# Patient Record
Sex: Male | Born: 1960 | Race: White | Hispanic: No | Marital: Married | Smoking: Never smoker
Health system: Southern US, Community
[De-identification: ages and names within clinical notes are randomized; demographics above are authoritative.]

## PROBLEM LIST (undated history)

## (undated) DIAGNOSIS — I5042 Chronic combined systolic (congestive) and diastolic (congestive) heart failure: Secondary | ICD-10-CM

## (undated) DIAGNOSIS — M1990 Unspecified osteoarthritis, unspecified site: Secondary | ICD-10-CM

## (undated) DIAGNOSIS — L409 Psoriasis, unspecified: Secondary | ICD-10-CM

## (undated) DIAGNOSIS — E7800 Pure hypercholesterolemia, unspecified: Secondary | ICD-10-CM

## (undated) DIAGNOSIS — I509 Heart failure, unspecified: Secondary | ICD-10-CM

## (undated) DIAGNOSIS — I4819 Other persistent atrial fibrillation: Secondary | ICD-10-CM

## (undated) DIAGNOSIS — E669 Obesity, unspecified: Secondary | ICD-10-CM

## (undated) DIAGNOSIS — I480 Paroxysmal atrial fibrillation: Secondary | ICD-10-CM

## (undated) DIAGNOSIS — Z8679 Personal history of other diseases of the circulatory system: Secondary | ICD-10-CM

## (undated) DIAGNOSIS — M109 Gout, unspecified: Secondary | ICD-10-CM

## (undated) DIAGNOSIS — I340 Nonrheumatic mitral (valve) insufficiency: Secondary | ICD-10-CM

## (undated) DIAGNOSIS — G4733 Obstructive sleep apnea (adult) (pediatric): Secondary | ICD-10-CM

## (undated) DIAGNOSIS — F32A Depression, unspecified: Secondary | ICD-10-CM

## (undated) DIAGNOSIS — I10 Essential (primary) hypertension: Secondary | ICD-10-CM

## (undated) DIAGNOSIS — IMO0001 Reserved for inherently not codable concepts without codable children: Secondary | ICD-10-CM

## (undated) DIAGNOSIS — K7460 Unspecified cirrhosis of liver: Secondary | ICD-10-CM

## (undated) DIAGNOSIS — G459 Transient cerebral ischemic attack, unspecified: Secondary | ICD-10-CM

## (undated) DIAGNOSIS — K219 Gastro-esophageal reflux disease without esophagitis: Secondary | ICD-10-CM

## (undated) DIAGNOSIS — F329 Major depressive disorder, single episode, unspecified: Secondary | ICD-10-CM

## (undated) DIAGNOSIS — L509 Urticaria, unspecified: Secondary | ICD-10-CM

## (undated) DIAGNOSIS — Z9889 Other specified postprocedural states: Secondary | ICD-10-CM

## (undated) DIAGNOSIS — R55 Syncope and collapse: Secondary | ICD-10-CM

## (undated) DIAGNOSIS — D696 Thrombocytopenia, unspecified: Secondary | ICD-10-CM

## (undated) HISTORY — DX: Pure hypercholesterolemia, unspecified: E78.00

## (undated) HISTORY — PX: TENDON REPAIR: SHX5111

## (undated) HISTORY — DX: Gout, unspecified: M10.9

## (undated) HISTORY — PX: KNEE ARTHROSCOPY: SHX127

## (undated) HISTORY — DX: Essential (primary) hypertension: I10

## (undated) HISTORY — DX: Syncope and collapse: R55

## (undated) HISTORY — DX: Gastro-esophageal reflux disease without esophagitis: K21.9

## (undated) HISTORY — DX: Unspecified osteoarthritis, unspecified site: M19.90

## (undated) HISTORY — DX: Transient cerebral ischemic attack, unspecified: G45.9

## (undated) HISTORY — DX: Psoriasis, unspecified: L40.9

## (undated) HISTORY — DX: Chronic combined systolic (congestive) and diastolic (congestive) heart failure: I50.42

## (undated) HISTORY — DX: Unspecified cirrhosis of liver: K74.60

## (undated) HISTORY — DX: Thrombocytopenia, unspecified: D69.6

## (undated) HISTORY — DX: Major depressive disorder, single episode, unspecified: F32.9

## (undated) HISTORY — PX: RECONSTRUCTION OF NOSE: SHX2301

## (undated) HISTORY — DX: Paroxysmal atrial fibrillation: I48.0

## (undated) HISTORY — DX: Other persistent atrial fibrillation: I48.19

## (undated) HISTORY — PX: NOSE SURGERY: SHX723

## (undated) HISTORY — DX: Depression, unspecified: F32.A

## (undated) HISTORY — DX: Obstructive sleep apnea (adult) (pediatric): G47.33

## (undated) HISTORY — DX: Urticaria, unspecified: L50.9

## (undated) HISTORY — DX: Obesity, unspecified: E66.9

## (undated) HISTORY — DX: Nonrheumatic mitral (valve) insufficiency: I34.0

---

## 1998-03-21 ENCOUNTER — Ambulatory Visit (HOSPITAL_BASED_OUTPATIENT_CLINIC_OR_DEPARTMENT_OTHER): Admission: RE | Admit: 1998-03-21 | Discharge: 1998-03-21 | Payer: Self-pay | Admitting: *Deleted

## 2009-07-01 HISTORY — PX: CHOLECYSTECTOMY: SHX55

## 2011-04-25 LAB — PULMONARY FUNCTION TEST

## 2012-02-24 ENCOUNTER — Ambulatory Visit (INDEPENDENT_AMBULATORY_CARE_PROVIDER_SITE_OTHER): Payer: BC Managed Care – PPO | Admitting: Internal Medicine

## 2012-02-24 ENCOUNTER — Encounter: Payer: Self-pay | Admitting: *Deleted

## 2012-02-24 ENCOUNTER — Encounter: Payer: Self-pay | Admitting: Internal Medicine

## 2012-02-24 VITALS — BP 128/68 | HR 64 | Ht 75.0 in | Wt 306.0 lb

## 2012-02-24 DIAGNOSIS — R7309 Other abnormal glucose: Secondary | ICD-10-CM

## 2012-02-24 DIAGNOSIS — R739 Hyperglycemia, unspecified: Secondary | ICD-10-CM

## 2012-02-24 DIAGNOSIS — M109 Gout, unspecified: Secondary | ICD-10-CM | POA: Insufficient documentation

## 2012-02-24 DIAGNOSIS — R0602 Shortness of breath: Secondary | ICD-10-CM

## 2012-02-24 DIAGNOSIS — I480 Paroxysmal atrial fibrillation: Secondary | ICD-10-CM

## 2012-02-24 DIAGNOSIS — F411 Generalized anxiety disorder: Secondary | ICD-10-CM

## 2012-02-24 DIAGNOSIS — G4733 Obstructive sleep apnea (adult) (pediatric): Secondary | ICD-10-CM

## 2012-02-24 DIAGNOSIS — D696 Thrombocytopenia, unspecified: Secondary | ICD-10-CM | POA: Insufficient documentation

## 2012-02-24 DIAGNOSIS — E669 Obesity, unspecified: Secondary | ICD-10-CM

## 2012-02-24 DIAGNOSIS — I4891 Unspecified atrial fibrillation: Secondary | ICD-10-CM

## 2012-02-24 DIAGNOSIS — G4730 Sleep apnea, unspecified: Secondary | ICD-10-CM | POA: Insufficient documentation

## 2012-02-24 DIAGNOSIS — L509 Urticaria, unspecified: Secondary | ICD-10-CM

## 2012-02-24 DIAGNOSIS — E7800 Pure hypercholesterolemia, unspecified: Secondary | ICD-10-CM

## 2012-02-24 DIAGNOSIS — G4710 Hypersomnia, unspecified: Secondary | ICD-10-CM

## 2012-02-24 DIAGNOSIS — F419 Anxiety disorder, unspecified: Secondary | ICD-10-CM

## 2012-02-24 NOTE — Patient Instructions (Addendum)

## 2012-02-24 NOTE — Progress Notes (Signed)
 Primary Care Physician: Mike Duran Referring Physician:  Dr Chung ([facility])   Joseph Garrett is a 50 y.o. male with a h/o paroxysmal atrial fibrillation who presents today for EP consultation.  He reports initially being diagnosed with atrial fibrillation 15 years ago.  He reports that at that time, he developed irregular tachypalpitations of abrupt onset.  He presented to [facility] and was found to have afib with RVR.  He declined coumadin and was therefore not cardioverted.  He spontaneously returned to sinus rhythm after several days.  Since that time, he has had increasing frequency and duration of atrial fibrillation (initially a couple times per year).  He finds that stress (emotional or physical) have been triggers for his afib.  He was subsequently evaluated by Dr Torelli and was treated with digoxin and coumadin.  He reports requiring several cardioversions.  He was subsequently placed on flecainide (which he has taken for at least 3 years).  He finds that flecainide will terminate his atrial fibrillation episodes.  Though episodes are less frequent, he continues to have afib.  He has switched from coumadin to xarelto and is quite pleased with this change.  During atrial fibrillation, he reports symptoms of palpitations, fatigue, SOB, and presyncope.  Occasionally, he has to miss work due to afib.  When episodes occur, he frequently has to lay down.  Typically episodes last less than 6 hours.  He will occasionally take an additional flecainide if he is in afib.  In May 2012, he had L facial weakness and was diagnosed with a TIA.   Today, he denies symptoms of  chest pain,  orthopnea, PND, lower extremity edema,  syncope, or neurologic sequela. The patient is tolerating medications without difficulties and is otherwise without complaint today.   Past Medical History  Diagnosis Date  . Paroxysmal atrial fibrillation   . TIA (transient ischemic attack)   . Hypercholesteremia   .  Thrombocytopenia   . OSA (obstructive sleep apnea)     mild, uses CPAP  . Hypertension   . Obesity   . Depression   . Gout   . Hives   . GERD (gastroesophageal reflux disease)   . Cirrhosis     liver scarring on biopsy, presumed to be due to methotrexate  . DJD (degenerative joint disease)   . Psoriasis    Past Surgical History  Procedure Date  . Cholecystectomy 2011    Current Outpatient Prescriptions  Medication Sig Dispense Refill  . amLODipine-olmesartan (AZOR) 5-20 MG per tablet Take 1 tablet by mouth daily.      . beclomethasone (QVAR) 40 MCG/ACT inhaler Inhale 2 puffs into the lungs 2 (two) times daily.      . Febuxostat (ULORIC) 80 MG TABS Take 1 tablet by mouth daily.      . flecainide (TAMBOCOR) 100 MG tablet 1 TAB PO BID      . Omega-3 Fatty Acids (FISH OIL PO) Take 1 tablet by mouth daily.      . Rivaroxaban (XARELTO) 20 MG TABS Take 10 mg by mouth daily.        Allergies not on file  History   Social History  . Marital Status: Married    Spouse Name: N/A    Number of Children: N/A  . Years of Education: N/A   Occupational History  . Not on file.   Social History Main Topics  . Smoking status: Never Smoker   . Smokeless tobacco: Not on file  . Alcohol   Use: No  . Drug Use: No  . Sexually Active: Not on file   Other Topics Concern  . Not on file   Social History Narrative   Lives in [place] with spouse.  3 children are healthy.  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    Family History  Problem Relation Age of Onset  . Heart disease    . Diabetes      ROS- All systems are reviewed and negative except as per the HPI above.  He has chronic decline in hearing and vision.  Physical Exam: Filed Vitals:   02/24/12 1224  BP: 128/68  Pulse: 64  Height: 6' 3" (1.905 m)  Weight: 306 lb (138.801 kg)    GEN- The patient is well appearing, alert and oriented x 3 today.   Head- normocephalic, atraumatic Eyes-  Sclera clear,  conjunctiva pink Ears- hearing intact Oropharynx- clear Neck- supple, no JVP Lymph- no cervical lymphadenopathy Lungs- Clear to ausculation bilaterally, normal work of breathing Heart- Regular rate and rhythm, no murmurs, rubs or gallops, PMI not laterally displaced GI- soft, NT, ND, + BS Extremities- no clubbing, cyanosis, or edema MS- no significant deformity or atrophy Skin- no rash or lesion Psych- euthymic mood, full affect Neuro- strength and sensation are intact  Echo 11/05/11- EF 55-60%, LA size 35mm, trace MR, mild LVH GXT myoview 11/19/11- submaximal exercise, EF 47%, low risk study without obvious ischemia  ekg today reveals sinus rhythm 64 bpm, poor R wave progression, otherwise normal ekg  Assessment and Plan:  

## 2012-02-25 ENCOUNTER — Other Ambulatory Visit: Payer: Self-pay | Admitting: *Deleted

## 2012-02-25 ENCOUNTER — Encounter: Payer: Self-pay | Admitting: *Deleted

## 2012-02-25 DIAGNOSIS — I4891 Unspecified atrial fibrillation: Secondary | ICD-10-CM

## 2012-02-25 DIAGNOSIS — Z01812 Encounter for preprocedural laboratory examination: Secondary | ICD-10-CM

## 2012-03-02 DIAGNOSIS — I4819 Other persistent atrial fibrillation: Secondary | ICD-10-CM | POA: Insufficient documentation

## 2012-03-02 HISTORY — DX: Other persistent atrial fibrillation: I48.19

## 2012-03-02 NOTE — Assessment & Plan Note (Signed)
The patient has symptomatic paroxysmal atrial fibrillation.  He has failed medical therapy with flecainide.  He is appropriately anticoagulated with xarelto. Therapeutic strategies for afib including medicine and ablation were discussed in detail with the patient today. Risk, benefits, and alternatives to EP study and radiofrequency ablation for afib were also discussed in detail today. These risks include but are not limited to stroke, bleeding, vascular damage, tamponade, perforation, damage to the esophagus, lungs, and other structures, pulmonary vein stenosis, worsening renal function, and death. The patient understands these risk and wishes to proceed.  We will therefore proceed with catheter ablation at the next available time.

## 2012-03-02 NOTE — Assessment & Plan Note (Signed)
Compliance with CPAP encouraged 

## 2012-03-02 NOTE — Assessment & Plan Note (Signed)
Weight loss is advised 

## 2012-03-03 ENCOUNTER — Encounter (HOSPITAL_COMMUNITY): Payer: Self-pay | Admitting: Respiratory Therapy

## 2012-03-10 ENCOUNTER — Other Ambulatory Visit: Payer: BC Managed Care – PPO

## 2012-03-16 ENCOUNTER — Encounter (HOSPITAL_COMMUNITY)
Admission: RE | Disposition: A | Discharge status: Home / Self Care | Payer: Self-pay | Source: Ambulatory Visit | Attending: Internal Medicine

## 2012-03-16 ENCOUNTER — Encounter (HOSPITAL_COMMUNITY): Payer: Self-pay | Admitting: *Deleted

## 2012-03-16 ENCOUNTER — Encounter: Payer: Self-pay | Admitting: Internal Medicine

## 2012-03-16 ENCOUNTER — Ambulatory Visit (HOSPITAL_COMMUNITY)
Admission: RE | Admit: 2012-03-16 | Discharge: 2012-03-16 | Disposition: A | Discharge status: Home / Self Care | Payer: BC Managed Care – PPO | Source: Ambulatory Visit | Attending: Internal Medicine | Admitting: Internal Medicine

## 2012-03-16 DIAGNOSIS — I4891 Unspecified atrial fibrillation: Secondary | ICD-10-CM

## 2012-03-16 DIAGNOSIS — I480 Paroxysmal atrial fibrillation: Secondary | ICD-10-CM

## 2012-03-16 HISTORY — PX: TEE WITHOUT CARDIOVERSION: SHX5443

## 2012-03-16 SURGERY — ECHOCARDIOGRAM, TRANSESOPHAGEAL
Anesthesia: Moderate Sedation

## 2012-03-16 MED ORDER — LIDOCAINE VISCOUS 2 % MT SOLN
OROMUCOSAL | Status: AC
  Filled 2012-03-16: qty 15

## 2012-03-16 MED ORDER — LIDOCAINE VISCOUS 2 % MT SOLN
OROMUCOSAL | Status: DC | PRN
  Administered 2012-03-16: 10 mL via OROMUCOSAL

## 2012-03-16 MED ORDER — MIDAZOLAM HCL 10 MG/2ML IJ SOLN
INTRAMUSCULAR | Status: DC | PRN
  Administered 2012-03-16 (×2): 2 mg via INTRAVENOUS
  Administered 2012-03-16: 1 mg via INTRAVENOUS

## 2012-03-16 MED ORDER — MIDAZOLAM HCL 5 MG/ML IJ SOLN
INTRAMUSCULAR | Status: AC
  Filled 2012-03-16: qty 2

## 2012-03-16 MED ORDER — FENTANYL CITRATE 0.05 MG/ML IJ SOLN
INTRAMUSCULAR | Status: AC
  Filled 2012-03-16: qty 2

## 2012-03-16 MED ORDER — SODIUM CHLORIDE 0.9 % IV SOLN: INTRAVENOUS | Status: DC

## 2012-03-16 MED ORDER — FENTANYL CITRATE 0.05 MG/ML IJ SOLN
INTRAMUSCULAR | Status: DC | PRN
  Administered 2012-03-16 (×3): 25 ug via INTRAVENOUS

## 2012-03-16 NOTE — Op Note (Signed)
No LA thrombus.  Full report to follow.

## 2012-03-16 NOTE — Progress Notes (Signed)
  Echocardiogram Echocardiogram Transesophageal has been performed.  Sophya Vanblarcom FRANCES 03/16/2012, 11:02 AM

## 2012-03-16 NOTE — H&P (View-Only) (Signed)
Primary Care Physician: Arnette Felts Referring Physician:  Dr Dory [facility])   Joseph Garrett is a 51 y.o. male with a h/o paroxysmal atrial fibrillation who presents today for EP consultation.  He reports initially being diagnosed with atrial fibrillation 15 years ago.  He reports that at that time, he developed irregular tachypalpitations of abrupt onset.  He presented to [facility] and was found to have afib with RVR.  He declined coumadin and was therefore not cardioverted.  He spontaneously returned to sinus rhythm after several days.  Since that time, he has had increasing frequency and duration of atrial fibrillation (initially a couple times per year).  He finds that stress (emotional or physical) have been triggers for his afib.  He was subsequently evaluated by Dr Shelva Majestic and was treated with digoxin and coumadin.  He reports requiring several cardioversions.  He was subsequently placed on flecainide (which he has taken for at least 3 years).  He finds that flecainide will terminate his atrial fibrillation episodes.  Though episodes are less frequent, he continues to have afib.  He has switched from coumadin to xarelto and is quite pleased with this change.  During atrial fibrillation, he reports symptoms of palpitations, fatigue, SOB, and presyncope.  Occasionally, he has to miss work due to [employer].  When episodes occur, he frequently has to lay down.  Typically episodes last less than 6 hours.  He will occasionally take an additional flecainide if he is in afib.  In May 2012, he had L facial weakness and was diagnosed with a TIA.   Today, he denies symptoms of  chest pain,  orthopnea, PND, lower extremity edema,  syncope, or neurologic sequela. The patient is tolerating medications without difficulties and is otherwise without complaint today.   Past Medical History  Diagnosis Date  . Paroxysmal atrial fibrillation   . TIA (transient ischemic attack)   . Hypercholesteremia   .  Thrombocytopenia   . OSA (obstructive sleep apnea)     mild, uses CPAP  . Hypertension   . Obesity   . Depression   . Gout   . Hives   . GERD (gastroesophageal reflux disease)   . Cirrhosis     liver scarring on biopsy, presumed to be due to methotrexate  . DJD (degenerative joint disease)   . Psoriasis    Past Surgical History  Procedure Date  . Cholecystectomy 2011    Current Outpatient Prescriptions  Medication Sig Dispense Refill  . amLODipine-olmesartan (AZOR) 5-20 MG per tablet Take 1 tablet by mouth daily.      . beclomethasone (QVAR) 40 MCG/ACT inhaler Inhale 2 puffs into the lungs 2 (two) times daily.      . Febuxostat (ULORIC) 80 MG TABS Take 1 tablet by mouth daily.      . flecainide (TAMBOCOR) 100 MG tablet 1 TAB PO BID      . Omega-3 Fatty Acids (FISH OIL PO) Take 1 tablet by mouth daily.      . Rivaroxaban (XARELTO) 20 MG TABS Take 10 mg by mouth daily.        Allergies not on file  History   Social History  . Marital Status: Married    Spouse Name: N/A    Number of Children: N/A  . Years of Education: N/A   Occupational History  . Not on file.   Social History Main Topics  . Smoking status: Never Smoker   . Smokeless tobacco: Not on file  . Alcohol  Use: No  . Drug Use: No  . Sexually Active: Not on file   Other Topics Concern  . Not on file   Social History Narrative   Lives in [place] with spouse.  3 children are healthy.  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    Family History  Problem Relation Age of Onset  . Heart disease    . Diabetes      ROS- All systems are reviewed and negative except as per the HPI above.  He has chronic decline in hearing and vision.  Physical Exam: Filed Vitals:   02/24/12 1224  BP: 128/68  Pulse: 64  Height: 6\' 3"  (1.905 m)  Weight: 306 lb (138.801 kg)    GEN- The patient is well appearing, alert and oriented x 3 today.   Head- normocephalic, atraumatic Eyes-  Sclera clear,  conjunctiva pink Ears- hearing intact Oropharynx- clear Neck- supple, no JVP Lymph- no cervical lymphadenopathy Lungs- Clear to ausculation bilaterally, normal work of breathing Heart- Regular rate and rhythm, no murmurs, rubs or gallops, PMI not laterally displaced GI- soft, NT, ND, + BS Extremities- no clubbing, cyanosis, or edema MS- no significant deformity or atrophy Skin- no rash or lesion Psych- euthymic mood, full affect Neuro- strength and sensation are intact  Echo 11/05/11- EF 55-60%, LA size 35mm, trace MR, mild LVH GXT myoview 11/19/11- submaximal exercise, EF 47%, low risk study without obvious ischemia  ekg today reveals sinus rhythm 64 bpm, poor R wave progression, otherwise normal ekg  Assessment and Plan:

## 2012-03-16 NOTE — Interval H&P Note (Signed)
History and Physical Interval Note:  03/16/2012 10:20 AM  Joseph Garrett  has presented today for surgery, with the diagnosis of afib  The various methods of treatment have been discussed with the patient and family. After consideration of risks, benefits and other options for treatment, the patient has consented to  Procedure(s) (LRB) with comments: TRANSESOPHAGEAL ECHOCARDIOGRAM (TEE) (N/A) as a surgical intervention .  The patient's history has been reviewed, patient examined, no change in status, stable for surgery.  I have reviewed the patient's chart and labs.  Questions were answered to the patient's satisfaction.     [name]

## 2012-03-17 ENCOUNTER — Ambulatory Visit (HOSPITAL_COMMUNITY): Payer: BC Managed Care – PPO | Admitting: Anesthesiology

## 2012-03-17 ENCOUNTER — Encounter (HOSPITAL_COMMUNITY): Payer: Self-pay | Admitting: Anesthesiology

## 2012-03-17 ENCOUNTER — Encounter (HOSPITAL_COMMUNITY)
Admission: RE | Disposition: A | Discharge status: Home / Self Care | Payer: Self-pay | Source: Ambulatory Visit | Attending: Internal Medicine

## 2012-03-17 ENCOUNTER — Encounter (HOSPITAL_COMMUNITY): Payer: Self-pay | Admitting: *Deleted

## 2012-03-17 ENCOUNTER — Ambulatory Visit (HOSPITAL_COMMUNITY)
Admission: RE | Admit: 2012-03-17 | Discharge: 2012-03-18 | Disposition: A | Discharge status: Home / Self Care | Payer: BC Managed Care – PPO | Source: Ambulatory Visit | Attending: Internal Medicine | Admitting: Internal Medicine

## 2012-03-17 DIAGNOSIS — E7800 Pure hypercholesterolemia, unspecified: Secondary | ICD-10-CM | POA: Insufficient documentation

## 2012-03-17 DIAGNOSIS — Z79899 Other long term (current) drug therapy: Secondary | ICD-10-CM | POA: Insufficient documentation

## 2012-03-17 DIAGNOSIS — G4733 Obstructive sleep apnea (adult) (pediatric): Secondary | ICD-10-CM | POA: Insufficient documentation

## 2012-03-17 DIAGNOSIS — K219 Gastro-esophageal reflux disease without esophagitis: Secondary | ICD-10-CM | POA: Insufficient documentation

## 2012-03-17 DIAGNOSIS — I480 Paroxysmal atrial fibrillation: Secondary | ICD-10-CM

## 2012-03-17 DIAGNOSIS — Z8673 Personal history of transient ischemic attack (TIA), and cerebral infarction without residual deficits: Secondary | ICD-10-CM | POA: Insufficient documentation

## 2012-03-17 DIAGNOSIS — G4730 Sleep apnea, unspecified: Secondary | ICD-10-CM | POA: Diagnosis present

## 2012-03-17 DIAGNOSIS — I4892 Unspecified atrial flutter: Secondary | ICD-10-CM

## 2012-03-17 DIAGNOSIS — E669 Obesity, unspecified: Secondary | ICD-10-CM | POA: Insufficient documentation

## 2012-03-17 DIAGNOSIS — I4891 Unspecified atrial fibrillation: Secondary | ICD-10-CM | POA: Insufficient documentation

## 2012-03-17 DIAGNOSIS — L408 Other psoriasis: Secondary | ICD-10-CM | POA: Insufficient documentation

## 2012-03-17 DIAGNOSIS — Z23 Encounter for immunization: Secondary | ICD-10-CM | POA: Insufficient documentation

## 2012-03-17 DIAGNOSIS — I4819 Other persistent atrial fibrillation: Secondary | ICD-10-CM | POA: Diagnosis present

## 2012-03-17 DIAGNOSIS — K7460 Unspecified cirrhosis of liver: Secondary | ICD-10-CM | POA: Insufficient documentation

## 2012-03-17 HISTORY — PX: ATRIAL FIBRILLATION ABLATION: SHX5732

## 2012-03-17 HISTORY — PX: ATRIAL FIBRILLATION ABLATION: SHX5456

## 2012-03-17 LAB — BASIC METABOLIC PANEL
BUN: 21 mg/dL (ref 6–23)
CO2: 28 mEq/L (ref 19–32)
Calcium: 9.8 mg/dL (ref 8.4–10.5)
Chloride: 105 mEq/L (ref 96–112)
Creatinine, Ser: 0.94 mg/dL (ref 0.50–1.35)
Glucose, Bld: 110 mg/dL — ABNORMAL HIGH (ref 70–99)

## 2012-03-17 LAB — CBC
HCT: 43 % (ref 39.0–52.0)
Hemoglobin: 14.9 g/dL (ref 13.0–17.0)
MCH: 30.8 pg (ref 26.0–34.0)
MCV: 89 fL (ref 78.0–100.0)
RBC: 4.83 MIL/uL (ref 4.22–5.81)

## 2012-03-17 LAB — POCT ACTIVATED CLOTTING TIME
Activated Clotting Time: 319 seconds
Activated Clotting Time: 180 seconds

## 2012-03-17 SURGERY — ATRIAL FIBRILLATION ABLATION
Anesthesia: Monitor Anesthesia Care

## 2012-03-17 MED ORDER — FENTANYL CITRATE 0.05 MG/ML IJ SOLN
INTRAMUSCULAR | Status: DC | PRN
  Administered 2012-03-17: 25 ug via INTRAVENOUS
  Administered 2012-03-17: 50 ug via INTRAVENOUS
  Administered 2012-03-17 (×2): 25 ug via INTRAVENOUS
  Administered 2012-03-17: 100 ug via INTRAVENOUS
  Administered 2012-03-17 (×3): 25 ug via INTRAVENOUS

## 2012-03-17 MED ORDER — INFLUENZA VIRUS VACC SPLIT PF IM SUSP
0.5000 mL | INTRAMUSCULAR | Status: AC
  Administered 2012-03-18: 0.5 mL via INTRAMUSCULAR
  Filled 2012-03-17: qty 0.5

## 2012-03-17 MED ORDER — SODIUM CHLORIDE 0.9 % IJ SOLN: 3.0000 mL | INTRAMUSCULAR | Status: DC | PRN

## 2012-03-17 MED ORDER — HYDROCODONE-ACETAMINOPHEN 5-325 MG PO TABS
1.0000 | ORAL_TABLET | ORAL | Status: DC | PRN
  Administered 2012-03-17: 2 via ORAL
  Filled 2012-03-17: qty 2

## 2012-03-17 MED ORDER — SODIUM CHLORIDE 0.9 % IV SOLN: 250.0000 mL | INTRAVENOUS | Status: DC | PRN

## 2012-03-17 MED ORDER — ACETAMINOPHEN 325 MG PO TABS: 650.0000 mg | ORAL_TABLET | ORAL | Status: DC | PRN

## 2012-03-17 MED ORDER — PNEUMOCOCCAL VAC POLYVALENT 25 MCG/0.5ML IJ INJ
0.5000 mL | INJECTION | INTRAMUSCULAR | Status: AC
  Administered 2012-03-18: 0.5 mL via INTRAMUSCULAR
  Filled 2012-03-17: qty 0.5

## 2012-03-17 MED ORDER — ONDANSETRON HCL 4 MG/2ML IJ SOLN: 4.0000 mg | Freq: Four times a day (QID) | INTRAMUSCULAR | Status: DC | PRN

## 2012-03-17 MED ORDER — PROTAMINE SULFATE 10 MG/ML IV SOLN
INTRAVENOUS | Status: DC | PRN
  Administered 2012-03-17: 30 mg via INTRAVENOUS

## 2012-03-17 MED ORDER — BUPIVACAINE HCL (PF) 0.25 % IJ SOLN
INTRAMUSCULAR | Status: AC
  Filled 2012-03-17: qty 30

## 2012-03-17 MED ORDER — HEPARIN SODIUM (PORCINE) 1000 UNIT/ML IJ SOLN
INTRAMUSCULAR | Status: AC
  Filled 2012-03-17: qty 1

## 2012-03-17 MED ORDER — PROPOFOL INFUSION 10 MG/ML OPTIME
INTRAVENOUS | Status: DC | PRN
  Administered 2012-03-17: 75 ug/kg/min via INTRAVENOUS

## 2012-03-17 MED ORDER — LACTATED RINGERS IV SOLN
INTRAVENOUS | Status: DC | PRN
  Administered 2012-03-17: 07:00:00 via INTRAVENOUS

## 2012-03-17 MED ORDER — LIDOCAINE HCL (CARDIAC) 20 MG/ML IV SOLN
INTRAVENOUS | Status: DC | PRN
  Administered 2012-03-17: 50 mg via INTRAVENOUS

## 2012-03-17 MED ORDER — HEPARIN SODIUM (PORCINE) 1000 UNIT/ML IJ SOLN
INTRAMUSCULAR | Status: DC | PRN
  Administered 2012-03-17 (×2): 6000 [IU] via INTRAVENOUS

## 2012-03-17 MED ORDER — HYDROMORPHONE HCL PF 2 MG/ML IJ SOLN: 0.2500 mg | INTRAMUSCULAR | Status: DC | PRN

## 2012-03-17 MED ORDER — MIDAZOLAM HCL 5 MG/5ML IJ SOLN
INTRAMUSCULAR | Status: DC | PRN
  Administered 2012-03-17: 2 mg via INTRAVENOUS

## 2012-03-17 MED ORDER — SODIUM CHLORIDE 0.9 % IJ SOLN
3.0000 mL | Freq: Two times a day (BID) | INTRAMUSCULAR | Status: DC
  Administered 2012-03-17: 3 mL via INTRAVENOUS
  Administered 2012-03-17: 6 mL via INTRAVENOUS

## 2012-03-17 MED ORDER — RIVAROXABAN 20 MG PO TABS
20.0000 mg | ORAL_TABLET | Freq: Every day | ORAL | Status: DC
  Administered 2012-03-17: 20 mg via ORAL
  Filled 2012-03-17 (×2): qty 1

## 2012-03-17 MED ORDER — PROPOFOL 10 MG/ML IV BOLUS
INTRAVENOUS | Status: DC | PRN
  Administered 2012-03-17 (×2): 30 mg via INTRAVENOUS

## 2012-03-17 MED ORDER — SODIUM CHLORIDE 0.9 % IV SOLN: INTRAVENOUS | Status: DC

## 2012-03-17 MED ORDER — HYDROXYUREA 500 MG PO CAPS
ORAL_CAPSULE | ORAL | Status: AC
  Filled 2012-03-17: qty 1

## 2012-03-17 NOTE — Progress Notes (Signed)
Utilization Review Completed.Joseph Garrett T9/17/2013

## 2012-03-17 NOTE — Op Note (Signed)
SURGEON:  Hillis Range, MD  PREPROCEDURE DIAGNOSES: 1. Paroxysmal atrial fibrillation.  POSTPROCEDURE DIAGNOSES: 1. Paroxysmal  atrial fibrillation. 2. Right atrial flutter  PROCEDURES: 1. Comprehensive electrophysiologic study. 2. Coronary sinus pacing and recording. 3. Three-dimensional mapping of atrial fibrillation with additional mapping of a second discrete atrial focus (atrial flutter) 4. Ablation of atrial fibrillation with additional ablation of a second discrete atrial focus (atrial flutter) 5. Intracardiac echocardiography. 6. Transseptal puncture of an intact septum. 7. Rotational Angiography with processing at an independent workstation 8. Arrhythmia induction with pacing with isuprel infusion 9. External cardioversion  INTRODUCTION:  Joseph Garrett is a 51 y.o. male with a history of paroxysmal atrial fibrillation who now presents for EP study and radiofrequency ablation.  The patient reports initially being diagnosed with atrial fibrillation 15 years ago after presenting with symptomatic palpitations and fatgiue. The patient reports increasing frequency and duration of atrial arrhythmias since that time.  The patient has failed medical therapy with flecainide.  The patient therefore presents today for catheter ablation.  DESCRIPTION OF PROCEDURE:  Informed written consent was obtained, and the patient was brought to the electrophysiology lab in a fasting state.  The patient was adequately sedated with intravenous medications as outlined in the anesthesia report.  The patient's left and right groins were prepped and draped in the usual sterile fashion by the EP lab staff.  Using a percutaneous Seldinger technique, two 7-French and one 11-French hemostasis sheaths were placed into the right common femoral vein.   3 Dimensional Rotational Angiography: A 5 french pigtail catheter was introduced through the right common femoral vein and advanced into the inferior venocava.  3  demential rotational angiography was then performed by power injection of 100cc of nonionic contrast.  Reprocessing at an independent work station was then performed.   This demonstrated a moderate sized left atrium with 4 separate pulmonary veins which were also moderate in size.  There were no anomalous veins or significant abnormalities.  A 3 dimensional rendering of the left atrium was then merged using [employer] onto the Well[employer] system and registered with intracardiac echo (see below).  The pigtail catheter was then removed.  Catheter Placement:  A 7-French Biosense Webster Decapolar coronary sinus catheter was introduced through the right common femoral vein and advanced into the coronary sinus for recording and pacing from this location.  A 6-French quadripolar Josephson catheter was introduced through the right common femoral vein and advanced into the right ventricle for recording and pacing.  This catheter was then pulled back to the His bundle location.    Initial Measurements: The patient presented to the electrophysiology lab in sinus rhythm.  His PR interval measured 184 with a QRS duringation of 95 and a QT interval of 453 msec.  The AH interval measured 113 and the HV interval measured 70 msec.     Intracardiac Echocardiography: A 10-French Biosense Webster AcuNav intracardiac echocardiography catheter was introduced through the left common femoral vein and advanced into the right atrium. Intracardiac echocardiography was performed of the left atrium, and a three-dimensional anatomical rendering of the left atrium was performed using CARTO sound technology.  The patient was noted to have a moderate sized left atrium.  The interatrial septum was prominent but not aneurysmal. All 4 pulmonary veins were visualized and noted to have separate ostia.  The pulmonary veins were moderate in size.  The left atrial appendage was visualized and did not reveal thrombus.   There was no  evidence  of pulmonary vein stenosis.   Transseptal Puncture: The middle right common femoral vein sheath was exchanged for an 8.5 [place] SL2 transseptal sheath and transseptal access was achieved in a standard fashion using a Brockenbrough needle under biplane fluoroscopy with intracardiac echocardiography confirmation of the transseptal puncture.  Once transseptal access had been achieved, heparin was administered intravenously and intra- arterially in order to maintain an ACT of greater than 300 seconds throughout the procedure.   3D Mapping and Ablation: The His bundle catheter was removed and in its place a 3.5 mm Biosense Webster EZ [employer] ablation catheter was advanced into the right atrium.  The transseptal sheath was pulled back into the IVC over a guidewire.  The ablation catheter was advanced across the transseptal hole using the wire as a guide.  The transseptal sheath was then re-advanced over the guidewire into the left atrium.  A duodecapolar Biosense Webster circular mapping catheter was introduced through the transseptal sheath and positioned over the mouth of all 4 pulmonary veins.  Three-dimensional electroanatomical mapping was performed using CARTO technology.  This demonstrated electrical activity within all four pulmonary veins at baseline. The patient underwent successful sequential electrical isolation and anatomical encircling of all four pulmonary veins using radiofrequency current with a circular mapping catheter as a guide. During ablation within the right superior and left superior pulmonary vein, the patient developed transient atrial fibrillation.  Atrial fibrillation organized into right atrial flutter with a cycle length of .  Surface ekg revealed typical appearing atrial flutter.  Atrial flutter terminated before entrainment could be performed. The ablation catheter was then pulled back into the right atrium and positioned along the cavo-tricuspid isthmus.   Mapping along the atrial side of the isthmus was performed.  This demonstrated a standard isthmus.  A series of radiofrequency applications were then delivered along the isthmus. The patient developed sustained atrial fibrillation.  He then underwent successful cardioversion with cardioversion electrodes in the anterior/ posterior configuration.  A single synchronized 360J shock was successful in converting the patient to sinus rhythm.  Complete bidirectional cavotricuspid isthmus block was achieved as confirmed by differential atrial pacing from the low lateral right atrium.  A stimulus to earliest atrial activation across the isthmus measured 170 msec bi-directionally.  The patient was observe without return of conduction through the isthmus.  Measurements Following Ablation: Following ablation, Isuprel was infused up to 20 mcg/min with no inducible atrial fibrillation, atrial tachycardia, atrial flutter, or sustained PACs. In sinus rhythm with RR interval was 916, with PR , QRS 110 msec, and Qtc 453 msec.  Following ablation the AH interval measured with an HV interval of 65 msec. Ventricular pacing was performed, which revealed midline decremental VA conduction with a VA Wenckebach cycle length of 420 msec.  Rapid atrial pacing was performed, which revealed an AV Wenckebach cycle length of 360 msec with no infrahisian block observed.  Electroisolation was then again confirmed in all four pulmonary veins.  The procedure was therefore considered completed.  All catheters were removed, and the sheaths were aspirated and flushed.  The patient was transferred to the recovery area for sheath removal per protocol.  A limited bedside transthoracic echocardiogram revealed no pericardial effusion.  There were no early apparent complications.  CONCLUSIONS: 1. Sinus rhythm upon presentation.   2. Rotational Angiography reveals a moderate sized left atrium with four separate pulmonary veins without  evidence of pulmonary vein stenosis. 3. Successful electrical isolation and anatomical encircling of all four pulmonary veins with  radiofrequency current. 4. Cavo-tricuspid isthmus ablation was performed with complete bidirectional isthmus block achieved.  5. No inducible arrhythmias following ablation both on and off of Isuprel 6. No early apparent complications.   Fayrene Fearing Mariea Mcmartin,MD 11:24 AM 03/17/2012

## 2012-03-17 NOTE — Anesthesia Preprocedure Evaluation (Addendum)
Anesthesia Evaluation  Patient identified by MRN, date of birth, ID band Patient awake    Reviewed: Allergy & Precautions, H&P , NPO status , Patient's Chart, lab work & pertinent test results, reviewed documented beta blocker date and time   History of Anesthesia Complications Negative for: history of anesthetic complications  Airway Mallampati: III  Neck ROM: full    Dental  (+) Teeth Intact and Dental Advisory Given   Pulmonary shortness of breath, sleep apnea and Continuous Positive Airway Pressure Ventilation ,          Cardiovascular hypertension, Pt. on medications + dysrhythmias Atrial Fibrillation     Neuro/Psych PSYCHIATRIC DISORDERS Anxiety Depression TIA   GI/Hepatic GERD-  ,  Endo/Other  Morbid obesity  Renal/GU      Musculoskeletal   Abdominal   Peds  Hematology   Anesthesia Other Findings   Reproductive/Obstetrics                          Anesthesia Physical Anesthesia Plan  ASA: III  Anesthesia Plan: MAC   Post-op Pain Management:    Induction: Intravenous  Airway Management Planned: Simple Face Mask  Additional Equipment:   Intra-op Plan:   Post-operative Plan:   Informed Consent: I have reviewed the patients History and Physical, chart, labs and discussed the procedure including the risks, benefits and alternatives for the proposed anesthesia with the patient or authorized representative who has indicated his/her understanding and acceptance.   Dental advisory given  Plan Discussed with: CRNA and Surgeon  Anesthesia Plan Comments:       Anesthesia Quick Evaluation

## 2012-03-17 NOTE — H&P (View-Only) (Signed)
 Primary Care Physician: Mike Duran Referring Physician:  Dr Chung ([facility])   Joseph Garrett is a 51 y.o. male with a h/o paroxysmal atrial fibrillation who presents today for EP consultation.  He reports initially being diagnosed with atrial fibrillation 15 years ago.  He reports that at that time, he developed irregular tachypalpitations of abrupt onset.  He presented to [facility] and was found to have afib with RVR.  He declined coumadin and was therefore not cardioverted.  He spontaneously returned to sinus rhythm after several days.  Since that time, he has had increasing frequency and duration of atrial fibrillation (initially a couple times per year).  He finds that stress (emotional or physical) have been triggers for his afib.  He was subsequently evaluated by Dr Torelli and was treated with digoxin and coumadin.  He reports requiring several cardioversions.  He was subsequently placed on flecainide (which he has taken for at least 3 years).  He finds that flecainide will terminate his atrial fibrillation episodes.  Though episodes are less frequent, he continues to have afib.  He has switched from coumadin to xarelto and is quite pleased with this change.  During atrial fibrillation, he reports symptoms of palpitations, fatigue, SOB, and presyncope.  Occasionally, he has to miss work due to afib.  When episodes occur, he frequently has to lay down.  Typically episodes last less than 6 hours.  He will occasionally take an additional flecainide if he is in afib.  In May 2012, he had L facial weakness and was diagnosed with a TIA.   Today, he denies symptoms of  chest pain,  orthopnea, PND, lower extremity edema,  syncope, or neurologic sequela. The patient is tolerating medications without difficulties and is otherwise without complaint today.   Past Medical History  Diagnosis Date  . Paroxysmal atrial fibrillation   . TIA (transient ischemic attack)   . Hypercholesteremia   .  Thrombocytopenia   . OSA (obstructive sleep apnea)     mild, uses CPAP  . Hypertension   . Obesity   . Depression   . Gout   . Hives   . GERD (gastroesophageal reflux disease)   . Cirrhosis     liver scarring on biopsy, presumed to be due to methotrexate  . DJD (degenerative joint disease)   . Psoriasis    Past Surgical History  Procedure Date  . Cholecystectomy 2011    Current Outpatient Prescriptions  Medication Sig Dispense Refill  . amLODipine-olmesartan (AZOR) 5-20 MG per tablet Take 1 tablet by mouth daily.      . beclomethasone (QVAR) 40 MCG/ACT inhaler Inhale 2 puffs into the lungs 2 (two) times daily.      . Febuxostat (ULORIC) 80 MG TABS Take 1 tablet by mouth daily.      . flecainide (TAMBOCOR) 100 MG tablet 1 TAB PO BID      . Omega-3 Fatty Acids (FISH OIL PO) Take 1 tablet by mouth daily.      . Rivaroxaban (XARELTO) 20 MG TABS Take 10 mg by mouth daily.        Allergies not on file  History   Social History  . Marital Status: Married    Spouse Name: N/A    Number of Children: N/A  . Years of Education: N/A   Occupational History  . Not on file.   Social History Main Topics  . Smoking status: Never Smoker   . Smokeless tobacco: Not on file  . Alcohol   Use: No  . Drug Use: No  . Sexually Active: Not on file   Other Topics Concern  . Not on file   Social History Narrative   Lives in [place] with spouse.  3 children are healthy.  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    Family History  Problem Relation Age of Onset  . Heart disease    . Diabetes      ROS- All systems are reviewed and negative except as per the HPI above.  He has chronic decline in hearing and vision.  Physical Exam: Filed Vitals:   02/24/12 1224  BP: 128/68  Pulse: 64  Height: 6' 3" (1.905 m)  Weight: 306 lb (138.801 kg)    GEN- The patient is well appearing, alert and oriented x 3 today.   Head- normocephalic, atraumatic Eyes-  Sclera clear,  conjunctiva pink Ears- hearing intact Oropharynx- clear Neck- supple, no JVP Lymph- no cervical lymphadenopathy Lungs- Clear to ausculation bilaterally, normal work of breathing Heart- Regular rate and rhythm, no murmurs, rubs or gallops, PMI not laterally displaced GI- soft, NT, ND, + BS Extremities- no clubbing, cyanosis, or edema MS- no significant deformity or atrophy Skin- no rash or lesion Psych- euthymic mood, full affect Neuro- strength and sensation are intact  Echo 11/05/11- EF 55-60%, LA size 35mm, trace MR, mild LVH GXT myoview 11/19/11- submaximal exercise, EF 47%, low risk study without obvious ischemia  ekg today reveals sinus rhythm 64 bpm, poor R wave progression, otherwise normal ekg  Assessment and Plan:  

## 2012-03-17 NOTE — Preoperative (Signed)
Beta Blockers   Reason not to administer Beta Blockers:Not Applicable. No home beta blockers 

## 2012-03-17 NOTE — Anesthesia Postprocedure Evaluation (Signed)
Anesthesia Post Note  Patient: Joseph Garrett  Procedure(s) Performed: Procedure(s) (LRB): ATRIAL FIBRILLATION ABLATION (N/A)  Anesthesia type: MAC  Patient location: PACU  Post pain: Pain level controlled and Adequate analgesia  Post assessment: Post-op Vital signs reviewed, Patient's Cardiovascular Status Stable and Respiratory Function Stable  Last Vitals:  Filed Vitals:   03/17/12 0550  BP: 119/84  Pulse: 93  Temp: 36.1 C  Resp: 18    Post vital signs: Reviewed and stable  Level of consciousness: awake, alert  and oriented  Complications: No apparent anesthesia complications

## 2012-03-17 NOTE — Progress Notes (Signed)
RT inspected pt. Home cpap unit. No frayed cords or wires. cpap appears to be intact . Pt. States he can place himself on at his convenience. Pt. Does wear cpap at home.

## 2012-03-17 NOTE — Transfer of Care (Signed)
Immediate Anesthesia Transfer of Care Note  Patient: Joseph Garrett  Procedure(s) Performed: Procedure(s) (LRB) with comments: ATRIAL FIBRILLATION ABLATION (N/A)  Patient Location: Cath Lab  Anesthesia Type: MAC  Level of Consciousness: awake and alert   Airway & Oxygen Therapy: Patient Spontanous Breathing and Patient connected to nasal cannula oxygen  Post-op Assessment: Report given to PACU RN and Post -op Vital signs reviewed and stable  Post vital signs: Reviewed  Complications: No apparent anesthesia complications

## 2012-03-17 NOTE — Interval H&P Note (Signed)
History and Physical Interval Note:  03/17/2012 7:20 AM  Joseph Garrett  has presented today for surgery, with the diagnosis of afib  The various methods of treatment have been discussed with the patient and family. After consideration of risks, benefits and other options for treatment, the patient has consented to  Procedure(s) (LRB) with comments: ATRIAL FIBRILLATION ABLATION (N/A) as a surgical intervention .  The patient's history has been reviewed, patient examined, no change in status, stable for surgery.  I have reviewed the patient's chart and labs.  Questions were answered to the patient's satisfaction.     Hillis Range

## 2012-03-18 DIAGNOSIS — I4891 Unspecified atrial fibrillation: Secondary | ICD-10-CM

## 2012-03-18 MED ORDER — PANTOPRAZOLE SODIUM 40 MG PO TBEC: 40.0000 mg | DELAYED_RELEASE_TABLET | Freq: Every day | ORAL | Status: DC

## 2012-03-18 NOTE — Discharge Summary (Signed)
ELECTROPHYSIOLOGY DISCHARGE SUMMARY    Patient ID: Joseph Garrett,  MRN: [id number], DOB/AGE: 1960-11-01 51 y.o.  Admit date: 03/17/2012 Discharge date: 03/18/2012  Primary Care Physician: Joseph Felts, PA Primary Cardiologist: Joseph Peru, MD [facility])  Primary Discharge Diagnosis:  1. Paroxysmal AFib s/p AFib ablation 03/17/2012  Secondary Discharge Diagnoses:  1. TIA 2. Dyslipidemia 3. OSA 4. HTN 5. Obesity 6. GERD 7. Cirrhosis, with liver scarring by biopsy, felt to be due to MTX (methotrexate) treatment 8. Psoriasis 9. Depression  Procedures This Admission:   1. Comprehensive electrophysiologic study.  2. Coronary sinus pacing and recording.  3. Three-dimensional mapping of atrial fibrillation with additional mapping of a second discrete atrial focus (atrial flutter)  4. Ablation of atrial fibrillation with additional ablation of a second discrete atrial focus (atrial flutter)  5. Intracardiac echocardiography.  6. Transseptal puncture of an intact septum.  7. Rotational Angiography with processing at an independent workstation  8. Arrhythmia induction with pacing with isuprel infusion  9. External cardioversion CONCLUSIONS:  1. Sinus rhythm upon presentation.  2. Rotational Angiography reveals a moderate sized left atrium with four separate pulmonary veins without evidence of pulmonary vein stenosis.  3. Successful electrical isolation and anatomical encircling of all four pulmonary veins with radiofrequency current.  4. Cavo-tricuspid isthmus ablation was performed with complete bidirectional isthmus block achieved.  5. No inducible arrhythmias following ablation both on and off of Isuprel  6. No early apparent complications.   History and Hospital Course:  Please see Dr. Jenel Garrett dictated office note for full details of history. Mr. Joseph Garrett is a 51 year old gentleman with paroxysmal AFib who presented yesterday for EP study +RF ablation for AFib. He tolerated  this procedure well without any immediate complication. He remains hemodynamically stable and afebrile. His groin site is intact without significant bleeding or hematoma. He has been given discharge instructions including wound care and activity restrictions. He will stop flecainide and continue Xarelto. He will take Protonix 40 mg daily for 6 weeks. There were no other changes made to his medications. He will follow-up in clinic in 12 weeks. She has been seen, examined and deemed stable for discharge today by Dr. Hillis Garrett.  Discharge Vitals: Blood pressure 117/43, pulse 64, temperature 98.9 F (37.2 C), temperature source Oral, resp. rate 18, height 6\' 2"  (1.88 m), weight 310 lb 13.6 oz (141 kg), SpO2 95.00%.   Labs: Lab Results  Component Value Date   WBC 5.1 03/17/2012   HGB 14.9 03/17/2012   HCT 43.0 03/17/2012   MCV 89.0 03/17/2012   PLT 182 03/17/2012    Lab 03/17/12 0546  NA 141  K 4.1  CL 105  CO2 28  BUN 21  CREATININE 0.94  CALCIUM 9.8  PROT --  BILITOT --  ALKPHOS --  ALT --  AST --  GLUCOSE 110*   Basename 03/17/12 0546  INR 1.39    Disposition:  The patient is being discharged in stable condition.  Follow-up: Follow-up Information    Follow up with Joseph Range, MD. On 06/03/2012. (At 11:00 AM)    Contact information:   [address] [place] [address] [phone]   Discharge Medications:    Medication List     As of 03/18/2012  9:14 AM    STOP taking these medications         flecainide 100 MG tablet   Commonly known as: TAMBOCOR      TAKE these medications  AZOR 5-20 MG per tablet   Generic drug: amLODipine-olmesartan   Take 1 tablet by mouth daily.      beclomethasone 40 MCG/ACT inhaler   Commonly known as: QVAR   Inhale 2 puffs into the lungs every morning.      cetirizine 10 MG tablet   Commonly known as: ZYRTEC   Take 10 mg by mouth daily.      Fish Oil 300 MG Caps   Take 1,200 mg by mouth daily.       pantoprazole 40 MG tablet   Commonly known as: PROTONIX   Take 1 tablet (40 mg total) by mouth daily.      ULORIC 80 MG Tabs   Generic drug: Febuxostat   Take 80 mg by mouth daily.      XARELTO 20 MG Tabs   Generic drug: Rivaroxaban   Take 20 mg by mouth daily.      Duration of Discharge Encounter: Greater than 30 minutes including physician time.  Limmie Patricia, PA-C 03/18/2012, 9:14 AM   Joseph Range, MD

## 2012-03-18 NOTE — Progress Notes (Signed)
   SUBJECTIVE: The patient is doing well today.  At this time, he denies chest pain, shortness of breath, or any new concerns.       . hydroxyurea      . influenza  inactive virus vaccine  0.5 mL Intramuscular Tomorrow-1000  . pneumococcal 23 valent vaccine  0.5 mL Intramuscular Tomorrow-1000  . Rivaroxaban  20 mg Oral QPC supper  . sodium chloride  3 mL Intravenous Q12H      . DISCONTD: sodium chloride      OBJECTIVE: Physical Exam: Filed Vitals:   03/18/12 0000 03/18/12 0200 03/18/12 0400 03/18/12 0730  BP: 124/70 106/58 98/46 117/43  Pulse: 64 58 57 64  Temp: 98.3 F (36.8 C)  98.2 F (36.8 C) 98.9 F (37.2 C)  TempSrc: Oral  Oral Oral  Resp: 16 18 16 18   Height:      Weight:      SpO2: 95% 98% 97% 95%    Intake/Output Summary (Last 24 hours) at 03/18/12 0819 Last data filed at 03/18/12 0400  Gross per 24 hour  Intake   1636 ml  Output   1800 ml  Net   -164 ml    Telemetry reveals sinus rhythm  GEN- The patient is well appearing, alert and oriented x 3 today.   Head- normocephalic, atraumatic Eyes-  Sclera clear, conjunctiva pink Ears- hearing intact Oropharynx- clear Neck- supple, no JVP Lymph- no cervical lymphadenopathy Lungs- Clear to ausculation bilaterally, normal work of breathing Heart- Regular rate and rhythm, no murmurs, rubs or gallops, PMI not laterally displaced GI- soft, NT, ND, + BS Extremities- no clubbing, cyanosis, or edema, no hematoma/ bruit Skin- no rash or lesion Psych- euthymic mood, full affect Neuro- strength and sensation are intact  LABS: Basic Metabolic Panel:  Basename 03/17/12 0546  NA 141  K 4.1  CL 105  CO2 28  GLUCOSE 110*  BUN 21  CREATININE 0.94  CALCIUM 9.8  MG --  PHOS --   Liver Function Tests: No results found for this basename: AST:2,ALT:2,ALKPHOS:2,BILITOT:2,PROT:2,ALBUMIN:2 in the last 72 hours No results found for this basename: LIPASE:2,AMYLASE:2 in the last 72 hours CBC:  Basename 03/17/12  0546  WBC 5.1  NEUTROABS --  HGB 14.9  HCT 43.0  MCV 89.0  PLT 182   ekg today reveals sinus 58 bpm, normal ekg  ASSESSMENT AND PLAN:  Active Problems:  Sleep apnea  Paroxysmal atrial fibrillation  1. afib- doing well s/p ablation Stop flecainide Continue xarelto Add protonix 40mg  daily x 6 weeks  DC to home Follow-up with me in 3 months  Hillis Range, MD 03/18/2012 8:19 AM

## 2012-03-24 ENCOUNTER — Telehealth: Payer: Self-pay | Admitting: Internal Medicine

## 2012-03-24 NOTE — Telephone Encounter (Signed)
Walk In Pt Form " Pt Dropped Off [employer]" paper to be  Completed sending to [facility] 03/24/12/KM

## 2012-04-09 ENCOUNTER — Telehealth: Payer: Self-pay | Admitting: Internal Medicine

## 2012-04-09 NOTE — Telephone Encounter (Signed)
Millie informed me that the pt dropped off a claim form from ablation one week ago, she is inquiring on where it is. I told [employer] rep I would pass information along. I will route to nurse and medical records

## 2012-04-09 NOTE — Telephone Encounter (Signed)
plz return call to Aldean Jewett [facility] Agent  [phone] regarding patient's claim form

## 2012-04-10 NOTE — Telephone Encounter (Signed)
Sickness Claim Form & [employer] Claim papers were Sent to [facility] For Processing 03/24/12.. I will call and get Status of these papers

## 2012-04-21 NOTE — Addendum Note (Signed)
Addended by: Marrion Coy L on: 04/21/2012 02:27 PM   Modules accepted: Orders

## 2012-06-03 ENCOUNTER — Encounter: Payer: Self-pay | Admitting: Internal Medicine

## 2012-06-03 ENCOUNTER — Ambulatory Visit (INDEPENDENT_AMBULATORY_CARE_PROVIDER_SITE_OTHER): Payer: BC Managed Care – PPO | Admitting: Internal Medicine

## 2012-06-03 VITALS — BP 106/68 | HR 62 | Ht 74.0 in | Wt 306.0 lb

## 2012-06-03 DIAGNOSIS — I480 Paroxysmal atrial fibrillation: Secondary | ICD-10-CM

## 2012-06-03 DIAGNOSIS — I4891 Unspecified atrial fibrillation: Secondary | ICD-10-CM

## 2012-06-03 NOTE — Patient Instructions (Signed)
Your physician recommends that you schedule a follow-up appointment in 3 months with Dr Allred    

## 2012-06-03 NOTE — Progress Notes (Signed)
Referring:  Joseph Garrett is a 51 y.o. male who presents today for routine electrophysiology followup.  Since his afib ablation, the patient reports doing very well.  He denies procedure related complications.  He has had ERAF, though this is much improved recently.  Today, he denies symptoms of chest pain, shortness of breath, dizziness, presyncope, or syncope.  The patient is otherwise without complaint today.   Past Medical History  Diagnosis Date  . Paroxysmal atrial fibrillation   . TIA (transient ischemic attack)   . Hypercholesteremia   . Thrombocytopenia   . OSA (obstructive sleep apnea)     mild, uses CPAP  . Hypertension   . Obesity   . Depression   . Gout   . Hives   . GERD (gastroesophageal reflux disease)   . Cirrhosis     liver scarring on biopsy, presumed to be due to methotrexate  . DJD (degenerative joint disease)   . Psoriasis    Past Surgical History  Procedure Date  . Cholecystectomy 2011  . Nose surgery     Turbinates  . Reconstruction of nose   . Tee without cardioversion 03/16/2012    Procedure: TRANSESOPHAGEAL ECHOCARDIOGRAM (TEE);  Surgeon: [name], MD;  Location: [facility] ENDOSCOPY;  Service: Cardiovascular;  Laterality: N/A;  . Atrial fibrillation ablation 03/17/12    PVI and CTI ablation by Dr [name]    Current Outpatient Prescriptions  Medication Sig Dispense Refill  . amLODipine-olmesartan (AZOR) 5-20 MG per tablet Take 1 tablet by mouth daily.      . beclomethasone (QVAR) 40 MCG/ACT inhaler Inhale 2 puffs into the lungs every morning.       . Rivaroxaban (XARELTO) 20 MG TABS Take 20 mg by mouth daily.       . cetirizine (ZYRTEC) 10 MG tablet Take 10 mg by mouth daily.      . Febuxostat (ULORIC) 80 MG TABS Take 80 mg by mouth daily.       . Omega-3 Fatty Acids (FISH OIL) 300 MG CAPS Take 1,200 mg by mouth daily.      . pantoprazole (PROTONIX) 40 MG tablet Take 1 tablet (40 mg total) by mouth daily.  30 tablet  1    Physical  Exam: Filed Vitals:   06/03/12 1110  BP: 106/68  Pulse: 62  Height: 6\' 2"  (1.88 m)  Weight: 306 lb (138.801 kg)  SpO2: 98%    GEN- The patient is well appearing, alert and oriented x 3 today.   Head- normocephalic, atraumatic Eyes-  Sclera clear, conjunctiva pink Ears- hearing intact Oropharynx- clear Lungs- Clear to ausculation bilaterally, normal work of breathing Heart- Regular rate and rhythm, no murmurs, rubs or gallops, PMI not laterally displaced GI- soft, NT, ND, + BS Extremities- no clubbing, cyanosis, or edema  ekg today reveals sinus rhythm 62 bpm, otherwise normal ekg  Assessment and Plan:

## 2012-06-03 NOTE — Assessment & Plan Note (Signed)
Doing well s/p ablation Has had some ERAF post ablation, though he remains within the 3 month healing phase.  No changes at this time  Given prior TIA and HTN, he should remain on anticoagulation long term  Return in 3 months

## 2012-09-10 ENCOUNTER — Encounter: Payer: Self-pay | Admitting: *Deleted

## 2012-09-10 ENCOUNTER — Encounter: Payer: Self-pay | Admitting: Internal Medicine

## 2012-09-10 ENCOUNTER — Ambulatory Visit (INDEPENDENT_AMBULATORY_CARE_PROVIDER_SITE_OTHER): Payer: BC Managed Care – PPO | Admitting: Internal Medicine

## 2012-09-10 VITALS — BP 126/83 | HR 122 | Ht 74.5 in | Wt 304.6 lb

## 2012-09-10 DIAGNOSIS — I4819 Other persistent atrial fibrillation: Secondary | ICD-10-CM

## 2012-09-10 DIAGNOSIS — I4891 Unspecified atrial fibrillation: Secondary | ICD-10-CM

## 2012-09-10 DIAGNOSIS — G4733 Obstructive sleep apnea (adult) (pediatric): Secondary | ICD-10-CM

## 2012-09-10 DIAGNOSIS — I5021 Acute systolic (congestive) heart failure: Secondary | ICD-10-CM | POA: Insufficient documentation

## 2012-09-10 DIAGNOSIS — I509 Heart failure, unspecified: Secondary | ICD-10-CM

## 2012-09-10 LAB — CBC WITH DIFFERENTIAL/PLATELET
Basophils Relative: 0.1 % (ref 0.0–3.0)
Eosinophils Absolute: 0.2 10*3/uL (ref 0.0–0.7)
HCT: 42.3 % (ref 39.0–52.0)
Hemoglobin: 14.3 g/dL (ref 13.0–17.0)
Lymphocytes Relative: 21.8 % (ref 12.0–46.0)
Lymphs Abs: 1.1 10*3/uL (ref 0.7–4.0)
MCHC: 33.9 g/dL (ref 30.0–36.0)
Monocytes Relative: 10.4 % (ref 3.0–12.0)
Neutro Abs: 3.3 10*3/uL (ref 1.4–7.7)
RBC: 4.63 Mil/uL (ref 4.22–5.81)

## 2012-09-10 LAB — BASIC METABOLIC PANEL
BUN: 21 mg/dL (ref 6–23)
CO2: 24 mEq/L (ref 19–32)
Calcium: 9.1 mg/dL (ref 8.4–10.5)
Chloride: 108 mEq/L (ref 96–112)
Creatinine, Ser: 1.1 mg/dL (ref 0.4–1.5)

## 2012-09-10 MED ORDER — CARVEDILOL 6.25 MG PO TABS: 6.2500 mg | ORAL_TABLET | Freq: Every day | ORAL | Status: DC

## 2012-09-10 NOTE — Addendum Note (Signed)
Addended by: Dennis Bast F on: 09/10/2012 10:27 AM   Modules accepted: Orders

## 2012-09-10 NOTE — Progress Notes (Signed)
PCP:  [name], MD Primary Cardiologist:  Dr [name] [facility])  The patient presents today for routine electrophysiology followup.  Since last being seen in our clinic, the patient has developed recurrent afib.  He reports increasing frequency of afib over the past 6 weeks.  He was seen by Dr [name] and had an echo which revealed EF 30% with mild MR and normal LA size.  He has had a low risk myoview also.  He reports fatigue and decreased exercise tolerance with occasional palpitations.  Today, he denies symptoms of chest pain, shortness of breath, orthopnea, PND, lower extremity edema, dizziness, presyncope, syncope, or neurologic sequela.  The patient feels that he is tolerating medications without difficulties and is otherwise without complaint today.   Past Medical History  Diagnosis Date  . Paroxysmal atrial fibrillation   . TIA (transient ischemic attack)   . Hypercholesteremia   . Thrombocytopenia   . OSA (obstructive sleep apnea)     mild, uses CPAP  . Hypertension   . Obesity   . Depression   . Gout   . Hives   . GERD (gastroesophageal reflux disease)   . Cirrhosis     liver scarring on biopsy, presumed to be due to methotrexate  . DJD (degenerative joint disease)   . Psoriasis    Past Surgical History  Procedure Laterality Date  . Cholecystectomy  2011  . Nose surgery      Turbinates  . Reconstruction of nose    . Tee without cardioversion  03/16/2012    Procedure: TRANSESOPHAGEAL ECHOCARDIOGRAM (TEE);  Surgeon: [name], MD;  Location: [facility] ENDOSCOPY;  Service: Cardiovascular;  Laterality: N/A;  . Atrial fibrillation ablation  03/17/12    PVI and CTI ablation by Dr [name]    Current Outpatient Prescriptions  Medication Sig Dispense Refill  . beclomethasone (QVAR) 40 MCG/ACT inhaler Inhale 2 puffs into the lungs every morning.       . carvedilol (COREG) 3.125 MG tablet Take 3.125 mg by mouth daily.      . cetirizine (ZYRTEC) 10 MG tablet Take  10 mg by mouth daily.      . Febuxostat (ULORIC) 80 MG TABS Take 80 mg by mouth daily.       Marland Kitchen lisinopril (PRINIVIL,ZESTRIL) 5 MG tablet Take 5 mg by mouth daily.      . Omega-3 Fatty Acids (FISH OIL) 300 MG CAPS Take 1,200 mg by mouth daily.      . pantoprazole (PROTONIX) 40 MG tablet Take 1 tablet (40 mg total) by mouth daily.  30 tablet  1  . Rivaroxaban (XARELTO) 20 MG TABS Take 20 mg by mouth daily.       . simvastatin (ZOCOR) 20 MG tablet Take 20 mg by mouth every evening.       No current facility-administered medications for this visit.    Allergies  Allergen Reactions  . Penicillins Rash    History   Social History  . Marital Status: Married    Spouse Name: N/A    Number of Children: N/A  . Years of Education: N/A   Occupational History  . Not on file.   Social History Main Topics  . Smoking status: Never Smoker   . Smokeless tobacco: Not on file  . Alcohol Use: No  . Drug Use: No  . Sexually Active: Not on file   Other Topics Concern  . Not on file   Social History Narrative   Lives  in [place] with spouse.  3 children are healthy.     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    Family History  Problem Relation Age of Onset  . Heart disease    . Diabetes      ROS-  All systems are reviewed and are negative except as outlined in the HPI above  Physical Exam: Filed Vitals:   09/10/12 0921  BP: 126/83  Pulse: 122  Height: 6' 2.5" (1.892 m)  Weight: 304 lb 9.6 oz (138.166 kg)    GEN- The patient is well appearing, alert and oriented x 3 today.   Head- normocephalic, atraumatic Eyes-  Sclera clear, conjunctiva pink Ears- hearing intact Oropharynx- clear Neck- supple, no JVP Lymph- no cervical lymphadenopathy Lungs- Clear to ausculation bilaterally, normal work of breathing Heart- irregular rate and rhythm, no murmurs, rubs or gallops, PMI not laterally displaced GI- soft, NT, ND, + BS Extremities- no clubbing, cyanosis, or  edema MS- no significant deformity or atrophy Skin- no rash or lesion Psych- euthymic mood, full affect Neuro- strength and sensation are intact Assessment and Plan:   1. afib Recurrence of afib post ablation, now persistent. He is anticoagulated with xarelto. Therapeutic strategies for afib including medicine (tikosyn) and repeat ablation were discussed in detail with the patient today. Risk, benefits, and alternatives to EP study and radiofrequency ablation for afib were also discussed in detail today. These risks include but are not limited to stroke, bleeding, vascular damage, tamponade, perforation, damage to the esophagus, lungs, and other structures, pulmonary vein stenosis, worsening renal function, and death. The patient understands these risk and wishes to proceed.  We will therefore proceed with catheter ablation at the next available time. Increase coreg to 6.25mg  BID today  2. Acute systolic dysfunction Newly depressed EF is likely tachy mediated ekg today reveals afib with V rate 136 bpm Will increase coreg and aggressively pursue sinus rhythm with ablation  3. OSA Compliance with CPAP and weight loss encouraged

## 2012-09-10 NOTE — Patient Instructions (Addendum)
See instruction sheet  Your physician has recommended you make the following change in your medication:  1) Increase your Carevedilol to 6.25mg  twice daily

## 2012-09-10 NOTE — Addendum Note (Signed)
Addended by: Dennis Bast F on: 09/10/2012 10:17 AM   Modules accepted: Orders

## 2012-09-11 ENCOUNTER — Other Ambulatory Visit: Payer: Self-pay | Admitting: *Deleted

## 2012-09-11 ENCOUNTER — Encounter (HOSPITAL_COMMUNITY): Payer: Self-pay | Admitting: Pharmacy Technician

## 2012-09-17 ENCOUNTER — Encounter (HOSPITAL_COMMUNITY): Payer: Self-pay | Admitting: *Deleted

## 2012-09-17 ENCOUNTER — Ambulatory Visit (HOSPITAL_COMMUNITY)
Admission: RE | Admit: 2012-09-17 | Discharge: 2012-09-17 | Disposition: A | Discharge status: Home / Self Care | Payer: BC Managed Care – PPO | Source: Ambulatory Visit | Attending: Cardiology | Admitting: Cardiology

## 2012-09-17 ENCOUNTER — Encounter (HOSPITAL_COMMUNITY)
Admission: RE | Disposition: A | Discharge status: Home / Self Care | Payer: Self-pay | Source: Ambulatory Visit | Attending: Cardiology

## 2012-09-17 DIAGNOSIS — I079 Rheumatic tricuspid valve disease, unspecified: Secondary | ICD-10-CM | POA: Insufficient documentation

## 2012-09-17 DIAGNOSIS — I4891 Unspecified atrial fibrillation: Secondary | ICD-10-CM | POA: Insufficient documentation

## 2012-09-17 DIAGNOSIS — I059 Rheumatic mitral valve disease, unspecified: Secondary | ICD-10-CM | POA: Insufficient documentation

## 2012-09-17 DIAGNOSIS — I4819 Other persistent atrial fibrillation: Secondary | ICD-10-CM

## 2012-09-17 HISTORY — PX: ABLATION OF DYSRHYTHMIC FOCUS: SHX254

## 2012-09-17 HISTORY — PX: TEE WITHOUT CARDIOVERSION: SHX5443

## 2012-09-17 SURGERY — ECHOCARDIOGRAM, TRANSESOPHAGEAL
Anesthesia: Moderate Sedation

## 2012-09-17 MED ORDER — BUTAMBEN-TETRACAINE-BENZOCAINE 2-2-14 % EX AERO
INHALATION_SPRAY | CUTANEOUS | Status: DC | PRN
  Administered 2012-09-17: 2 via TOPICAL

## 2012-09-17 MED ORDER — FENTANYL CITRATE 0.05 MG/ML IJ SOLN
INTRAMUSCULAR | Status: AC
  Filled 2012-09-17: qty 2

## 2012-09-17 MED ORDER — FENTANYL CITRATE 0.05 MG/ML IJ SOLN
INTRAMUSCULAR | Status: DC | PRN
  Administered 2012-09-17 (×3): 25 ug via INTRAVENOUS

## 2012-09-17 MED ORDER — MIDAZOLAM HCL 5 MG/ML IJ SOLN
INTRAMUSCULAR | Status: AC
  Filled 2012-09-17: qty 2

## 2012-09-17 MED ORDER — SODIUM CHLORIDE 0.9 % IV SOLN: INTRAVENOUS | Status: DC

## 2012-09-17 MED ORDER — MIDAZOLAM HCL 10 MG/2ML IJ SOLN
INTRAMUSCULAR | Status: DC | PRN
  Administered 2012-09-17: 2 mg via INTRAVENOUS
  Administered 2012-09-17: 1 mg via INTRAVENOUS
  Administered 2012-09-17: 2 mg via INTRAVENOUS

## 2012-09-17 NOTE — Interval H&P Note (Signed)
History and Physical Interval Note:  09/17/2012 11:02 AM  Joseph Garrett  has presented today for surgery, with the diagnosis of a-fib  The various methods of treatment have been discussed with the patient and family. After consideration of risks, benefits and other options for treatment, the patient has consented to  Procedure(s): TRANSESOPHAGEAL ECHOCARDIOGRAM (TEE) (N/A) as a surgical intervention .  The patient's history has been reviewed, patient examined, no change in status, stable for surgery.  I have reviewed the patient's chart and labs.  Questions were answered to the patient's satisfaction.     [name]

## 2012-09-17 NOTE — CV Procedure (Signed)
See full TEE report in [facility]; EF 30; no LAA thrombus identified; mild to moderate MR. [name]

## 2012-09-17 NOTE — H&P (Signed)
Joseph Range, MD Physician Signed  Progress Notes Service date: 09/10/2012 10:00 AM  Related encounter: Office Visit from 09/10/2012 in [facility] [place])       PCP:  [name], MD Primary Cardiologist:  Dr [name] [facility])   The patient presents today for routine electrophysiology followup.  Since last being seen in our clinic, the patient has developed recurrent afib.  He reports increasing frequency of afib over the past 6 weeks.  He was seen by Dr [name] and had an echo which revealed EF 30% with mild MR and normal LA size.  He has had a low risk myoview also.  He reports fatigue and decreased exercise tolerance with occasional palpitations.  Today, he denies symptoms of chest pain, shortness of breath, orthopnea, PND, lower extremity edema, dizziness, presyncope, syncope, or neurologic sequela.  The patient feels that he is tolerating medications without difficulties and is otherwise without complaint today.     Past Medical History   Diagnosis  Date   .  Paroxysmal atrial fibrillation     .  TIA (transient ischemic attack)     .  Hypercholesteremia     .  Thrombocytopenia     .  OSA (obstructive sleep apnea)         mild, uses CPAP   .  Hypertension     .  Obesity     .  Depression     .  Gout     .  Hives     .  GERD (gastroesophageal reflux disease)     .  Cirrhosis         liver scarring on biopsy, presumed to be due to methotrexate   .  DJD (degenerative joint disease)     .  Psoriasis      Past Surgical History   Procedure  Laterality  Date   .  Cholecystectomy    2011   .  Nose surgery           Turbinates   .  Reconstruction of nose       .  Tee without cardioversion    03/16/2012       Procedure: TRANSESOPHAGEAL ECHOCARDIOGRAM (TEE);  Surgeon: [name], MD;  Location: [facility] ENDOSCOPY;  Service: Cardiovascular;  Laterality: N/A;   .  Atrial fibrillation ablation    03/17/12       PVI and CTI ablation by Dr [name]        Current Outpatient Prescriptions   Medication  Sig  Dispense  Refill   .  beclomethasone (QVAR) 40 MCG/ACT inhaler  Inhale 2 puffs into the lungs every morning.          .  carvedilol (COREG) 3.125 MG tablet  Take 3.125 mg by mouth daily.         .  cetirizine (ZYRTEC) 10 MG tablet  Take 10 mg by mouth daily.         .  Febuxostat (ULORIC) 80 MG TABS  Take 80 mg by mouth daily.          Marland Kitchen  lisinopril (PRINIVIL,ZESTRIL) 5 MG tablet  Take 5 mg by mouth daily.         .  Omega-3 Fatty Acids (FISH OIL) 300 MG CAPS  Take 1,200 mg by mouth daily.         .  pantoprazole (PROTONIX) 40 MG tablet  Take 1 tablet (40 mg total) by mouth daily.  30 tablet   1   .  Rivaroxaban (XARELTO) 20 MG TABS  Take 20 mg by mouth daily.          .  simvastatin (ZOCOR) 20 MG tablet  Take 20 mg by mouth every evening.             No current facility-administered medications for this visit.       Allergies   Allergen  Reactions   .  Penicillins  Rash       History       Social History   .  Marital Status:  Married       Spouse Name:  N/A       Number of Children:  N/A   .  Years of Education:  N/A       Occupational History   .  Not on file.       Social History Main Topics   .  Smoking status:  Never Smoker    .  Smokeless tobacco:  Not on file   .  Alcohol Use:  No   .  Drug Use:  No   .  Sexually Active:  Not on file       Other Topics  Concern   .  Not on file       Social History Narrative     Lives in [place] with spouse.  3 children are healthy.       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       Family History   Problem  Relation  Age of Onset   .  Heart disease       .  Diabetes          ROS-  All systems are reviewed and are negative except as outlined in the HPI above   Physical Exam: Filed Vitals:     09/10/12 0921   BP:  126/83   Pulse:  122   Height:  6' 2.5" (1.892 m)   Weight:  304 lb 9.6 oz (138.166 kg)      GEN- The patient is well  appearing, alert and oriented x 3 today.    Head- normocephalic, atraumatic Eyes-  Sclera clear, conjunctiva pink Ears- hearing intact Oropharynx- clear Neck- supple, no JVP Lymph- no cervical lymphadenopathy Lungs- Clear to ausculation bilaterally, normal work of breathing Heart- irregular rate and rhythm, no murmurs, rubs or gallops, PMI not laterally displaced GI- soft, NT, ND, + BS Extremities- no clubbing, cyanosis, or edema MS- no significant deformity or atrophy Skin- no rash or lesion Psych- euthymic mood, full affect Neuro- strength and sensation are intact Assessment and Plan:     1. afib Recurrence of afib post ablation, now persistent. He is anticoagulated with xarelto. Therapeutic strategies for afib including medicine (tikosyn) and repeat ablation were discussed in detail with the patient today. Risk, benefits, and alternatives to EP study and radiofrequency ablation for afib were also discussed in detail today. These risks include but are not limited to stroke, bleeding, vascular damage, tamponade, perforation, damage to the esophagus, lungs, and other structures, pulmonary vein stenosis, worsening renal function, and death. The patient understands these risk and wishes to proceed.  We will therefore proceed with catheter ablation at the next available time. Increase coreg to 6.25mg  BID today   2. Acute systolic dysfunction Newly depressed EF is likely tachy mediated ekg today reveals afib with V rate 136 bpm Will increase coreg and  aggressively pursue sinus rhythm with ablation   3. OSA Compliance with CPAP and weight loss encouraged  For TEE; no changes Joseph Garrett

## 2012-09-17 NOTE — Progress Notes (Signed)
  Echocardiogram Echocardiogram Transesophageal has been performed.  Georgian Co 09/17/2012, 11:50 AM

## 2012-09-18 ENCOUNTER — Ambulatory Visit (HOSPITAL_COMMUNITY): Payer: BC Managed Care – PPO | Admitting: Certified Registered"

## 2012-09-18 ENCOUNTER — Ambulatory Visit (HOSPITAL_COMMUNITY)
Admission: RE | Admit: 2012-09-18 | Discharge: 2012-09-19 | Disposition: A | Discharge status: Home / Self Care | Payer: BC Managed Care – PPO | Source: Ambulatory Visit | Attending: Internal Medicine | Admitting: Internal Medicine

## 2012-09-18 ENCOUNTER — Encounter (HOSPITAL_COMMUNITY)
Admission: RE | Disposition: A | Discharge status: Home / Self Care | Payer: Self-pay | Source: Ambulatory Visit | Attending: Internal Medicine

## 2012-09-18 ENCOUNTER — Encounter (HOSPITAL_COMMUNITY): Payer: Self-pay | Admitting: Certified Registered"

## 2012-09-18 DIAGNOSIS — I519 Heart disease, unspecified: Secondary | ICD-10-CM | POA: Insufficient documentation

## 2012-09-18 DIAGNOSIS — G4733 Obstructive sleep apnea (adult) (pediatric): Secondary | ICD-10-CM

## 2012-09-18 DIAGNOSIS — G4730 Sleep apnea, unspecified: Secondary | ICD-10-CM | POA: Diagnosis present

## 2012-09-18 DIAGNOSIS — I428 Other cardiomyopathies: Secondary | ICD-10-CM | POA: Insufficient documentation

## 2012-09-18 DIAGNOSIS — I509 Heart failure, unspecified: Secondary | ICD-10-CM | POA: Insufficient documentation

## 2012-09-18 DIAGNOSIS — I5023 Acute on chronic systolic (congestive) heart failure: Secondary | ICD-10-CM | POA: Insufficient documentation

## 2012-09-18 DIAGNOSIS — I4891 Unspecified atrial fibrillation: Secondary | ICD-10-CM | POA: Insufficient documentation

## 2012-09-18 DIAGNOSIS — Z7901 Long term (current) use of anticoagulants: Secondary | ICD-10-CM | POA: Insufficient documentation

## 2012-09-18 DIAGNOSIS — I10 Essential (primary) hypertension: Secondary | ICD-10-CM | POA: Insufficient documentation

## 2012-09-18 DIAGNOSIS — I4819 Other persistent atrial fibrillation: Secondary | ICD-10-CM | POA: Diagnosis present

## 2012-09-18 DIAGNOSIS — I5021 Acute systolic (congestive) heart failure: Secondary | ICD-10-CM | POA: Diagnosis present

## 2012-09-18 HISTORY — PX: ATRIAL FIBRILLATION ABLATION: SHX5456

## 2012-09-18 LAB — POCT ACTIVATED CLOTTING TIME
Activated Clotting Time: 263 seconds
Activated Clotting Time: 285 seconds
Activated Clotting Time: 290 seconds
Activated Clotting Time: 181 seconds

## 2012-09-18 LAB — MRSA PCR SCREENING: MRSA by PCR: NEGATIVE

## 2012-09-18 SURGERY — ATRIAL FIBRILLATION ABLATION
Anesthesia: Monitor Anesthesia Care

## 2012-09-18 MED ORDER — PROPOFOL INFUSION 10 MG/ML OPTIME
INTRAVENOUS | Status: DC | PRN
  Administered 2012-09-18: 100 ug/kg/min via INTRAVENOUS

## 2012-09-18 MED ORDER — LIDOCAINE HCL (CARDIAC) 20 MG/ML IV SOLN
INTRAVENOUS | Status: DC | PRN
  Administered 2012-09-18: 40 mg via INTRAVENOUS

## 2012-09-18 MED ORDER — LACTATED RINGERS IV SOLN
INTRAVENOUS | Status: DC | PRN
  Administered 2012-09-18: 07:00:00 via INTRAVENOUS

## 2012-09-18 MED ORDER — CARVEDILOL 6.25 MG PO TABS
6.2500 mg | ORAL_TABLET | Freq: Every day | ORAL | Status: DC
  Administered 2012-09-18 – 2012-09-19 (×2): 6.25 mg via ORAL
  Filled 2012-09-18 (×2): qty 1

## 2012-09-18 MED ORDER — HEPARIN SODIUM (PORCINE) 1000 UNIT/ML IJ SOLN
INTRAMUSCULAR | Status: AC
  Filled 2012-09-18: qty 1

## 2012-09-18 MED ORDER — DIPHENHYDRAMINE HCL 25 MG PO CAPS
50.0000 mg | ORAL_CAPSULE | Freq: Every evening | ORAL | Status: DC | PRN
  Administered 2012-09-18: 50 mg via ORAL
  Filled 2012-09-18: qty 2

## 2012-09-18 MED ORDER — ACETAMINOPHEN 325 MG PO TABS
650.0000 mg | ORAL_TABLET | ORAL | Status: DC | PRN
  Administered 2012-09-19: 650 mg via ORAL
  Filled 2012-09-18: qty 2

## 2012-09-18 MED ORDER — RIVAROXABAN 20 MG PO TABS
20.0000 mg | ORAL_TABLET | Freq: Every day | ORAL | Status: DC
  Administered 2012-09-18: 20 mg via ORAL
  Filled 2012-09-18 (×2): qty 1

## 2012-09-18 MED ORDER — SODIUM CHLORIDE 0.9 % IJ SOLN
3.0000 mL | Freq: Two times a day (BID) | INTRAMUSCULAR | Status: DC
  Administered 2012-09-18: 3 mL via INTRAVENOUS

## 2012-09-18 MED ORDER — LISINOPRIL 5 MG PO TABS
5.0000 mg | ORAL_TABLET | Freq: Every day | ORAL | Status: DC
  Administered 2012-09-18 – 2012-09-19 (×2): 5 mg via ORAL
  Filled 2012-09-18 (×2): qty 1

## 2012-09-18 MED ORDER — HEPARIN SODIUM (PORCINE) 1000 UNIT/ML IJ SOLN
INTRAMUSCULAR | Status: DC | PRN
  Administered 2012-09-18: 3000 [IU] via INTRAVENOUS
  Administered 2012-09-18: 4000 [IU] via INTRAVENOUS
  Administered 2012-09-18: 10000 [IU]

## 2012-09-18 MED ORDER — FENTANYL CITRATE 0.05 MG/ML IJ SOLN
INTRAMUSCULAR | Status: DC | PRN
  Administered 2012-09-18: 25 ug via INTRAVENOUS
  Administered 2012-09-18: 100 ug via INTRAVENOUS

## 2012-09-18 MED ORDER — PROTAMINE SULFATE 10 MG/ML IV SOLN
INTRAVENOUS | Status: DC | PRN
  Administered 2012-09-18: 30 mg via INTRAVENOUS

## 2012-09-18 MED ORDER — PROPOFOL 10 MG/ML IV BOLUS
INTRAVENOUS | Status: DC | PRN
  Administered 2012-09-18 (×2): 20 mg via INTRAVENOUS

## 2012-09-18 MED ORDER — SODIUM CHLORIDE 0.9 % IV SOLN: 250.0000 mL | INTRAVENOUS | Status: DC | PRN

## 2012-09-18 MED ORDER — BUPIVACAINE HCL (PF) 0.25 % IJ SOLN
INTRAMUSCULAR | Status: AC
  Filled 2012-09-18: qty 30

## 2012-09-18 MED ORDER — SODIUM CHLORIDE 0.9 % IJ SOLN
3.0000 mL | INTRAMUSCULAR | Status: DC | PRN
  Administered 2012-09-19: 3 mL via INTRAVENOUS

## 2012-09-18 MED ORDER — HYDROCODONE-ACETAMINOPHEN 5-325 MG PO TABS: 1.0000 | ORAL_TABLET | ORAL | Status: DC | PRN

## 2012-09-18 MED ORDER — ONDANSETRON HCL 4 MG/2ML IJ SOLN: 4.0000 mg | Freq: Four times a day (QID) | INTRAMUSCULAR | Status: DC | PRN

## 2012-09-18 MED ORDER — MIDAZOLAM HCL 5 MG/5ML IJ SOLN
INTRAMUSCULAR | Status: DC | PRN
  Administered 2012-09-18: 2 mg via INTRAVENOUS

## 2012-09-18 MED ORDER — SIMVASTATIN 20 MG PO TABS
20.0000 mg | ORAL_TABLET | Freq: Every evening | ORAL | Status: DC
Start: 2012-09-18 — End: 2012-09-19
  Administered 2012-09-18: 20 mg via ORAL
  Filled 2012-09-18 (×2): qty 1

## 2012-09-18 NOTE — Interval H&P Note (Signed)
History and Physical Interval Note:  09/18/2012 7:30 AM  Joseph Garrett  has presented today for surgery, with the diagnosis of afib  The various methods of treatment have been discussed with the patient and family. After consideration of risks, benefits and other options for treatment, the patient has consented to  Procedure(s): ATRIAL FIBRILLATION ABLATION (N/A) as a surgical intervention .  The patient's history has been reviewed, patient examined, no change in status, stable for surgery.  I have reviewed the patient's chart and labs.  Questions were answered to the patient's satisfaction.     Hillis Range

## 2012-09-18 NOTE — Anesthesia Procedure Notes (Signed)
Procedure Name: MAC Date/Time: 09/18/2012 7:55 AM Performed by: Charm Barges, Izick Gasbarro R Pre-anesthesia Checklist: Patient identified, Emergency Drugs available, Suction available, Patient being monitored and Timeout performed Patient Re-evaluated:Patient Re-evaluated prior to inductionOxygen Delivery Method: Simple face mask Ventilation: Nasal airway inserted- appropriate to patient size Placement Confirmation: positive ETCO2

## 2012-09-18 NOTE — H&P (View-Only) (Signed)
PCP:  [name], MD Primary Cardiologist:  Dr [name] [facility])  The patient presents today for routine electrophysiology followup.  Since last being seen in our clinic, the patient has developed recurrent afib.  He reports increasing frequency of afib over the past 6 weeks.  He was seen by Dr [name] and had an echo which revealed EF 30% with mild MR and normal LA size.  He has had a low risk myoview also.  He reports fatigue and decreased exercise tolerance with occasional palpitations.  Today, he denies symptoms of chest pain, shortness of breath, orthopnea, PND, lower extremity edema, dizziness, presyncope, syncope, or neurologic sequela.  The patient feels that he is tolerating medications without difficulties and is otherwise without complaint today.   Past Medical History  Diagnosis Date  . Paroxysmal atrial fibrillation   . TIA (transient ischemic attack)   . Hypercholesteremia   . Thrombocytopenia   . OSA (obstructive sleep apnea)     mild, uses CPAP  . Hypertension   . Obesity   . Depression   . Gout   . Hives   . GERD (gastroesophageal reflux disease)   . Cirrhosis     liver scarring on biopsy, presumed to be due to methotrexate  . DJD (degenerative joint disease)   . Psoriasis    Past Surgical History  Procedure Laterality Date  . Cholecystectomy  2011  . Nose surgery      Turbinates  . Reconstruction of nose    . Tee without cardioversion  03/16/2012    Procedure: TRANSESOPHAGEAL ECHOCARDIOGRAM (TEE);  Surgeon: [name], MD;  Location: [facility] ENDOSCOPY;  Service: Cardiovascular;  Laterality: N/A;  . Atrial fibrillation ablation  03/17/12    PVI and CTI ablation by Dr [name]    Current Outpatient Prescriptions  Medication Sig Dispense Refill  . beclomethasone (QVAR) 40 MCG/ACT inhaler Inhale 2 puffs into the lungs every morning.       . carvedilol (COREG) 3.125 MG tablet Take 3.125 mg by mouth daily.      . cetirizine (ZYRTEC) 10 MG tablet Take  10 mg by mouth daily.      . Febuxostat (ULORIC) 80 MG TABS Take 80 mg by mouth daily.       Marland Kitchen lisinopril (PRINIVIL,ZESTRIL) 5 MG tablet Take 5 mg by mouth daily.      . Omega-3 Fatty Acids (FISH OIL) 300 MG CAPS Take 1,200 mg by mouth daily.      . pantoprazole (PROTONIX) 40 MG tablet Take 1 tablet (40 mg total) by mouth daily.  30 tablet  1  . Rivaroxaban (XARELTO) 20 MG TABS Take 20 mg by mouth daily.       . simvastatin (ZOCOR) 20 MG tablet Take 20 mg by mouth every evening.       No current facility-administered medications for this visit.    Allergies  Allergen Reactions  . Penicillins Rash    History   Social History  . Marital Status: Married    Spouse Name: N/A    Number of Children: N/A  . Years of Education: N/A   Occupational History  . Not on file.   Social History Main Topics  . Smoking status: Never Smoker   . Smokeless tobacco: Not on file  . Alcohol Use: No  . Drug Use: No  . Sexually Active: Not on file   Other Topics Concern  . Not on file   Social History Narrative   Lives  in [place] with spouse.  3 children are healthy.     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    Family History  Problem Relation Age of Onset  . Heart disease    . Diabetes      ROS-  All systems are reviewed and are negative except as outlined in the HPI above  Physical Exam: Filed Vitals:   09/10/12 0921  BP: 126/83  Pulse: 122  Height: 6' 2.5" (1.892 m)  Weight: 304 lb 9.6 oz (138.166 kg)    GEN- The patient is well appearing, alert and oriented x 3 today.   Head- normocephalic, atraumatic Eyes-  Sclera clear, conjunctiva pink Ears- hearing intact Oropharynx- clear Neck- supple, no JVP Lymph- no cervical lymphadenopathy Lungs- Clear to ausculation bilaterally, normal work of breathing Heart- irregular rate and rhythm, no murmurs, rubs or gallops, PMI not laterally displaced GI- soft, NT, ND, + BS Extremities- no clubbing, cyanosis, or  edema MS- no significant deformity or atrophy Skin- no rash or lesion Psych- euthymic mood, full affect Neuro- strength and sensation are intact Assessment and Plan:   1. afib Recurrence of afib post ablation, now persistent. He is anticoagulated with xarelto. Therapeutic strategies for afib including medicine (tikosyn) and repeat ablation were discussed in detail with the patient today. Risk, benefits, and alternatives to EP study and radiofrequency ablation for afib were also discussed in detail today. These risks include but are not limited to stroke, bleeding, vascular damage, tamponade, perforation, damage to the esophagus, lungs, and other structures, pulmonary vein stenosis, worsening renal function, and death. The patient understands these risk and wishes to proceed.  We will therefore proceed with catheter ablation at the next available time. Increase coreg to 6.25mg  BID today  2. Acute systolic dysfunction Newly depressed EF is likely tachy mediated ekg today reveals afib with V rate 136 bpm Will increase coreg and aggressively pursue sinus rhythm with ablation  3. OSA Compliance with CPAP and weight loss encouraged

## 2012-09-18 NOTE — Progress Notes (Signed)
Utilization Review Completed.   Natesha Hassey, RN, BSN Nurse Case Manager  [phone]  

## 2012-09-18 NOTE — Transfer of Care (Signed)
Immediate Anesthesia Transfer of Care Note  Patient: Joseph Garrett  Procedure(s) Performed: Procedure(s): ATRIAL FIBRILLATION ABLATION (N/A)  Patient Location: Cath Lab  Anesthesia Type:MAC  Level of Consciousness: awake, alert  and oriented  Airway & Oxygen Therapy: Patient Spontanous Breathing  Post-op Assessment: Report given to PACU RN and Post -op Vital signs reviewed and stable  Post vital signs: Reviewed and stable  Complications: No apparent anesthesia complications

## 2012-09-18 NOTE — Preoperative (Signed)
Beta Blockers   Reason not to administer Beta Blockers:Coreg 09/17/12 @ 2000hrs

## 2012-09-18 NOTE — Anesthesia Preprocedure Evaluation (Addendum)
Anesthesia Evaluation  Patient identified by MRN, date of birth, ID band Patient awake    Reviewed: Allergy & Precautions, H&P , NPO status , Patient's Chart, lab work & pertinent test results, reviewed documented beta blocker date and time   History of Anesthesia Complications Negative for: history of anesthetic complications  Airway Mallampati: III  Neck ROM: full    Dental  (+) Teeth Intact and Dental Advisory Given   Pulmonary shortness of breath and with exertion, sleep apnea and Continuous Positive Airway Pressure Ventilation ,  breath sounds clear to auscultation        Cardiovascular hypertension, Pt. on medications and Pt. on home beta blockers + dysrhythmias Atrial Fibrillation Rhythm:irregular Rate:Normal     Neuro/Psych PSYCHIATRIC DISORDERS TIA   GI/Hepatic GERD-  Medicated and Controlled,  Endo/Other  Morbid obesity  Renal/GU      Musculoskeletal   Abdominal (+) + obese,   Peds  Hematology   Anesthesia Other Findings   Reproductive/Obstetrics                         Anesthesia Physical Anesthesia Plan  ASA: III  Anesthesia Plan: MAC   Post-op Pain Management:    Induction: Intravenous  Airway Management Planned: Natural Airway and Simple Face Mask  Additional Equipment:   Intra-op Plan:   Post-operative Plan:   Informed Consent: I have reviewed the patients History and Physical, chart, labs and discussed the procedure including the risks, benefits and alternatives for the proposed anesthesia with the patient or authorized representative who has indicated his/her understanding and acceptance.   Dental Advisory Given and Dental advisory given  Plan Discussed with: CRNA, Surgeon and Anesthesiologist  Anesthesia Plan Comments:       Anesthesia Quick Evaluation

## 2012-09-18 NOTE — Op Note (Signed)
SURGEON:  Hillis Range, MD  PREPROCEDURE DIAGNOSES: 1. Persistent atrial fibrillation.  POSTPROCEDURE DIAGNOSES: 1. Persistent atrial fibrillation.  PROCEDURES: 1. Comprehensive electrophysiologic study. 2. Coronary sinus pacing and recording. 3. Three-dimensional mapping of atrial fibrillation with additional mapping and ablation of a second discrete focus 4. Ablation of atrial fibrillation with additional mapping and ablation of a second discrete focus. 5. Intracardiac echocardiography. 6. Transseptal puncture of an intact septum. 7. Rotational Angiography with processing at an independent workstation 8. Arrhythmia induction with pacing 90.External cardioversion.  INTRODUCTION:  Joseph Garrett is a 52 y.o. male with a history of persistent atrial fibrillation who now presents for EP study and radiofrequency ablation.  The patient reports initially being diagnosed with atrial fibrillation after presenting with symptomatic palpitations and fatgiue.  The patient has failed medical therapy and underwent afib ablation 9/13. He did well initially following ablation.  He has now developed recurrent symptomatic atrial fibrillation. The patient therefore presents today for repeat catheter ablation of atrial fibrillation.  DESCRIPTION OF PROCEDURE:  Informed written consent was obtained, and the patient was brought to the electrophysiology lab in a fasting state.  The patient was adequately sedated with intravenous medications as outlined in the anesthesia report.  The patient's left and right groins were prepped and draped in the usual sterile fashion by the EP lab staff.  Using a percutaneous Seldinger technique, two 7-French and one 11-French hemostasis sheaths were placed into the right common femoral vein.     3 Dimensional Rotational Angiography: A 5 french pigtail catheter was introduced through the right common femoral vein and advanced into the inferior venocava.  3 demential rotational  angiography was then performed by power injection of 100cc of nonionic contrast.  Reprocessing at an independent work station was then performed.   This demonstrated a large sized left atrium with 4 separate pulmonary veins which were also moderate in size.  There were no anomalous veins or significant abnormalities.  A 3 dimensional rendering of the left atrium was then merged using [employer] onto the Well[employer] system and registered with intracardiac echo (see below).  The pigtail catheter was then removed.  Catheter Placement:  A 7-French Biosense Webster Decapolar coronary sinus catheter was introduced through the right common femoral vein and advanced into the coronary sinus for recording and pacing from this location.  A 6-French quadripolar Josephson catheter was introduced through the right common femoral vein and advanced into the right ventricle for recording and pacing.  This catheter was then pulled back to the His bundle location.    Initial Measurements: The patient presented to the electrophysiology lab in atrial fibrillation.    The HV interval 57 msec.     Intracardiac Echocardiography: A 10-French Biosense Webster AcuNav intracardiac echocardiography catheter was introduced through the left common femoral vein and advanced into the right atrium. Intracardiac echocardiography was performed of the left atrium, and a three-dimensional anatomical rendering of the left atrium was performed using CARTO sound technology.  The patient was noted to have a a large sized left atrium.  The interatrial septum was prominent but not aneurysmal. All 4 pulmonary veins were visualized and noted to have separate ostia.  The pulmonary veins were moderate in size.  The left atrial appendage was visualized and did not reveal thrombus.   There was no evidence of pulmonary vein stenosis.   Transseptal Puncture: The middle right common femoral vein sheath was exchanged for an 8.5 [place] SL2  transseptal sheath and transseptal access  was achieved in a standard fashion using a Brockenbrough needle under biplane fluoroscopy with intracardiac echocardiography confirmation of the transseptal puncture.  Once transseptal access had been achieved, heparin was administered intravenously and intra- arterially in order to maintain an ACT of greater than 350 seconds throughout the procedure.   3D Mapping and Ablation: The His bundle catheter was removed and in its place a 3.5 mm Biosense Webster EZ [employer] ablation catheter was advanced into the right atrium.  The transseptal sheath was pulled back into the IVC over a guidewire.  The ablation catheter was advanced across the transseptal hole using the wire as a guide.  The transseptal sheath was then re-advanced over the guidewire into the left atrium.  A duodecapolar Biosense Webster circular mapping catheter was introduced through the transseptal sheath and positioned over the mouth of all 4 pulmonary veins.  Three-dimensional electroanatomical mapping was performed using CARTO technology.  This demonstrated that the left superior and right superior pulmonary veins were electrically silent from the prior ablation.  There was electrical activity within the right inferior and left inferior pulmonary veins at baseline. The patient underwent successful reisolation and anatomical encircling of the left and right inferior pulmonary veins using radiofrequency current with a circular mapping catheter as a guide.  Complex fractionated electrograms (CFAEs) were then identified and ablated along the roof of the left atrium, base of the left atrial appendage (LAA),  along the ridge between the left superior pulmonary vein and the left atrial appendage, the interatrial septum, along the lateral wall of the left atrium, and above the coronary sinus.   Cardioversion: The patient was then cardioverted to sinus rhythm with a single synchronized 360-J biphasic  shock with cardioversion electrodes in the anterior-posterior thoracic configuration. He maintained sinus rhythm thereafter.  Measurements Following Ablation: In sinus rhythm with RR interval was 894, with PR 165 msec, QRS 83 msec, and Qtc 365 msec.  Following ablation the AH interval measured with an HV interval of 59 msec. Ventricular pacing was performed, which revealed midline decremental VA conduction with a VA Wenckebach cycle length of 300 msec.  Rapid atrial pacing was performed, which revealed an AV Wenckebach cycle length of 290 msec.  Electroisolation was then again confirmed in all four pulmonary veins. Differential atrial pacing was performed from the low lateral right atrium which confirmed CTI block from a prior ablation procedure with stimulus to earliest activation recorded bidirectionally across the isthmus measuring 150 msec.  The procedure was therefore considered completed.  All catheters were removed, and the sheaths were aspirated and flushed.  The patient was transferred to the recovery area for sheath removal per protocol.  A limited bedside transthoracic echocardiogram revealed no pericardial effusion.  There were no early apparent complications.  CONCLUSIONS: 1. Atrial fibrillation upon presentation.   2. Rotational Angiography reveals a large sized left atrium with four separate pulmonary veins without evidence of pulmonary vein stenosis. 3. The left superior and right superior pulmonary veins were electrically silent from the prior ablation.  There was electrical activity within the right inferior and left inferior pulmonary veins.  4. Successful reisolation and anatomical encircling of the left and right inferior pulmonary veins using radiofrequency current  5. CFAEs were identified and ablated along the roof of the left atrium, base of the left atrial appendage (LAA),  along the ridge between the left superior pulmonary vein and the left atrial appendage, the  interatrial septum, along the lateral wall of the left atrium, and above  the coronary sinus.    6. Atrial fibrillation successfully cardioverted to sinus rhythm. 7. CTI block demonstrated from a prior ablation 8. No early apparent complications.   Kazumi Lachney,MD 10:40 AM 09/18/2012

## 2012-09-18 NOTE — Anesthesia Postprocedure Evaluation (Signed)
  Anesthesia Post-op Note  Patient: Joseph Garrett  Procedure(s) Performed: Procedure(s): ATRIAL FIBRILLATION ABLATION (N/A)  Patient Location: Cath Lab  Anesthesia Type:MAC  Level of Consciousness: awake, alert  and oriented  Airway and Oxygen Therapy: Patient Spontanous Breathing  Post-op Pain: none  Post-op Assessment: Post-op Vital signs reviewed, Patient's Cardiovascular Status Stable, Respiratory Function Stable, Patent Airway and No signs of Nausea or vomiting  Post-op Vital Signs: Reviewed and stable  Complications: No apparent anesthesia complications

## 2012-09-18 NOTE — Progress Notes (Signed)
Ward Givens NP made aware of patients rhythm noted with frequent PAC's and PVC's as well as heart rate increaseing B/P stable, not c/o dyscomfort from patient. ECG was obtained earlier reading SR with PAC's and PVC's. Obtained benadryl order for sleep. Continue to monitor closely

## 2012-09-18 NOTE — Discharge Summary (Signed)
ELECTROPHYSIOLOGY DISCHARGE SUMMARY    Patient ID: Joseph Garrett,  MRN: [id number], DOB/AGE: 1960/12/20 52 y.o.  Admit date: 09/18/2012 Discharge date: 09/19/2012  Primary Cardiologist: Joseph Marrow, MD Joseph Garrett) Primary EP: Joseph Frame, MD  Primary Discharge Diagnosis:  1. Symptomatic AF s/p EPS +RF ablation  Secondary Discharge Diagnoses:  1.  Chronic systolic dysfunction 2. Nonischemic CM  Procedures This Admission:  1. Comprehensive electrophysiologic study.  2. Coronary sinus pacing and recording.  3. Three-dimensional mapping of atrial fibrillation with additional mapping and ablation of a second discrete focus  4. Ablation of atrial fibrillation with additional mapping and ablation of a second discrete focus.  5. Intracardiac echocardiography.  6. Transseptal puncture of an intact septum.  7. Rotational Angiography with processing at an independent workstation  8. Arrhythmia induction with pacing  9. External cardioversion. CONCLUSIONS:  1. Atrial fibrillation upon presentation.  2. Rotational Angiography reveals a large sized left atrium with four separate pulmonary veins without evidence of pulmonary vein stenosis.  3. The left superior and right superior pulmonary veins were electrically silent from the prior ablation. There was electrical activity within the right inferior and left inferior pulmonary veins.  4. Successful reisolation and anatomical encircling of the left and right inferior pulmonary veins using radiofrequency current  5. CFAEs were identified and ablated along the roof of the left atrium, base of the left atrial appendage (LAA), along the ridge between the left superior pulmonary vein and the left atrial appendage, the interatrial septum, along the lateral wall of the left atrium, and above the coronary sinus.  6. Atrial fibrillation successfully cardioverted to sinus rhythm.  7. CTI block demonstrated from a prior ablation  8. No early apparent  complications.  History and Hospital Course:  Mr. Joseph Garrett is a 52 year old man who was initially diagnosed with atrial fibrillation after presenting with symptomatic palpitations and fatgiue. The patient has failed medical therapy and underwent previous AFib ablation Sept 2013. He did well initially following ablation. He has now developed recurrent symptomatic atrial fibrillation. He presented yesterday for EPS +RF ablation. He tolerated this procedure well without any immediate complication. He remains hemodynamically stable and afebrile. His groin site is intact without significant bleeding or hematoma. His telemetry reveals recurrence of afib overnight with elevated V rates. He has been given discharge instructions including wound care and activity restrictions. Of note, he underwent TEE pre-procedure on 09/17/2012 which revealed EF 30%, mild-mod MR, no LAA thrombus identified (see full report in [employer]). He will follow-up in clinic in 2 weeks. He has been seen, examined and deemed stable for discharge today by Dr. Johney Garrett.  He is mildly volume overloaded at discharge and a single dose of Lasix 40mg  IV is administered.  Amiodarone 400mg  BID was initiated after a long discussion about risks and benefits with the patient.  He will return to the office in 2 weeks for labs and followup with Dr Joseph Garrett.  Coreg was also increased.  If he remains in afib, he will require cardioversion at that time. Coreg was increased to 12.5mg  BID for rate control. The importance of compliance with CPAP and anticoagulation was also discussed.  Discharge Vitals: Blood pressure 124/72, pulse 133, temperature 98.3 F (36.8 C), temperature source Oral, resp. rate 18, height 6\' 2"  (1.88 m), weight 304 lb (137.893 kg), SpO2 98.00%.   Labs: Lab Results  Component Value Date   WBC 5.1 09/10/2012   HGB 14.3 09/10/2012   HCT 42.3 09/10/2012   MCV 91.3 09/10/2012  PLT 167.0 09/10/2012     Recent Labs Lab 09/19/12 0445  NA 144   K 3.7  CL 107  CO2 27  BUN 14  CREATININE 1.10  CALCIUM 9.0  GLUCOSE 99   No results found for this basename: CKTOTAL,  CKMB,  CKMBINDEX,  TROPONINI    No results found for this basename: CHOL   No results found for this basename: HDL   No results found for this basename: LDLCALC   No results found for this basename: TRIG   No results found for this basename: CHOLHDL   No results found for this basename: LDLDIRECT   No results found for this basename: DDIMER   No results found for this basename: INR,  in the last 72 hours  Disposition:  The patient is being discharged in stable condition.  Follow-up:     Follow-up Information   Follow up with Joseph Range, MD In 2 weeks.   Contact information:   [address] [place] [address] [phone]      Discharge Medications:    Medication List    TAKE these medications       amiodarone 200 MG tablet  Commonly known as:  PACERONE  Take 2 tablets (400 mg total) by mouth 2 (two) times daily.     carvedilol 12.5 MG tablet  Commonly known as:  COREG  Take 1 tablet (12.5 mg total) by mouth 2 (two) times daily with a meal.     lisinopril 5 MG tablet  Commonly known as:  PRINIVIL,ZESTRIL  Take 5 mg by mouth daily.     pantoprazole 40 MG tablet  Commonly known as:  PROTONIX  Take 1 tablet (40 mg total) by mouth daily.     simvastatin 20 MG tablet  Commonly known as:  ZOCOR  Take 20 mg by mouth every evening.     XARELTO 20 MG Tabs  Generic drug:  Rivaroxaban  Take 20 mg by mouth daily.        Duration of Discharge Encounter: Greater than 30 minutes including physician time.  Joseph Range, MD

## 2012-09-19 DIAGNOSIS — G4733 Obstructive sleep apnea (adult) (pediatric): Secondary | ICD-10-CM

## 2012-09-19 DIAGNOSIS — I4891 Unspecified atrial fibrillation: Secondary | ICD-10-CM

## 2012-09-19 LAB — BASIC METABOLIC PANEL
CO2: 27 mEq/L (ref 19–32)
Calcium: 9 mg/dL (ref 8.4–10.5)
Creatinine, Ser: 1.1 mg/dL (ref 0.50–1.35)
GFR calc Af Amer: 87 mL/min — ABNORMAL LOW (ref >90)
GFR calc non Af Amer: 75 mL/min — ABNORMAL LOW (ref >90)
Sodium: 144 mEq/L (ref 135–145)

## 2012-09-19 MED ORDER — METOPROLOL TARTRATE 1 MG/ML IV SOLN
5.0000 mg | Freq: Once | INTRAVENOUS | Status: AC
  Administered 2012-09-19: 5 mg via INTRAVENOUS

## 2012-09-19 MED ORDER — CARVEDILOL 12.5 MG PO TABS: 12.5000 mg | ORAL_TABLET | Freq: Two times a day (BID) | ORAL | Status: DC

## 2012-09-19 MED ORDER — FUROSEMIDE 10 MG/ML IJ SOLN
INTRAMUSCULAR | Status: AC
  Filled 2012-09-19: qty 4

## 2012-09-19 MED ORDER — FUROSEMIDE 10 MG/ML IJ SOLN
40.0000 mg | Freq: Once | INTRAMUSCULAR | Status: AC
  Administered 2012-09-19: 40 mg via INTRAVENOUS

## 2012-09-19 MED ORDER — METOPROLOL TARTRATE 1 MG/ML IV SOLN
INTRAVENOUS | Status: AC
  Filled 2012-09-19: qty 5

## 2012-09-19 MED ORDER — PANTOPRAZOLE SODIUM 40 MG PO TBEC: 40.0000 mg | DELAYED_RELEASE_TABLET | Freq: Every day | ORAL | Status: DC

## 2012-09-19 MED ORDER — AMIODARONE HCL 200 MG PO TABS: 400.0000 mg | ORAL_TABLET | Freq: Two times a day (BID) | ORAL | Status: DC

## 2012-09-19 NOTE — Progress Notes (Signed)
   SUBJECTIVE: The patient is doing well today.  At this time, he denies chest pain, or any new concerns.  + mild SOB  . carvedilol  6.25 mg Oral Daily  . lisinopril  5 mg Oral Daily  . Rivaroxaban  20 mg Oral Q supper  . simvastatin  20 mg Oral QPM  . sodium chloride  3 mL Intravenous Q12H      OBJECTIVE: Physical Exam: Filed Vitals:   09/19/12 0600 09/19/12 0700 09/19/12 0737 09/19/12 0800  BP: 106/64 117/76  124/72  Pulse: 113 119  133  Temp:   98.3 F (36.8 C)   TempSrc:   Oral   Resp:   18   Height:      Weight:      SpO2: 94% 97%  98%    Intake/Output Summary (Last 24 hours) at 09/19/12 0853 Last data filed at 09/19/12 0800  Gross per 24 hour  Intake   1680 ml  Output      0 ml  Net   1680 ml    Telemetry reveals sinus rhythm  GEN- The patient is well appearing, alert and oriented x 3 today.   Head- normocephalic, atraumatic Eyes-  Sclera clear, conjunctiva pink Ears- hearing intact Oropharynx- clear Neck- supple, no JVP Lymph- no cervical lymphadenopathy Lungs- Few bibasilar rales, otherwise clear to ausculation bilaterally, normal work of breathing Heart- irregular rate and rhythm, no murmurs, rubs or gallops, PMI not laterally displaced GI- soft, NT, ND, + BS Extremities- no clubbing, cyanosis, or edema, no hematoma/ bruit Skin- no rash or lesion Psych- euthymic mood, full affect Neuro- strength and sensation are intact  LABS: Basic Metabolic Panel:  Recent Labs  91/47/82 0445  NA 144  K 3.7  CL 107  CO2 27  GLUCOSE 99  BUN 14  CREATININE 1.10  CALCIUM 9.0   RADIOLOGY: No results found.  ASSESSMENT AND PLAN:  Active Problems:   Sleep apnea   Persistent atrial fibrillation   Acute systolic CHF (congestive heart failure)  1. afib Doing well s/p ablation He has returned to afib overnight. He is adequately anticoagulated with xarelto. At this point, I think that we should treat with a short coarse of amiodarone.  Once through the 3  month healing phase, this will need to be discontinued.  He has had cirrhotic changes on biopsy previously due to methotrexate.   Risks, benefits, and alternatives to amiodarone were discussed at length with the patient today.  He understands that risks include but are not limited to ocular toxicity, thyroid dysfunction, worsening cirrhosis, lung toxicity, and death.  He accepts these risks and wishes to proceed. We will therefore need to follow LFTs closely.  I will start amiodarone 400mg  BID today. Followup with me in the office in 2 weeks with TFTs and LFTs at that time.  Increase coreg today to 12.5mg  BID  2. Nonischemic CM (probably tachy mediated) Limits our antiarrhythmic options.  I think it is essential that we maintain sinus rhythm to allow his EF to recover.  This is my reasoning for short term amiodarone (above) Will increase coreg to 12.5mg  BID as above Lasix 40mg  IV once now  DC to home FOllow-up with me in 2 weeks   Hillis Range, MD 09/19/2012 8:53 AM

## 2012-09-19 NOTE — Progress Notes (Signed)
Dr.Patel notified of ongoing a-fib 110's-120's at rest. Lopressor 5mg  IV administered with no response. HR remains as a-fib 110-120. B/P 117/76. Tylenol given for H/A

## 2012-09-22 ENCOUNTER — Other Ambulatory Visit: Payer: Self-pay | Admitting: *Deleted

## 2012-09-22 DIAGNOSIS — R0602 Shortness of breath: Secondary | ICD-10-CM

## 2012-09-22 DIAGNOSIS — I4819 Other persistent atrial fibrillation: Secondary | ICD-10-CM

## 2012-09-22 DIAGNOSIS — I5021 Acute systolic (congestive) heart failure: Secondary | ICD-10-CM

## 2012-09-23 ENCOUNTER — Telehealth: Payer: Self-pay | Admitting: Internal Medicine

## 2012-09-23 NOTE — Telephone Encounter (Signed)
Walk in pt Form " Insurance Document" Dropped off Will Send to  [employer] For Completion 09/23/12/KM

## 2012-10-02 ENCOUNTER — Other Ambulatory Visit: Payer: Self-pay | Admitting: *Deleted

## 2012-10-02 ENCOUNTER — Encounter: Payer: Self-pay | Admitting: Internal Medicine

## 2012-10-02 ENCOUNTER — Ambulatory Visit (INDEPENDENT_AMBULATORY_CARE_PROVIDER_SITE_OTHER): Payer: BC Managed Care – PPO | Admitting: Internal Medicine

## 2012-10-02 ENCOUNTER — Encounter: Payer: Self-pay | Admitting: *Deleted

## 2012-10-02 ENCOUNTER — Other Ambulatory Visit (INDEPENDENT_AMBULATORY_CARE_PROVIDER_SITE_OTHER): Payer: BC Managed Care – PPO

## 2012-10-02 VITALS — BP 131/95 | HR 94 | Ht 74.0 in | Wt 303.2 lb

## 2012-10-02 DIAGNOSIS — I4819 Other persistent atrial fibrillation: Secondary | ICD-10-CM

## 2012-10-02 DIAGNOSIS — R0989 Other specified symptoms and signs involving the circulatory and respiratory systems: Secondary | ICD-10-CM

## 2012-10-02 DIAGNOSIS — I509 Heart failure, unspecified: Secondary | ICD-10-CM

## 2012-10-02 DIAGNOSIS — I5021 Acute systolic (congestive) heart failure: Secondary | ICD-10-CM

## 2012-10-02 DIAGNOSIS — G4730 Sleep apnea, unspecified: Secondary | ICD-10-CM

## 2012-10-02 DIAGNOSIS — I4891 Unspecified atrial fibrillation: Secondary | ICD-10-CM

## 2012-10-02 LAB — BASIC METABOLIC PANEL
Calcium: 8.8 mg/dL (ref 8.4–10.5)
GFR: 77.52 mL/min (ref >60.00)
Glucose, Bld: 85 mg/dL (ref 70–99)
Potassium: 4.3 mEq/L (ref 3.5–5.1)
Sodium: 140 mEq/L (ref 135–145)

## 2012-10-02 LAB — CBC WITH DIFFERENTIAL/PLATELET
Basophils Absolute: 0 10*3/uL (ref 0.0–0.1)
Eosinophils Relative: 4.6 % (ref 0.0–5.0)
Hemoglobin: 13.3 g/dL (ref 13.0–17.0)
Lymphocytes Relative: 26.4 % (ref 12.0–46.0)
Monocytes Relative: 10.3 % (ref 3.0–12.0)
Neutro Abs: 2.5 10*3/uL (ref 1.4–7.7)
RDW: 12.9 % (ref 11.5–14.6)
WBC: 4.3 10*3/uL — ABNORMAL LOW (ref 4.5–10.5)

## 2012-10-02 LAB — MAGNESIUM: Magnesium: 2.1 mg/dL (ref 1.5–2.5)

## 2012-10-02 NOTE — Patient Instructions (Addendum)
Your physician recommends that you schedule a follow-up appointment in: one week nurse room for EKG  If till in afib will go over to the hospital for a DCCV  If in NSR will call and cancel   Your physician has recommended you make the following change in your medication:  1) In one week decrease Amiodarone to 200mg  twice daily   Your physician has recommended that you have a Cardioversion (DCCV). Electrical Cardioversion uses a jolt of electricity to your heart either through paddles or wired patches attached to your chest. This is a controlled, usually prescheduled, procedure. Defibrillation is done under light anesthesia in the hospital, and you usually go home the day of the procedure. This is done to get your heart back into a normal rhythm. You are not awake for the procedure. Please see the instruction sheet given to you today.

## 2012-10-03 NOTE — Progress Notes (Signed)
PCP: Rosario, Raymond T, MD  Joseph Garrett is a 52 y.o. male who presents today for electrophysiology followup.  Since his recent afib ablation, the patient reports doing well.  He denies procedure related complications.  He had early ERAF and was therefore placed on amiodarone with plans to follow-up today.  He remains in afib.   Today, he denies symptoms of palpitations, chest pain, shortness of breath,  lower extremity edema, dizziness, presyncope, or syncope.  The patient is otherwise without complaint today.   Past Medical History  Diagnosis Date  . Paroxysmal atrial fibrillation   . TIA (transient ischemic attack)   . Hypercholesteremia   . Thrombocytopenia   . OSA (obstructive sleep apnea)     mild, uses CPAP  . Hypertension   . Obesity   . Depression   . Gout   . Hives   . GERD (gastroesophageal reflux disease)   . Cirrhosis     liver scarring on biopsy, presumed to be due to methotrexate  . DJD (degenerative joint disease)   . Psoriasis    Past Surgical History  Procedure Laterality Date  . Cholecystectomy  2011  . Nose surgery      Turbinates  . Reconstruction of nose    . Tee without cardioversion  03/16/2012    Procedure: TRANSESOPHAGEAL ECHOCARDIOGRAM (TEE);  Surgeon: Paula V Ross, MD;  Location: [facility] END[facility]COPY;  Service: Cardiovascular;  Laterality: N/A;  . Atrial fibrillation ablation  03/17/12    PVI and CTI ablation by Dr Abshir Paolini  . Tendon repair      Right Forearm  . Tee without cardioversion N/A 09/17/2012    Procedure: TRANSESOPHAGEAL ECHOCARDIOGRAM (TEE);  Surgeon: Brian S Crenshaw, MD;  Location: [facility] ENDOSCOPY;  Service: Cardiovascular;  Laterality: N/A;  . Ablation of dysrhythmic focus  09/17/2012    Current Outpatient Prescriptions  Medication Sig Dispense Refill  . amiodarone (PACERONE) 200 MG tablet Take 2 tablets (400 mg total) by mouth 2 (two) times daily.  120 tablet  1  . carvedilol (COREG) 12.5 MG tablet Take 1 tablet (12.5 mg total) by mouth 2  (two) times daily with a meal.  60 tablet  3  . lisinopril (PRINIVIL,ZESTRIL) 5 MG tablet Take 5 mg by mouth daily.      . pantoprazole (PROTONIX) 40 MG tablet Take 1 tablet (40 mg total) by mouth daily.  60 tablet  0  . Rivaroxaban (XARELTO) 20 MG TABS Take 20 mg by mouth daily.       . simvastatin (ZOCOR) 20 MG tablet Take 20 mg by mouth every evening.       No current facility-administered medications for this visit.    Physical Exam: Filed Vitals:   10/02/12 1009  BP: 131/95  Pulse: 94  Height: 6' 2" (1.88 m)  Weight: 303 lb 3.2 oz (137.531 kg)    GEN- The patient is well appearing, alert and oriented x 3 today.   Head- normocephalic, atraumatic Eyes-  Sclera clear, conjunctiva pink Ears- hearing intact Oropharynx- clear Lungs- Clear to ausculation bilaterally, normal work of breathing Heart- irregular rate and rhythm, no murmurs, rubs or gallops, PMI not laterally displaced GI- soft, NT, ND, + BS Extremities- no clubbing, cyanosis, or edema  ekg today reveals afib, V rate 94 bpm, nonspecific ST/ T changes  Assessment and Plan:  1. afib with early ERAF post ablation He is appropriately anticoagulated with xarelto. Will continue to load with amiodarone and proceed with cardioversion. Risks, benefits, and alternatives to   cardioversion were discussed at length with the patient who wishes to proceed. We will schedule cardioversion for the next available time.  2. HTN Stable No change required today  3. OSA Compliance with CPAP advised  4. Nonischemic CM stable  

## 2012-10-06 ENCOUNTER — Telehealth: Payer: Self-pay | Admitting: Cardiology

## 2012-10-06 NOTE — Telephone Encounter (Signed)
This is Dr Jenel Lucks patient. I will forward to triage.

## 2012-10-06 NOTE — Telephone Encounter (Signed)
Pt 's EKG was rescheduled for Friday 11/14 at 9:00 AM Pt is scheduled for cardioversion at 1:00 Pm pt to arrived at short stay at 12:00 Noon. Pt aware.

## 2012-10-06 NOTE — Telephone Encounter (Signed)
Left pt a message to call back. 

## 2012-10-06 NOTE — Telephone Encounter (Signed)
New problem   Pt have question concerning him having an EKG on Thursday vs your cardioversion on Friday. He thinks that will be a problem because of his condition. Please call pt concerning this matter.

## 2012-10-08 ENCOUNTER — Ambulatory Visit: Payer: BC Managed Care – PPO

## 2012-10-09 ENCOUNTER — Ambulatory Visit (INDEPENDENT_AMBULATORY_CARE_PROVIDER_SITE_OTHER): Payer: BC Managed Care – PPO | Admitting: Internal Medicine

## 2012-10-09 ENCOUNTER — Encounter (HOSPITAL_COMMUNITY)
Admission: RE | Disposition: A | Discharge status: Home / Self Care | Payer: Self-pay | Source: Ambulatory Visit | Attending: Cardiology

## 2012-10-09 ENCOUNTER — Ambulatory Visit (HOSPITAL_COMMUNITY): Payer: BC Managed Care – PPO | Admitting: Certified Registered Nurse Anesthetist

## 2012-10-09 ENCOUNTER — Encounter (HOSPITAL_COMMUNITY): Payer: Self-pay | Admitting: Certified Registered Nurse Anesthetist

## 2012-10-09 ENCOUNTER — Ambulatory Visit (HOSPITAL_COMMUNITY)
Admission: RE | Admit: 2012-10-09 | Discharge: 2012-10-09 | Disposition: A | Discharge status: Home / Self Care | Payer: BC Managed Care – PPO | Source: Ambulatory Visit | Attending: Cardiology | Admitting: Cardiology

## 2012-10-09 VITALS — BP 110/80 | HR 88 | Resp 18

## 2012-10-09 DIAGNOSIS — I4891 Unspecified atrial fibrillation: Secondary | ICD-10-CM

## 2012-10-09 DIAGNOSIS — K7460 Unspecified cirrhosis of liver: Secondary | ICD-10-CM | POA: Insufficient documentation

## 2012-10-09 DIAGNOSIS — D696 Thrombocytopenia, unspecified: Secondary | ICD-10-CM | POA: Insufficient documentation

## 2012-10-09 DIAGNOSIS — G4733 Obstructive sleep apnea (adult) (pediatric): Secondary | ICD-10-CM | POA: Insufficient documentation

## 2012-10-09 DIAGNOSIS — E7800 Pure hypercholesterolemia, unspecified: Secondary | ICD-10-CM | POA: Insufficient documentation

## 2012-10-09 DIAGNOSIS — Z7901 Long term (current) use of anticoagulants: Secondary | ICD-10-CM | POA: Insufficient documentation

## 2012-10-09 DIAGNOSIS — I509 Heart failure, unspecified: Secondary | ICD-10-CM | POA: Insufficient documentation

## 2012-10-09 DIAGNOSIS — K219 Gastro-esophageal reflux disease without esophagitis: Secondary | ICD-10-CM | POA: Insufficient documentation

## 2012-10-09 DIAGNOSIS — I428 Other cardiomyopathies: Secondary | ICD-10-CM | POA: Insufficient documentation

## 2012-10-09 DIAGNOSIS — I10 Essential (primary) hypertension: Secondary | ICD-10-CM | POA: Insufficient documentation

## 2012-10-09 DIAGNOSIS — M1990 Unspecified osteoarthritis, unspecified site: Secondary | ICD-10-CM | POA: Insufficient documentation

## 2012-10-09 DIAGNOSIS — E669 Obesity, unspecified: Secondary | ICD-10-CM | POA: Insufficient documentation

## 2012-10-09 DIAGNOSIS — Z79899 Other long term (current) drug therapy: Secondary | ICD-10-CM | POA: Insufficient documentation

## 2012-10-09 DIAGNOSIS — L408 Other psoriasis: Secondary | ICD-10-CM | POA: Insufficient documentation

## 2012-10-09 DIAGNOSIS — M109 Gout, unspecified: Secondary | ICD-10-CM | POA: Insufficient documentation

## 2012-10-09 HISTORY — DX: Heart failure, unspecified: I50.9

## 2012-10-09 HISTORY — PX: CARDIOVERSION: SHX1299

## 2012-10-09 SURGERY — CARDIOVERSION
Anesthesia: Monitor Anesthesia Care

## 2012-10-09 MED ORDER — SODIUM CHLORIDE 0.9 % IV SOLN
INTRAVENOUS | Status: DC
  Administered 2012-10-09: 13:00:00 via INTRAVENOUS

## 2012-10-09 MED ORDER — PROPOFOL 10 MG/ML IV BOLUS
INTRAVENOUS | Status: DC | PRN
  Administered 2012-10-09: 140 mg via INTRAVENOUS

## 2012-10-09 NOTE — Addendum Note (Signed)
Addendum created 10/09/12 1429 by Hart Robinsons, MD   Modules edited: Anesthesia Attestations

## 2012-10-09 NOTE — Procedures (Signed)
Electrical Cardioversion Procedure Note Joseph Garrett [id number] 20-May-1961  Procedure: Electrical Cardioversion Indications:  Atrial Fibrillation, he has not missed any Xarelto doses.   Procedure Details Consent: Risks of procedure as well as the alternatives and risks of each were explained to the (patient/caregiver).  Consent for procedure obtained. Time Out: Verified patient identification, verified procedure, site/side was marked, verified correct patient position, special equipment/implants available, medications/allergies/relevent history reviewed, required imaging and test results available.  Performed  Patient placed on cardiac monitor, pulse oximetry, supplemental oxygen as necessary.  Sedation given: Propofol IV per anesthesiology Pacer pads placed anterior and posterior chest.  Cardioverted 3 time(s) with sternal pressure.  Cardioverted at 200J.  Evaluation Findings: Post procedure EKG shows: NSR Complications: None Patient did tolerate procedure well.   Marca Ancona 10/09/2012, 1:18 PM

## 2012-10-09 NOTE — Preoperative (Signed)
Beta Blockers   Reason not to administer Beta Blockers:Not Applicable 

## 2012-10-09 NOTE — Transfer of Care (Signed)
Immediate Anesthesia Transfer of Care Note  Patient: Joseph Garrett  Procedure(s) Performed: Procedure(s): CARDIOVERSION (N/A)  Patient Location: Endoscopy Unit  Anesthesia Type:General  Level of Consciousness: awake, alert  and oriented  Airway & Oxygen Therapy: Patient Spontanous Breathing  Post-op Assessment: Report given to PACU RN, Post -op Vital signs reviewed and stable and Patient moving all extremities  Post vital signs: Reviewed and stable  Complications: No apparent anesthesia complications

## 2012-10-09 NOTE — Interval H&P Note (Signed)
History and Physical Interval Note:  10/09/2012 1:16 PM  Joseph Garrett  has presented today for surgery, with the diagnosis of a-fib  The various methods of treatment have been discussed with the patient and family. After consideration of risks, benefits and other options for treatment, the patient has consented to  Procedure(s): CARDIOVERSION (N/A) as a surgical intervention .  The patient's history has been reviewed, patient examined, no change in status, stable for surgery.  I have reviewed the patient's chart and labs.  Questions were answered to the patient's satisfaction.     Latham Kinzler [employer]

## 2012-10-09 NOTE — Anesthesia Preprocedure Evaluation (Addendum)
Anesthesia Evaluation  Patient identified by MRN, date of birth, ID band Patient awake    Reviewed: Allergy & Precautions, H&P , NPO status , Patient's Chart, lab work & pertinent test results, reviewed documented beta blocker date and time   Airway Mallampati: II TM Distance: >3 FB Neck ROM: Full    Dental  (+) Teeth Intact and Dental Advisory Given   Pulmonary shortness of breath, sleep apnea and Continuous Positive Airway Pressure Ventilation ,  breath sounds clear to auscultation  Pulmonary exam normal       Cardiovascular hypertension, Pt. on home beta blockers and Pt. on medications +CHF + dysrhythmias Atrial Fibrillation Rhythm:Irregular Rate:Normal     Neuro/Psych PSYCHIATRIC DISORDERS Anxiety Depression TIA ?3 years ago- patient states he was placed on CPAP after that. Patient therapeutic on Xarelto TIA   GI/Hepatic GERD-  Controlled,Liver scarring thought due to methotrexate regimen   Endo/Other  negative endocrine ROS  Renal/GU negative Renal ROS     Musculoskeletal   Abdominal   Peds  Hematology negative hematology ROS (+)   Anesthesia Other Findings See surgeon's H&P  Reproductive/Obstetrics                        Anesthesia Physical Anesthesia Plan  ASA: III  Anesthesia Plan: General   Post-op Pain Management:    Induction:   Airway Management Planned: Mask  Additional Equipment:   Intra-op Plan:   Post-operative Plan:   Informed Consent:   Plan Discussed with:   Anesthesia Plan Comments:         Anesthesia Quick Evaluation

## 2012-10-09 NOTE — Anesthesia Postprocedure Evaluation (Signed)
  Anesthesia Post-op Note  Patient: Joseph Garrett  Procedure(s) Performed: Procedure(s): CARDIOVERSION (N/A)  Patient Location: Endoscopy Unit  Anesthesia Type:General  Level of Consciousness: awake, alert  and oriented  Airway and Oxygen Therapy: Patient Spontanous Breathing  Post-op Pain: none  Post-op Assessment: Post-op Vital signs reviewed, Patient's Cardiovascular Status Stable, Respiratory Function Stable, Patent Airway and No signs of Nausea or vomiting  Post-op Vital Signs: Reviewed and stable  Complications: No apparent anesthesia complications

## 2012-10-09 NOTE — H&P (View-Only) (Signed)
PCP: [name], MD  Joseph Garrett is a 52 y.o. male who presents today for electrophysiology followup.  Since his recent afib ablation, the patient reports doing well.  He denies procedure related complications.  He had early ERAF and was therefore placed on amiodarone with plans to follow-up today.  He remains in afib.   Today, he denies symptoms of palpitations, chest pain, shortness of breath,  lower extremity edema, dizziness, presyncope, or syncope.  The patient is otherwise without complaint today.   Past Medical History  Diagnosis Date  . Paroxysmal atrial fibrillation   . TIA (transient ischemic attack)   . Hypercholesteremia   . Thrombocytopenia   . OSA (obstructive sleep apnea)     mild, uses CPAP  . Hypertension   . Obesity   . Depression   . Gout   . Hives   . GERD (gastroesophageal reflux disease)   . Cirrhosis     liver scarring on biopsy, presumed to be due to methotrexate  . DJD (degenerative joint disease)   . Psoriasis    Past Surgical History  Procedure Laterality Date  . Cholecystectomy  2011  . Nose surgery      Turbinates  . Reconstruction of nose    . Tee without cardioversion  03/16/2012    Procedure: TRANSESOPHAGEAL ECHOCARDIOGRAM (TEE);  Surgeon: [name], MD;  Location: [facility] ENDOSCOPY;  Service: Cardiovascular;  Laterality: N/A;  . Atrial fibrillation ablation  03/17/12    PVI and CTI ablation by Dr [name]  . Tendon repair      Right Forearm  . Tee without cardioversion N/A 09/17/2012    Procedure: TRANSESOPHAGEAL ECHOCARDIOGRAM (TEE);  Surgeon: Lewayne Bunting, MD;  Location: [facility] ENDOSCOPY;  Service: Cardiovascular;  Laterality: N/A;  . Ablation of dysrhythmic focus  09/17/2012    Current Outpatient Prescriptions  Medication Sig Dispense Refill  . amiodarone (PACERONE) 200 MG tablet Take 2 tablets (400 mg total) by mouth 2 (two) times daily.  120 tablet  1  . carvedilol (COREG) 12.5 MG tablet Take 1 tablet (12.5 mg total) by mouth 2  (two) times daily with a meal.  60 tablet  3  . lisinopril (PRINIVIL,ZESTRIL) 5 MG tablet Take 5 mg by mouth daily.      . pantoprazole (PROTONIX) 40 MG tablet Take 1 tablet (40 mg total) by mouth daily.  60 tablet  0  . Rivaroxaban (XARELTO) 20 MG TABS Take 20 mg by mouth daily.       . simvastatin (ZOCOR) 20 MG tablet Take 20 mg by mouth every evening.       No current facility-administered medications for this visit.    Physical Exam: Filed Vitals:   10/02/12 1009  BP: 131/95  Pulse: 94  Height: 6\' 2"  (1.88 m)  Weight: 303 lb 3.2 oz (137.531 kg)    GEN- The patient is well appearing, alert and oriented x 3 today.   Head- normocephalic, atraumatic Eyes-  Sclera clear, conjunctiva pink Ears- hearing intact Oropharynx- clear Lungs- Clear to ausculation bilaterally, normal work of breathing Heart- irregular rate and rhythm, no murmurs, rubs or gallops, PMI not laterally displaced GI- soft, NT, ND, + BS Extremities- no clubbing, cyanosis, or edema  ekg today reveals afib, V rate 94 bpm, nonspecific ST/ T changes  Assessment and Plan:  1. afib with early ERAF post ablation He is appropriately anticoagulated with xarelto. Will continue to load with amiodarone and proceed with cardioversion. Risks, benefits, and alternatives to  cardioversion were discussed at length with the patient who wishes to proceed. We will schedule cardioversion for the next available time.  2. HTN Stable No change required today  3. OSA Compliance with CPAP advised  4. Nonischemic CM stable

## 2012-10-09 NOTE — Progress Notes (Signed)
Patient here for EKG for pre cardioversion. Rhythm shows atrial fib. Advised to report for cardioversion at 12 noon today at [facility] and enter thru outpatient admitting then endo unit. Reinforced no food or drink until after cardioversion.

## 2012-10-12 ENCOUNTER — Encounter (HOSPITAL_COMMUNITY): Payer: Self-pay | Admitting: Cardiology

## 2012-11-04 ENCOUNTER — Telehealth: Payer: Self-pay | Admitting: Internal Medicine

## 2012-11-04 NOTE — Telephone Encounter (Signed)
Received 6 pages from [facility], sent to Dr. Tenny Craw. 11/04/12/ss

## 2012-11-18 ENCOUNTER — Telehealth: Payer: Self-pay

## 2012-11-18 MED ORDER — AMIODARONE HCL 200 MG PO TABS: 200.0000 mg | ORAL_TABLET | Freq: Two times a day (BID) | ORAL | Status: DC

## 2012-11-18 NOTE — Telephone Encounter (Signed)
amiodarone (PACERONE) 200 MG tablet  Take 2 tablets (400 mg total) by mouth 2 (two) times daily.   120 tablet   1    Patient Instructions    Your physician recommends that you schedule a follow-up appointment in: one week nurse room for EKG  If till in afib will go over to the hospital for a DCCV  If in NSR will call and cancel  Your physician has recommended you make the following change in your medication:   1) In one week decrease Amiodarone to 200mg  twice daily  Your physician has recommended that you have a Cardioversion (DCCV). Electrical Cardioversion uses a jolt of electricity to your heart either through paddles or wired patches attached to your chest. This is a controlled, usually prescheduled, procedure. Defibrillation is done under light anesthesia in the hospital, and you usually go home the day of the procedure. This is done to get your heart back into a normal rhythm. You are not awake for the procedure. Please see the instruction sheet given to you today.  Patient Instructions History Recorded     Previous Visit      Provider Department Encounter #    09/23/2012 11:30 AM Hillis Range, MD [facility] Health Info Mgmt [id number]

## 2012-11-25 ENCOUNTER — Telehealth: Payer: Self-pay | Admitting: Internal Medicine

## 2012-11-25 MED ORDER — LISINOPRIL 5 MG PO TABS: 5.0000 mg | ORAL_TABLET | Freq: Every day | ORAL | Status: DC

## 2012-11-25 MED ORDER — PANTOPRAZOLE SODIUM 40 MG PO TBEC: 40.0000 mg | DELAYED_RELEASE_TABLET | Freq: Every day | ORAL | Status: DC

## 2012-11-25 MED ORDER — SIMVASTATIN 20 MG PO TABS: 20.0000 mg | ORAL_TABLET | Freq: Every evening | ORAL | Status: DC

## 2012-11-25 NOTE — Telephone Encounter (Signed)
All medications called in and patient aware

## 2012-11-25 NOTE — Telephone Encounter (Signed)
New problem    Pt isn't for sure if these meds are to be refilled: simdastatin 20 mg, lisinopril 5mg , & pantoprazole 40 [phone] [facility] if they need to be refilled--pt also wants to know if he can drink caffeine

## 2012-12-21 ENCOUNTER — Ambulatory Visit (INDEPENDENT_AMBULATORY_CARE_PROVIDER_SITE_OTHER): Payer: BC Managed Care – PPO | Admitting: Internal Medicine

## 2012-12-21 ENCOUNTER — Encounter: Payer: Self-pay | Admitting: Internal Medicine

## 2012-12-21 VITALS — BP 134/78 | HR 46 | Ht 74.0 in | Wt 304.4 lb

## 2012-12-21 DIAGNOSIS — I4891 Unspecified atrial fibrillation: Secondary | ICD-10-CM

## 2012-12-21 LAB — HEPATIC FUNCTION PANEL
AST: 21 U/L (ref 0–37)
Albumin: 4.1 g/dL (ref 3.5–5.2)
Alkaline Phosphatase: 60 U/L (ref 39–117)
Total Bilirubin: 1.5 mg/dL — ABNORMAL HIGH (ref 0.3–1.2)

## 2012-12-21 LAB — BASIC METABOLIC PANEL
BUN: 14 mg/dL (ref 6–23)
CO2: 28 mEq/L (ref 19–32)
Calcium: 9.2 mg/dL (ref 8.4–10.5)
Chloride: 109 mEq/L (ref 96–112)
Creatinine, Ser: 1.2 mg/dL (ref 0.4–1.5)
GFR: 69.8 mL/min (ref >60.00)
Glucose, Bld: 96 mg/dL (ref 70–99)

## 2012-12-21 MED ORDER — AMIODARONE HCL 200 MG PO TABS: 200.0000 mg | ORAL_TABLET | Freq: Every day | ORAL | Status: DC

## 2012-12-21 MED ORDER — LISINOPRIL 10 MG PO TABS: 10.0000 mg | ORAL_TABLET | Freq: Every day | ORAL | Status: DC

## 2012-12-21 NOTE — Patient Instructions (Addendum)
Your physician recommends that you schedule a follow-up appointment in: 4 months with Dr [name]  Your physician recommends that you return for lab work today: BMP/liver/TSH/T4  Your physician has recommended you make the following change in your medication:  1) Decrease Amiodarone to 200mg  daily 2) Increase Lisinopril to 10mg  daily

## 2012-12-21 NOTE — Progress Notes (Signed)
PCP: Joseph Garrett is a 52 y.o. male who presents today for electrophysiology followup.  Since his recent afib ablation, the patient reports doing well.  He denies procedure related complications.  He had ERAF 4/14 and required amiodarone and cardioversion.  He has done very well, without afib recurrence since that time.   Today, he denies symptoms of palpitations, chest pain, shortness of breath,  lower extremity edema, dizziness, presyncope, or syncope.  The patient is otherwise without complaint today.   Past Medical History  Diagnosis Date  . Paroxysmal atrial fibrillation   . TIA (transient ischemic attack)   . Hypercholesteremia   . Thrombocytopenia   . Hypertension   . Obesity   . Depression   . Gout   . Hives   . GERD (gastroesophageal reflux disease)   . Cirrhosis     liver scarring on biopsy, presumed to be due to methotrexate  . Psoriasis   . OSA (obstructive sleep apnea)     mild, uses CPAP  . CHF (congestive heart failure)   . DJD (degenerative joint disease)    Past Surgical History  Procedure Laterality Date  . Cholecystectomy  2011  . Nose surgery      Turbinates  . Reconstruction of nose    . Tee without cardioversion  03/16/2012    Procedure: TRANSESOPHAGEAL ECHOCARDIOGRAM (TEE);  Surgeon: [name], MD;  Location: [facility] ENDOSCOPY;  Service: Cardiovascular;  Laterality: N/A;  . Atrial fibrillation ablation  03/17/12    PVI and CTI ablation by Dr [name]  . Tendon repair      Right Forearm  . Tee without cardioversion N/A 09/17/2012    Procedure: TRANSESOPHAGEAL ECHOCARDIOGRAM (TEE);  Surgeon: Lewayne Bunting, MD;  Location: [facility] ENDOSCOPY;  Service: Cardiovascular;  Laterality: N/A;  . Ablation of dysrhythmic focus  09/17/2012  . Cardioversion N/A 10/09/2012    Procedure: CARDIOVERSION;  Surgeon: Laurey Morale, MD;  Location: [facility] ENDOSCOPY;  Service: Cardiovascular;  Laterality: N/A;    Current Outpatient Prescriptions  Medication Sig Dispense  Refill  . amiodarone (PACERONE) 200 MG tablet Take 1 tablet (200 mg total) by mouth 2 (two) times daily.  180 tablet  3  . carvedilol (COREG) 12.5 MG tablet Take 1 tablet (12.5 mg total) by mouth 2 (two) times daily with a meal.  60 tablet  3  . lisinopril (PRINIVIL,ZESTRIL) 5 MG tablet Take 1 tablet (5 mg total) by mouth daily.  90 tablet  3  . pantoprazole (PROTONIX) 40 MG tablet Take 1 tablet (40 mg total) by mouth daily.  90 tablet  3  . Rivaroxaban (XARELTO) 20 MG TABS Take 20 mg by mouth daily.       . simvastatin (ZOCOR) 20 MG tablet Take 1 tablet (20 mg total) by mouth every evening.  90 tablet  3   No current facility-administered medications for this visit.    Physical Exam: Filed Vitals:   12/21/12 1116  BP: 134/78  Pulse: 46  Height: 6\' 2"  (1.88 m)  Weight: 304 lb 6.4 oz (138.075 kg)    GEN- The patient is well appearing, alert and oriented x 3 today.   Head- normocephalic, atraumatic Eyes-  Sclera clear, conjunctiva pink Ears- hearing intact Oropharynx- clear Lungs- Clear to ausculation bilaterally, normal work of breathing Heart- irregular rate and rhythm, no murmurs, rubs or gallops, PMI not laterally displaced GI- soft, NT, ND, + BS Extremities- no clubbing, cyanosis, or edema  ekg today reveals afib, V  rate 94 bpm, nonspecific ST/ T changes  Assessment and Plan:  1. afib with early ERAF post ablation He is appropriately anticoagulated with xarelto (check bmet and CBC today) Decrease amiodarone to 200mg  daily today Check lfts and tfts today Decrease amiodarone to 100mg  daily upon return in 4 months Stop PPI  2. HTN He reports occasional elevation in BP at home Increase lisinopril to 10mg  daily today  3. OSA Compliance with CPAP advised  4. Nonischemic CM stable Repeat echo upon return  Return in 4 months

## 2013-01-13 ENCOUNTER — Other Ambulatory Visit: Payer: Self-pay

## 2013-01-13 MED ORDER — CARVEDILOL 12.5 MG PO TABS: 12.5000 mg | ORAL_TABLET | Freq: Two times a day (BID) | ORAL | Status: DC

## 2013-02-19 ENCOUNTER — Telehealth: Payer: Self-pay | Admitting: Internal Medicine

## 2013-02-19 NOTE — Telephone Encounter (Signed)
Follow up  Pt is wanting to speak with you, he did not say what it was concerning.

## 2013-02-19 NOTE — Telephone Encounter (Signed)
lmom for patient to return my call 

## 2013-02-24 NOTE — Telephone Encounter (Signed)
Follow up  ° ° ° ° °Pt is returning your call  °

## 2013-02-24 NOTE — Telephone Encounter (Signed)
Has noticed some bleeding on and off for the last several months.  Has also had a bruise that has come up on his leg and then his eye.  The last time was a week ago, lasting for a day then it stopped.

## 2013-02-26 NOTE — Telephone Encounter (Signed)
Spoke with patient and let him know I discussed with Dr [name] and if the rectal bleeding continues then he needs to follow up with his PCP to have his stools checked.  Patient aware and verbalized understanding

## 2013-04-12 ENCOUNTER — Telehealth: Payer: Self-pay | Admitting: Internal Medicine

## 2013-04-12 NOTE — Telephone Encounter (Signed)
Spoke with pt.  He states his L knee is swollen, bruised, red and warm to the touch.  He is concerned about the Xarelto causing the bleeding.  Based on symptoms, concerned for process other than bleeding.  Suggested pt follow up with PCP or walk-in orthopedic office today.  He is agreeable.

## 2013-04-12 NOTE — Telephone Encounter (Signed)
New message   On xarelto and pt states he is bleeding---

## 2013-05-06 ENCOUNTER — Other Ambulatory Visit: Payer: Self-pay

## 2013-05-10 ENCOUNTER — Telehealth: Payer: Self-pay | Admitting: Internal Medicine

## 2013-05-10 NOTE — Telephone Encounter (Signed)
Per Dr [name] hold 24 hours before and resume 24 hours post due history of TIA TIA

## 2013-05-10 NOTE — Telephone Encounter (Signed)
New problem   Can pt hold Xarelto 4day prior to colonoscopy. Office need to know by today. Please advise

## 2013-06-01 ENCOUNTER — Other Ambulatory Visit: Payer: Self-pay | Admitting: Internal Medicine

## 2013-08-27 ENCOUNTER — Ambulatory Visit (INDEPENDENT_AMBULATORY_CARE_PROVIDER_SITE_OTHER): Payer: BC Managed Care – PPO | Admitting: Internal Medicine

## 2013-08-27 ENCOUNTER — Encounter: Payer: Self-pay | Admitting: Internal Medicine

## 2013-08-27 VITALS — BP 116/78 | HR 53 | Ht 74.5 in | Wt 297.0 lb

## 2013-08-27 DIAGNOSIS — I10 Essential (primary) hypertension: Secondary | ICD-10-CM

## 2013-08-27 DIAGNOSIS — R5383 Other fatigue: Secondary | ICD-10-CM

## 2013-08-27 DIAGNOSIS — I4891 Unspecified atrial fibrillation: Secondary | ICD-10-CM

## 2013-08-27 DIAGNOSIS — R001 Bradycardia, unspecified: Secondary | ICD-10-CM

## 2013-08-27 DIAGNOSIS — I498 Other specified cardiac arrhythmias: Secondary | ICD-10-CM

## 2013-08-27 DIAGNOSIS — R5381 Other malaise: Secondary | ICD-10-CM

## 2013-08-27 DIAGNOSIS — G4733 Obstructive sleep apnea (adult) (pediatric): Secondary | ICD-10-CM

## 2013-08-27 DIAGNOSIS — I4819 Other persistent atrial fibrillation: Secondary | ICD-10-CM

## 2013-08-27 DIAGNOSIS — I519 Heart disease, unspecified: Secondary | ICD-10-CM

## 2013-08-27 LAB — CBC WITH DIFFERENTIAL/PLATELET
BASOS PCT: 0.5 % (ref 0.0–3.0)
Basophils Absolute: 0 10*3/uL (ref 0.0–0.1)
EOS PCT: 4.1 % (ref 0.0–5.0)
Eosinophils Absolute: 0.2 10*3/uL (ref 0.0–0.7)
HCT: 43 % (ref 39.0–52.0)
Hemoglobin: 14.2 g/dL (ref 13.0–17.0)
LYMPHS PCT: 16.6 % (ref 12.0–46.0)
Lymphs Abs: 0.9 10*3/uL (ref 0.7–4.0)
MCHC: 33 g/dL (ref 30.0–36.0)
MCV: 95.5 fl (ref 78.0–100.0)
MONO ABS: 0.5 10*3/uL (ref 0.1–1.0)
Monocytes Relative: 10.4 % (ref 3.0–12.0)
Neutro Abs: 3.6 10*3/uL (ref 1.4–7.7)
Neutrophils Relative %: 68.4 % (ref 43.0–77.0)
Platelets: 169 10*3/uL (ref 150.0–400.0)
RBC: 4.5 Mil/uL (ref 4.22–5.81)
RDW: 13.2 % (ref 11.5–14.6)
WBC: 5.3 10*3/uL (ref 4.5–10.5)

## 2013-08-27 LAB — BASIC METABOLIC PANEL
BUN: 15 mg/dL (ref 6–23)
CO2: 30 mEq/L (ref 19–32)
Calcium: 9.7 mg/dL (ref 8.4–10.5)
Chloride: 104 mEq/L (ref 96–112)
Creatinine, Ser: 1.3 mg/dL (ref 0.4–1.5)
GFR: 61.59 mL/min (ref >60.00)
Glucose, Bld: 89 mg/dL (ref 70–99)
Potassium: 4.8 mEq/L (ref 3.5–5.1)
SODIUM: 139 meq/L (ref 135–145)

## 2013-08-27 LAB — HEPATIC FUNCTION PANEL
ALT: 30 U/L (ref 0–53)
AST: 18 U/L (ref 0–37)
Albumin: 4.4 g/dL (ref 3.5–5.2)
Alkaline Phosphatase: 54 U/L (ref 39–117)
BILIRUBIN TOTAL: 1.4 mg/dL — AB (ref 0.3–1.2)
Bilirubin, Direct: 0.1 mg/dL (ref 0.0–0.3)
Total Protein: 7.1 g/dL (ref 6.0–8.3)

## 2013-08-27 LAB — TSH: TSH: 1.4 u[IU]/mL (ref 0.35–5.50)

## 2013-08-27 LAB — T4, FREE: Free T4: 1.18 ng/dL (ref 0.60–1.60)

## 2013-08-27 MED ORDER — CARVEDILOL 12.5 MG PO TABS: 6.2500 mg | ORAL_TABLET | Freq: Two times a day (BID) | ORAL | Status: DC

## 2013-08-27 MED ORDER — AMIODARONE HCL 200 MG PO TABS: 100.0000 mg | ORAL_TABLET | Freq: Two times a day (BID) | ORAL | Status: DC

## 2013-08-27 NOTE — Patient Instructions (Addendum)
Your physician recommends that you schedule a follow-up appointment in: 3 months with Dr [name]  Your physician has requested that you have an echocardiogram. Echocardiography is a painless test that uses sound waves to create images of your heart. It provides your doctor with information about the size and shape of your heart and how well your heart's chambers and valves are working. This procedure takes approximately one hour. There are no restrictions for this procedure.  Your physician recommends that you return for lab work today: TSH/T4/BMP/CBC/LIVER  Your physician has recommended you make the following change in your medication:  1) Decrease Amiodarone to 100mg  daily 2) Decrease Carvedilol to 6.25mg  twice daily

## 2013-08-30 DIAGNOSIS — R001 Bradycardia, unspecified: Secondary | ICD-10-CM | POA: Insufficient documentation

## 2013-08-30 DIAGNOSIS — I10 Essential (primary) hypertension: Secondary | ICD-10-CM

## 2013-08-30 DIAGNOSIS — R5383 Other fatigue: Secondary | ICD-10-CM | POA: Insufficient documentation

## 2013-08-30 DIAGNOSIS — I428 Other cardiomyopathies: Secondary | ICD-10-CM | POA: Insufficient documentation

## 2013-08-30 HISTORY — DX: Essential (primary) hypertension: I10

## 2013-08-30 NOTE — Progress Notes (Signed)
PCP: Joseph Garrett is a 53 y.o. male who presents today for electrophysiology followup.  Since his recent afib ablation, the patient reports doing well.    He has done very well, without afib recurrence since his last visit.   Unfortunately, he has been noncompliant with follow-up.  Today, he denies symptoms of palpitations, chest pain, shortness of breath,  lower extremity edema, dizziness, presyncope, or syncope.  The patient is otherwise without complaint today.   Past Medical History  Diagnosis Date  . Paroxysmal atrial fibrillation   . TIA (transient ischemic attack)   . Hypercholesteremia   . Thrombocytopenia   . Hypertension   . Obesity   . Depression   . Gout   . Hives   . GERD (gastroesophageal reflux disease)   . Cirrhosis     liver scarring on biopsy, presumed to be due to methotrexate  . Psoriasis   . OSA (obstructive sleep apnea)     mild, uses CPAP  . CHF (congestive heart failure)   . DJD (degenerative joint disease)    Past Surgical History  Procedure Laterality Date  . Cholecystectomy  2011  . Nose surgery      Turbinates  . Reconstruction of nose    . Tee without cardioversion  03/16/2012    Procedure: TRANSESOPHAGEAL ECHOCARDIOGRAM (TEE);  Surgeon: [name], MD;  Location: [facility] ENDOSCOPY;  Service: Cardiovascular;  Laterality: N/A;  . Atrial fibrillation ablation  03/17/12    PVI and CTI ablation by Dr [name]  . Tendon repair      Right Forearm  . Tee without cardioversion N/A 09/17/2012    Procedure: TRANSESOPHAGEAL ECHOCARDIOGRAM (TEE);  Surgeon: Lewayne Bunting, MD;  Location: [facility] ENDOSCOPY;  Service: Cardiovascular;  Laterality: N/A;  . Ablation of dysrhythmic focus  09/17/2012  . Cardioversion N/A 10/09/2012    Procedure: CARDIOVERSION;  Surgeon: Laurey Morale, MD;  Location: [facility] ENDOSCOPY;  Service: Cardiovascular;  Laterality: N/A;    Current Outpatient Prescriptions  Medication Sig Dispense Refill  . amiodarone (PACERONE) 200 MG  tablet Take 0.5 tablets (100 mg total) by mouth 2 (two) times daily.      . carvedilol (COREG) 12.5 MG tablet Take 0.5 tablets (6.25 mg total) by mouth 2 (two) times daily with a meal.  60 tablet  3  . febuxostat (ULORIC) 40 MG tablet Take 40 mg by mouth daily.      Marland Kitchen lisinopril (PRINIVIL,ZESTRIL) 10 MG tablet Take 1 tablet (10 mg total) by mouth daily.  90 tablet  3  . Rivaroxaban (XARELTO) 20 MG TABS Take 20 mg by mouth daily.       . sertraline (ZOLOFT) 100 MG tablet Take 150 mg by mouth daily.      . simvastatin (ZOCOR) 20 MG tablet Take 1 tablet (20 mg total) by mouth every evening.  90 tablet  3  . tamsulosin (FLOMAX) 0.4 MG CAPS capsule Take 0.4 mg by mouth daily.      Marland Kitchen tolterodine (DETROL LA) 4 MG 24 hr capsule Take 4 mg by mouth daily.      . traZODone (DESYREL) 100 MG tablet Take 100 mg by mouth at bedtime.      Marland Kitchen zolpidem (AMBIEN) 10 MG tablet Take 10 mg by mouth at bedtime.       No current facility-administered medications for this visit.    Physical Exam: Filed Vitals:   08/27/13 1119  BP: 116/78  Pulse: 53  Height: 6' 2.5" (1.892  m)  Weight: 297 lb (134.718 kg)    GEN- The patient is well appearing, alert and oriented x 3 today.   Head- normocephalic, atraumatic Eyes-  Sclera clear, conjunctiva pink Ears- hearing intact Oropharynx- clear Lungs- Clear to ausculation bilaterally, normal work of breathing Heart- regular rate and rhythm, no murmurs, rubs or gallops, PMI not laterally displaced GI- soft, NT, ND, + BS Extremities- no clubbing, cyanosis, or edema  ekg today reveals sinus rhythm 53 bpm, otherwise normal ekg  Assessment and Plan:  1. afib  Maintaining sinus rhythm He is appropriately anticoagulated with xarelto (check bmet and CBC today) Decrease amiodarone to 100mg  daily today Check lfts and tfts today  2. HTN Stable No change required today  3. OSA Compliance with CPAP advised  4. Nonischemic CM stable Repeat echo at this time  5.  Bradycardia He has some fatigue Decrease amiodarone as above Decrease coreg to 6.25mg  BID  Return in 3 months

## 2013-08-31 ENCOUNTER — Other Ambulatory Visit: Payer: Self-pay | Admitting: *Deleted

## 2013-08-31 MED ORDER — AMIODARONE HCL 200 MG PO TABS: 100.0000 mg | ORAL_TABLET | Freq: Every day | ORAL | Status: DC

## 2013-09-13 ENCOUNTER — Other Ambulatory Visit (HOSPITAL_COMMUNITY): Payer: Self-pay | Admitting: Cardiology

## 2013-09-16 ENCOUNTER — Other Ambulatory Visit (HOSPITAL_COMMUNITY): Payer: Self-pay | Admitting: Internal Medicine

## 2013-09-16 ENCOUNTER — Ambulatory Visit (HOSPITAL_COMMUNITY): Payer: BC Managed Care – PPO | Attending: Cardiology | Admitting: Radiology

## 2013-09-16 DIAGNOSIS — I519 Heart disease, unspecified: Secondary | ICD-10-CM

## 2013-09-16 DIAGNOSIS — I10 Essential (primary) hypertension: Secondary | ICD-10-CM | POA: Insufficient documentation

## 2013-09-16 DIAGNOSIS — R0602 Shortness of breath: Secondary | ICD-10-CM

## 2013-09-16 DIAGNOSIS — I4891 Unspecified atrial fibrillation: Secondary | ICD-10-CM

## 2013-09-16 DIAGNOSIS — Z8673 Personal history of transient ischemic attack (TIA), and cerebral infarction without residual deficits: Secondary | ICD-10-CM | POA: Insufficient documentation

## 2013-09-16 DIAGNOSIS — I5021 Acute systolic (congestive) heart failure: Secondary | ICD-10-CM

## 2013-09-16 DIAGNOSIS — G4733 Obstructive sleep apnea (adult) (pediatric): Secondary | ICD-10-CM | POA: Insufficient documentation

## 2013-09-16 NOTE — Progress Notes (Signed)
Echocardiogram performed.  

## 2013-09-30 ENCOUNTER — Other Ambulatory Visit: Payer: Self-pay | Admitting: Internal Medicine

## 2013-11-24 ENCOUNTER — Ambulatory Visit: Payer: BC Managed Care – PPO | Admitting: Internal Medicine

## 2013-12-01 ENCOUNTER — Telehealth: Payer: Self-pay | Admitting: Internal Medicine

## 2013-12-01 MED ORDER — RIVAROXABAN 20 MG PO TABS: ORAL_TABLET | ORAL | Status: DC

## 2013-12-01 NOTE — Telephone Encounter (Signed)
New message     Pt has afib.  Another doctor put him on xarelto.  The xarelto is almost out and he is not seeing that doctor again.  Pt has 10 days of xarelto left.  Will Dr [name] refill xarelto--[facility]/[place].  Pt will see Dr [name] in July.

## 2013-12-01 NOTE — Telephone Encounter (Signed)
Will give him enough to get to his appointment

## 2013-12-02 ENCOUNTER — Other Ambulatory Visit: Payer: Self-pay | Admitting: Internal Medicine

## 2013-12-03 ENCOUNTER — Ambulatory Visit: Payer: BC Managed Care – PPO | Admitting: Internal Medicine

## 2013-12-09 ENCOUNTER — Other Ambulatory Visit: Payer: Self-pay | Admitting: Internal Medicine

## 2013-12-27 ENCOUNTER — Other Ambulatory Visit: Payer: Self-pay | Admitting: Internal Medicine

## 2013-12-29 ENCOUNTER — Ambulatory Visit (INDEPENDENT_AMBULATORY_CARE_PROVIDER_SITE_OTHER): Payer: BC Managed Care – PPO | Admitting: Internal Medicine

## 2013-12-29 ENCOUNTER — Encounter: Payer: Self-pay | Admitting: Internal Medicine

## 2013-12-29 VITALS — BP 132/79 | HR 57 | Ht 74.5 in | Wt 233.0 lb

## 2013-12-29 DIAGNOSIS — I4819 Other persistent atrial fibrillation: Secondary | ICD-10-CM

## 2013-12-29 DIAGNOSIS — G4733 Obstructive sleep apnea (adult) (pediatric): Secondary | ICD-10-CM

## 2013-12-29 DIAGNOSIS — R001 Bradycardia, unspecified: Secondary | ICD-10-CM

## 2013-12-29 DIAGNOSIS — I10 Essential (primary) hypertension: Secondary | ICD-10-CM

## 2013-12-29 DIAGNOSIS — I498 Other specified cardiac arrhythmias: Secondary | ICD-10-CM

## 2013-12-29 DIAGNOSIS — I4891 Unspecified atrial fibrillation: Secondary | ICD-10-CM

## 2013-12-29 DIAGNOSIS — I519 Heart disease, unspecified: Secondary | ICD-10-CM

## 2013-12-29 LAB — HEPATIC FUNCTION PANEL
ALT: 23 U/L (ref 0–53)
AST: 17 U/L (ref 0–37)
Albumin: 3.8 g/dL (ref 3.5–5.2)
Alkaline Phosphatase: 45 U/L (ref 39–117)
BILIRUBIN TOTAL: 0.6 mg/dL (ref 0.2–1.2)
Bilirubin, Direct: 0 mg/dL (ref 0.0–0.3)
Total Protein: 6.1 g/dL (ref 6.0–8.3)

## 2013-12-29 LAB — BASIC METABOLIC PANEL
BUN: 14 mg/dL (ref 6–23)
CALCIUM: 8.9 mg/dL (ref 8.4–10.5)
CO2: 28 mEq/L (ref 19–32)
Chloride: 109 mEq/L (ref 96–112)
Creatinine, Ser: 1.2 mg/dL (ref 0.4–1.5)
GFR: 67.52 mL/min (ref >60.00)
GLUCOSE: 91 mg/dL (ref 70–99)
Potassium: 4.1 mEq/L (ref 3.5–5.1)
Sodium: 142 mEq/L (ref 135–145)

## 2013-12-29 LAB — TSH: TSH: 0.93 u[IU]/mL (ref 0.35–4.50)

## 2013-12-29 LAB — T4, FREE: Free T4: 0.83 ng/dL (ref 0.60–1.60)

## 2013-12-29 NOTE — Progress Notes (Signed)
PCP: Joseph Garrett is a 53 y.o. male who presents today for electrophysiology followup.  Since his recent afib ablation, the patient reports doing well.    He has done very well, with minimal afib occurrence. Continues on Xarelto with  CHADS2VASC score of 3. No unusual bleeding issues. Echo in 2014 with EF of 25-30 % with Echo in March showing improvement to 45-50%. Continues on ace and bb.  On amiodarone 100 mg a day.  Today, he denies symptoms of palpitations, chest pain, shortness of breath,  lower extremity edema, dizziness, presyncope, or syncope.  The patient is otherwise without complaint today.   Past Medical History  Diagnosis Date  . Paroxysmal atrial fibrillation   . TIA (transient ischemic attack)   . Hypercholesteremia   . Thrombocytopenia   . Hypertension   . Obesity   . Depression   . Gout   . Hives   . GERD (gastroesophageal reflux disease)   . Cirrhosis     liver scarring on biopsy, presumed to be due to methotrexate  . Psoriasis   . OSA (obstructive sleep apnea)     mild, uses CPAP  . CHF (congestive heart failure)   . DJD (degenerative joint disease)    Past Surgical History  Procedure Laterality Date  . Cholecystectomy  2011  . Nose surgery      Turbinates  . Reconstruction of nose    . Tee without cardioversion  03/16/2012    Procedure: TRANSESOPHAGEAL ECHOCARDIOGRAM (TEE);  Surgeon: [name], MD;  Location: [facility] ENDOSCOPY;  Service: Cardiovascular;  Laterality: N/A;  . Atrial fibrillation ablation  03/17/12    PVI and CTI ablation by Dr [name]  . Tendon repair      Right Forearm  . Tee without cardioversion N/A 09/17/2012    Procedure: TRANSESOPHAGEAL ECHOCARDIOGRAM (TEE);  Surgeon: Lewayne Bunting, MD;  Location: [facility] ENDOSCOPY;  Service: Cardiovascular;  Laterality: N/A;  . Ablation of dysrhythmic focus  09/17/2012  . Cardioversion N/A 10/09/2012    Procedure: CARDIOVERSION;  Surgeon: Laurey Morale, MD;  Location: [facility] ENDOSCOPY;  Service:  Cardiovascular;  Laterality: N/A;    Current Outpatient Prescriptions  Medication Sig Dispense Refill  . amiodarone (PACERONE) 200 MG tablet Take 200 mg by mouth daily.      . carvedilol (COREG) 12.5 MG tablet Take 12.5 mg by mouth 2 (two) times daily with a meal.      . febuxostat (ULORIC) 40 MG tablet Take 40 mg by mouth daily.      Marland Kitchen lisinopril (PRINIVIL,ZESTRIL) 10 MG tablet TAKE 1 TABLET BY MOUTH EVERY DAY  30 tablet  0  . rivaroxaban (XARELTO) 20 MG TABS tablet Take 20 mg by mouth daily.      . sertraline (ZOLOFT) 100 MG tablet Take 150 mg by mouth daily.      . simvastatin (ZOCOR) 20 MG tablet TAKE 1 TABLET BY MOUTH EVERY EVENING  90 tablet  0  . tamsulosin (FLOMAX) 0.4 MG CAPS capsule Take 0.4 mg by mouth daily.      Marland Kitchen tolterodine (DETROL LA) 4 MG 24 hr capsule Take 4 mg by mouth daily.      . traZODone (DESYREL) 100 MG tablet Take 100 mg by mouth at bedtime.      Marland Kitchen zolpidem (AMBIEN) 10 MG tablet Take 10 mg by mouth at bedtime.       No current facility-administered medications for this visit.    Physical Exam: Filed Vitals:  12/29/13 0859  BP: 132/79  Pulse: 57  Height: 6' 2.5" (1.892 m)  Weight: 105.688 kg (233 lb)    GEN- The patient is well appearing, alert and oriented x 3 today.   Head- normocephalic, atraumatic Eyes-  Sclera clear, conjunctiva pink Ears- hearing intact Oropharynx- clear Lungs- Clear to ausculation bilaterally, normal work of breathing Heart- regular rate and rhythm, no murmurs, rubs or gallops, PMI not laterally displaced GI- soft, NT, ND, + BS Extremities- no clubbing, cyanosis, or edema  ekg today reveals sinus brady @  57, otherwise normal.  Assessment and Plan:  1. afib  Maintaining sinus rhythm He is appropriately anticoagulated with xarelto (check bmet, LFT's, TSH,T4 today) and in 6 months on f/u. Will likely need long term anticoagulation due to h/o TIA. Continue amiodarone  at 100mg  daily today   2. HTN Stable No change  required today  3. OSA Compliance with CPAP advised  4. Nonischemic CM stable Repeat echo  In 6 months on f/u.  5. Bradycardia Currently asymptomatic.  Return in 6 months

## 2013-12-29 NOTE — Patient Instructions (Signed)
Your physician wants you to follow-up in: 6 months with Rudi Coco, NP You will receive a reminder letter in the mail two months in advance. If you don't receive a letter, please call our office to schedule the follow-up appointment.  Your physician recommends that you return for lab work in: BMP/TSH/T4/LIVER

## 2013-12-31 ENCOUNTER — Other Ambulatory Visit: Payer: Self-pay | Admitting: Internal Medicine

## 2014-01-11 ENCOUNTER — Other Ambulatory Visit: Payer: Self-pay | Admitting: Internal Medicine

## 2014-01-30 ENCOUNTER — Other Ambulatory Visit: Payer: Self-pay | Admitting: Internal Medicine

## 2014-02-11 ENCOUNTER — Telehealth: Payer: Self-pay | Admitting: Internal Medicine

## 2014-02-11 NOTE — Telephone Encounter (Signed)
Received request from Nurse fax box, documents faxed for surgical clearance. To: [facility]  Fax number: [phone] Attention:[phone]/km

## 2014-02-21 ENCOUNTER — Encounter: Payer: Self-pay | Admitting: Internal Medicine

## 2014-02-21 ENCOUNTER — Encounter (HOSPITAL_COMMUNITY): Payer: Self-pay | Admitting: Pharmacy Technician

## 2014-02-21 ENCOUNTER — Ambulatory Visit (INDEPENDENT_AMBULATORY_CARE_PROVIDER_SITE_OTHER): Payer: PRIVATE HEALTH INSURANCE | Admitting: Internal Medicine

## 2014-02-21 ENCOUNTER — Encounter: Payer: Self-pay | Admitting: *Deleted

## 2014-02-21 VITALS — BP 112/74 | HR 97 | Ht 74.0 in | Wt 271.8 lb

## 2014-02-21 DIAGNOSIS — I10 Essential (primary) hypertension: Secondary | ICD-10-CM

## 2014-02-21 DIAGNOSIS — I4819 Other persistent atrial fibrillation: Secondary | ICD-10-CM

## 2014-02-21 DIAGNOSIS — I4891 Unspecified atrial fibrillation: Secondary | ICD-10-CM

## 2014-02-21 DIAGNOSIS — I519 Heart disease, unspecified: Secondary | ICD-10-CM

## 2014-02-21 DIAGNOSIS — R55 Syncope and collapse: Secondary | ICD-10-CM

## 2014-02-21 DIAGNOSIS — G4733 Obstructive sleep apnea (adult) (pediatric): Secondary | ICD-10-CM

## 2014-02-21 HISTORY — DX: Syncope and collapse: R55

## 2014-02-21 LAB — BASIC METABOLIC PANEL
BUN: 21 mg/dL (ref 6–23)
CALCIUM: 9.3 mg/dL (ref 8.4–10.5)
CO2: 27 meq/L (ref 19–32)
CREATININE: 1.1 mg/dL (ref 0.4–1.5)
Chloride: 105 mEq/L (ref 96–112)
GFR: 72.37 mL/min (ref >60.00)
GLUCOSE: 78 mg/dL (ref 70–99)
Potassium: 4.2 mEq/L (ref 3.5–5.1)
Sodium: 138 mEq/L (ref 135–145)

## 2014-02-21 LAB — CBC WITH DIFFERENTIAL/PLATELET
BASOS ABS: 0 10*3/uL (ref 0.0–0.1)
Basophils Relative: 0.3 % (ref 0.0–3.0)
EOS ABS: 0.5 10*3/uL (ref 0.0–0.7)
Eosinophils Relative: 8.6 % — ABNORMAL HIGH (ref 0.0–5.0)
HCT: 38 % — ABNORMAL LOW (ref 39.0–52.0)
Hemoglobin: 12.9 g/dL — ABNORMAL LOW (ref 13.0–17.0)
Lymphocytes Relative: 18.1 % (ref 12.0–46.0)
Lymphs Abs: 1 10*3/uL (ref 0.7–4.0)
MCHC: 33.8 g/dL (ref 30.0–36.0)
MCV: 92.1 fl (ref 78.0–100.0)
MONO ABS: 0.5 10*3/uL (ref 0.1–1.0)
Monocytes Relative: 8.9 % (ref 3.0–12.0)
NEUTROS PCT: 64.1 % (ref 43.0–77.0)
Neutro Abs: 3.4 10*3/uL (ref 1.4–7.7)
Platelets: 171 10*3/uL (ref 150.0–400.0)
RBC: 4.13 Mil/uL — ABNORMAL LOW (ref 4.22–5.81)
RDW: 13.3 % (ref 11.5–15.5)
WBC: 5.3 10*3/uL (ref 4.0–10.5)

## 2014-02-21 MED ORDER — AMIODARONE HCL 200 MG PO TABS: 200.0000 mg | ORAL_TABLET | Freq: Two times a day (BID) | ORAL | Status: DC

## 2014-02-21 NOTE — Patient Instructions (Addendum)
Your physician has recommended that you have a Cardioversion (DCCV). Electrical Cardioversion uses a jolt of electricity to your heart either through paddles or wired patches attached to your chest. This is a controlled, usually prescheduled, procedure. Defibrillation is done under light anesthesia in the hospital, and you usually go home the day of the procedure. This is done to get your heart back into a normal rhythm. You are not awake for the procedure. Please see the instruction sheet given to you today.  See instruction sheet  Your physician recommends that you schedule a follow-up appointment 3 months with Rudi Coco, NP  Your physician has recommended you make the following change in your medication:  1) Increase Amiodarone to 200mg  twice daily

## 2014-02-21 NOTE — Progress Notes (Signed)
PCP: Joseph Garrett is a 53 y.o. male who presents today for electrophysiology follow up with h/o afib ablation x 2, last procedure in 2013. He was seen in the office 7-1 and was doing well. He last his job last Thursday and the following Tuesday,woke up from a nap around 730p with rapid afib. Stood up, got dizzy and became syncopal and fell to floor. Wife was at home with him. LOC was brief. He went back to bed and when getting up later that day to go to bathroom around 10:30 p, had syncope again in bathroom. He hit his head and his knee, Again LOC was brief. He did not go to the hospital due to lack of insurance but is now on his wife's policy.  He tried to increase amiodarone and carvedilol x 3 days without any benefit. EKG today shows afib with rate control.  Continues on Xarelto with  CHADS2VASC score of 3 without missing any doses. . Echo in 2014 with EF of 25-30 % with Echo in March showing improvement to 45-50%. Continues on ace and bb.  On amiodarone 100 mg a day.    Today, he  complains of fatigue since going into afib. No further syncopal episodes. Mild shortness of breath, no  lower extremity edema, dizziness, presyncope, or syncope. Has lost 48 pounds through diet modification.  The patient is otherwise without complaint today.   Past Medical History  Diagnosis Date  . Paroxysmal atrial fibrillation   . TIA (transient ischemic attack)   . Hypercholesteremia   . Thrombocytopenia   . Hypertension   . Obesity   . Depression   . Gout   . Hives   . GERD (gastroesophageal reflux disease)   . Cirrhosis     liver scarring on biopsy, presumed to be due to methotrexate  . Psoriasis   . OSA (obstructive sleep apnea)     mild, uses CPAP  . CHF (congestive heart failure)   . DJD (degenerative joint disease)    Past Surgical History  Procedure Laterality Date  . Cholecystectomy  2011  . Nose surgery      Turbinates  . Reconstruction of nose    . Tee without cardioversion   03/16/2012    Procedure: TRANSESOPHAGEAL ECHOCARDIOGRAM (TEE);  Surgeon: [name], MD;  Location: [facility] ENDOSCOPY;  Service: Cardiovascular;  Laterality: N/A;  . Atrial fibrillation ablation  03/17/12    PVI and CTI ablation by Dr [name]  . Tendon repair      Right Forearm  . Tee without cardioversion N/A 09/17/2012    Procedure: TRANSESOPHAGEAL ECHOCARDIOGRAM (TEE);  Surgeon: Lewayne Bunting, MD;  Location: [facility] ENDOSCOPY;  Service: Cardiovascular;  Laterality: N/A;  . Ablation of dysrhythmic focus  09/17/2012  . Cardioversion N/A 10/09/2012    Procedure: CARDIOVERSION;  Surgeon: Laurey Morale, MD;  Location: [facility] ENDOSCOPY;  Service: Cardiovascular;  Laterality: N/A;    Current Outpatient Prescriptions  Medication Sig Dispense Refill  . amiodarone (PACERONE) 200 MG tablet Take 1 tablet (200 mg total) by mouth 2 (two) times daily.      . carvedilol (COREG) 12.5 MG tablet Take 6.25 mg by mouth 2 (two) times daily with a meal.       . Febuxostat (ULORIC) 80 MG TABS Take 80 mg by mouth daily.      Marland Kitchen lisinopril (PRINIVIL,ZESTRIL) 10 MG tablet Take 1 tablet (10 mg total) by mouth daily.  30 tablet  5  . rivaroxaban (XARELTO)  20 MG TABS tablet Take 20 mg by mouth daily.      . sertraline (ZOLOFT) 100 MG tablet Take 150 mg by mouth daily.      . simvastatin (ZOCOR) 20 MG tablet TAKE 1 TABLET BY MOUTH EVERY EVENING  90 tablet  0  . tamsulosin (FLOMAX) 0.4 MG CAPS capsule Take 0.4 mg by mouth daily.      Marland Kitchen tolterodine (DETROL LA) 4 MG 24 hr capsule Take 4 mg by mouth daily.      . traZODone (DESYREL) 100 MG tablet Take 100 mg by mouth at bedtime.      Marland Kitchen zolpidem (AMBIEN) 10 MG tablet Take 10 mg by mouth at bedtime.       No current facility-administered medications for this visit.    Physical Exam: Filed Vitals:   02/21/14 1159  BP: 112/74  Pulse: 97  Height:  (1.88 m)  Weight: 123.288 kg (271 lb 12.8 oz)    GEN- The patient is well appearing, alert and oriented x 3 today.   Head-  normocephalic, atraumatic Eyes-  Sclera clear, conjunctiva pink Ears- hearing intact Oropharynx- clear Lungs- Clear to ausculation bilaterally, normal work of breathing Heart- Irregular rate and rhythm, no murmurs, rubs or gallops, PMI not laterally displaced GI- soft, NT, ND, + BS Extremities- no clubbing, cyanosis, or edema  ekg today reveals  afib at 97 bpm.  Assessment and Plan:  1.  Persistent afib with syncope, sounds postural. Possibly contributed to by loss of job. Increase amiodarone to 200 mg bid. Plan on cardioversion. He is appropriately anticoagulated with xarelto and has not missed any doses.  2. HTN Stable No change required today. Is losing weight, may need   adjustment of med if  bp trends down.  3. OSA Compliance with CPAP advised  4. Nonischemic CM stable Repeat echo  In 6 months on f/u.   Return in 3 months.  I have seen, examined the patient, and reviewed the above assessment and plan.  Changes to above are made where necessary.  Pt with recurrent afib in the setting of stress related to recent job loss.  He has had several recent episodes of syncope which were clearly postural in nature.He has lost over 40 lbs and could have dehydration as the cause for these episodes.  Obtain cbc and bmet.  No driving until he returns to our office (pt aware)  Co Sign: Hillis Range, MD 02/21/2014 5:38 PM

## 2014-02-22 ENCOUNTER — Ambulatory Visit (HOSPITAL_COMMUNITY): Payer: PRIVATE HEALTH INSURANCE | Admitting: Certified Registered Nurse Anesthetist

## 2014-02-22 ENCOUNTER — Encounter (HOSPITAL_COMMUNITY): Payer: Self-pay

## 2014-02-22 ENCOUNTER — Ambulatory Visit (HOSPITAL_COMMUNITY)
Admission: RE | Admit: 2014-02-22 | Discharge: 2014-02-22 | Disposition: A | Discharge status: Home / Self Care | Payer: PRIVATE HEALTH INSURANCE | Source: Ambulatory Visit | Attending: Cardiology | Admitting: Cardiology

## 2014-02-22 ENCOUNTER — Encounter (HOSPITAL_COMMUNITY): Payer: PRIVATE HEALTH INSURANCE | Admitting: Certified Registered Nurse Anesthetist

## 2014-02-22 ENCOUNTER — Encounter (HOSPITAL_COMMUNITY)
Admission: RE | Disposition: A | Discharge status: Home / Self Care | Payer: Self-pay | Source: Ambulatory Visit | Attending: Cardiology

## 2014-02-22 DIAGNOSIS — F3289 Other specified depressive episodes: Secondary | ICD-10-CM | POA: Diagnosis not present

## 2014-02-22 DIAGNOSIS — Z79899 Other long term (current) drug therapy: Secondary | ICD-10-CM | POA: Diagnosis not present

## 2014-02-22 DIAGNOSIS — I4891 Unspecified atrial fibrillation: Secondary | ICD-10-CM | POA: Diagnosis present

## 2014-02-22 DIAGNOSIS — G4733 Obstructive sleep apnea (adult) (pediatric): Secondary | ICD-10-CM | POA: Diagnosis not present

## 2014-02-22 DIAGNOSIS — K7460 Unspecified cirrhosis of liver: Secondary | ICD-10-CM | POA: Insufficient documentation

## 2014-02-22 DIAGNOSIS — D696 Thrombocytopenia, unspecified: Secondary | ICD-10-CM | POA: Insufficient documentation

## 2014-02-22 DIAGNOSIS — F329 Major depressive disorder, single episode, unspecified: Secondary | ICD-10-CM | POA: Insufficient documentation

## 2014-02-22 DIAGNOSIS — F411 Generalized anxiety disorder: Secondary | ICD-10-CM | POA: Diagnosis not present

## 2014-02-22 DIAGNOSIS — I428 Other cardiomyopathies: Secondary | ICD-10-CM | POA: Diagnosis not present

## 2014-02-22 DIAGNOSIS — I10 Essential (primary) hypertension: Secondary | ICD-10-CM | POA: Diagnosis not present

## 2014-02-22 DIAGNOSIS — I509 Heart failure, unspecified: Secondary | ICD-10-CM | POA: Insufficient documentation

## 2014-02-22 DIAGNOSIS — I480 Paroxysmal atrial fibrillation: Secondary | ICD-10-CM

## 2014-02-22 DIAGNOSIS — M109 Gout, unspecified: Secondary | ICD-10-CM | POA: Insufficient documentation

## 2014-02-22 DIAGNOSIS — E669 Obesity, unspecified: Secondary | ICD-10-CM | POA: Insufficient documentation

## 2014-02-22 DIAGNOSIS — R55 Syncope and collapse: Secondary | ICD-10-CM | POA: Diagnosis not present

## 2014-02-22 DIAGNOSIS — K219 Gastro-esophageal reflux disease without esophagitis: Secondary | ICD-10-CM | POA: Diagnosis not present

## 2014-02-22 HISTORY — PX: CARDIOVERSION: SHX1299

## 2014-02-22 SURGERY — CARDIOVERSION
Anesthesia: Monitor Anesthesia Care

## 2014-02-22 MED ORDER — SODIUM CHLORIDE 0.9 % IV SOLN
INTRAVENOUS | Status: DC
  Administered 2014-02-22: 08:00:00 via INTRAVENOUS

## 2014-02-22 MED ORDER — LIDOCAINE HCL (CARDIAC) 20 MG/ML IV SOLN
INTRAVENOUS | Status: DC | PRN
  Administered 2014-02-22: 40 mg via INTRAVENOUS

## 2014-02-22 MED ORDER — PROPOFOL 10 MG/ML IV BOLUS
INTRAVENOUS | Status: DC | PRN
  Administered 2014-02-22: 80 mg via INTRAVENOUS

## 2014-02-22 NOTE — Anesthesia Postprocedure Evaluation (Signed)
  Anesthesia Post-op Note  Patient: Joseph Garrett  Procedure(s) Performed: Procedure(s): CARDIOVERSION (N/A)  Patient Location: Endoscopy Unit  Anesthesia Type:General  Level of Consciousness: awake, alert , oriented and patient cooperative  Airway and Oxygen Therapy: Patient Spontanous Breathing  Post-op Pain: none  Post-op Assessment: Post-op Vital signs reviewed, Patient's Cardiovascular Status Stable, Respiratory Function Stable, Patent Airway and No signs of Nausea or vomiting  Post-op Vital Signs: Reviewed and stable  Last Vitals:  Filed Vitals:   02/22/14 0811  BP: 100/79  Temp: 36.6 C  Resp: 17    Complications: No apparent anesthesia complications

## 2014-02-22 NOTE — Transfer of Care (Signed)
Immediate Anesthesia Transfer of Care Note  Patient: Joseph Garrett  Procedure(s) Performed: Procedure(s): CARDIOVERSION (N/A)  Patient Location: Endoscopy Unit  Anesthesia Type:General  Level of Consciousness: awake, alert , oriented and patient cooperative  Airway & Oxygen Therapy: Patient Spontanous Breathing and Patient connected to nasal cannula oxygen  Post-op Assessment: Report given to PACU RN, Post -op Vital signs reviewed and stable and Patient moving all extremities  Post vital signs: Reviewed and stable  Complications: No apparent anesthesia complications

## 2014-02-22 NOTE — CV Procedure (Signed)
   Electrical Cardioversion Procedure Note   KELVON DENKINS [id number] 01/13/1961  Procedure: Electrical Cardioversion Indications:  Atrial Fibrillation  Time Out: Verified patient identification, verified procedure,medications/allergies/relevent history reviewed, required imaging and test results available.  Performed  Procedure Details  The patient was NPO after midnight. Anesthesia was administered at the beside  by Dr. Katrinka Blazing with 40mg  of Lidocaine and 70mg  of propofol.  Cardioversion was done with synchronized biphasic defibrillation with AP pads with 150watts.  The patient did not convert to normal sinus rhythm.  Cardioversion was done with synchronized biphasic defibrillation with AP pads with 200watts. The patient did not convert to normal sinus rhythm.  Cardioversion was done with synchronized biphasic defibrillation with AP pads with 200watts again. The patient converted to NSR.  The patient tolerated the procedure well   IMPRESSION:  Successful cardioversion of atrial fibrillation    Zachry Hopfensperger R 02/22/2014, 9:04 AM

## 2014-02-22 NOTE — Discharge Instructions (Signed)

## 2014-02-22 NOTE — Anesthesia Procedure Notes (Signed)
Procedure Name: MAC Date/Time: 02/22/2014 9:06 AM Performed by: Jerilee Hoh Pre-anesthesia Checklist: Patient identified, Emergency Drugs available, Suction available and Patient being monitored Patient Re-evaluated:Patient Re-evaluated prior to inductionOxygen Delivery Method: Ambu bag Preoxygenation: Pre-oxygenation with 100% oxygen Intubation Type: IV induction Ventilation: Mask ventilation without difficulty

## 2014-02-22 NOTE — Interval H&P Note (Signed)
History and Physical Interval Note:  02/22/2014 9:03 AM  Joseph Garrett  has presented today for surgery, with the diagnosis of afib  The various methods of treatment have been discussed with the patient and family. After consideration of risks, benefits and other options for treatment, the patient has consented to  Procedure(s): CARDIOVERSION (N/A) as a surgical intervention .  The patient's history has been reviewed, patient examined, no change in status, stable for surgery.  I have reviewed the patient's chart and labs.  Questions were answered to the patient's satisfaction.     TURNER,TRACI R

## 2014-02-22 NOTE — H&P (View-Only) (Signed)
PCP: Joseph Garrett is a 53 y.o. male who presents today for electrophysiology follow up with h/o afib ablation x 2, last procedure in 2013. He was seen in the office 7-1 and was doing well. He last his job last Thursday and the following Tuesday,woke up from a nap around 730p with rapid afib. Stood up, got dizzy and became syncopal and fell to floor. Wife was at home with him. LOC was brief. He went back to bed and when getting up later that day to go to bathroom around 10:30 p, had syncope again in bathroom. He hit his head and his knee, Again LOC was brief. He did not go to the hospital due to lack of insurance but is now on his wife's policy.  He tried to increase amiodarone and carvedilol x 3 days without any benefit. EKG today shows afib with rate control.  Continues on Xarelto with  CHADS2VASC score of 3 without missing any doses. . Echo in 2014 with EF of 25-30 % with Echo in March showing improvement to 45-50%. Continues on ace and bb.  On amiodarone 100 mg a day.    Today, he  complains of fatigue since going into afib. No further syncopal episodes. Mild shortness of breath, no  lower extremity edema, dizziness, presyncope, or syncope. Has lost 48 pounds through diet modification.  The patient is otherwise without complaint today.   Past Medical History  Diagnosis Date  . Paroxysmal atrial fibrillation   . TIA (transient ischemic attack)   . Hypercholesteremia   . Thrombocytopenia   . Hypertension   . Obesity   . Depression   . Gout   . Hives   . GERD (gastroesophageal reflux disease)   . Cirrhosis     liver scarring on biopsy, presumed to be due to methotrexate  . Psoriasis   . OSA (obstructive sleep apnea)     mild, uses CPAP  . CHF (congestive heart failure)   . DJD (degenerative joint disease)    Past Surgical History  Procedure Laterality Date  . Cholecystectomy  2011  . Nose surgery      Turbinates  . Reconstruction of nose    . Tee without cardioversion   03/16/2012    Procedure: TRANSESOPHAGEAL ECHOCARDIOGRAM (TEE);  Surgeon: [name], MD;  Location: [facility] ENDOSCOPY;  Service: Cardiovascular;  Laterality: N/A;  . Atrial fibrillation ablation  03/17/12    PVI and CTI ablation by Dr [name]  . Tendon repair      Right Forearm  . Tee without cardioversion N/A 09/17/2012    Procedure: TRANSESOPHAGEAL ECHOCARDIOGRAM (TEE);  Surgeon: Lewayne Bunting, MD;  Location: [facility] ENDOSCOPY;  Service: Cardiovascular;  Laterality: N/A;  . Ablation of dysrhythmic focus  09/17/2012  . Cardioversion N/A 10/09/2012    Procedure: CARDIOVERSION;  Surgeon: Laurey Morale, MD;  Location: [facility] ENDOSCOPY;  Service: Cardiovascular;  Laterality: N/A;    Current Outpatient Prescriptions  Medication Sig Dispense Refill  . amiodarone (PACERONE) 200 MG tablet Take 1 tablet (200 mg total) by mouth 2 (two) times daily.      . carvedilol (COREG) 12.5 MG tablet Take 6.25 mg by mouth 2 (two) times daily with a meal.       . Febuxostat (ULORIC) 80 MG TABS Take 80 mg by mouth daily.      Marland Kitchen lisinopril (PRINIVIL,ZESTRIL) 10 MG tablet Take 1 tablet (10 mg total) by mouth daily.  30 tablet  5  . rivaroxaban (XARELTO)  20 MG TABS tablet Take 20 mg by mouth daily.      . sertraline (ZOLOFT) 100 MG tablet Take 150 mg by mouth daily.      . simvastatin (ZOCOR) 20 MG tablet TAKE 1 TABLET BY MOUTH EVERY EVENING  90 tablet  0  . tamsulosin (FLOMAX) 0.4 MG CAPS capsule Take 0.4 mg by mouth daily.      . tolterodine (DETROL LA) 4 MG 24 hr capsule Take 4 mg by mouth daily.      . traZODone (DESYREL) 100 MG tablet Take 100 mg by mouth at bedtime.      . zolpidem (AMBIEN) 10 MG tablet Take 10 mg by mouth at bedtime.       No current facility-administered medications for this visit.    Physical Exam: Filed Vitals:   02/21/14 1159  BP: 112/74  Pulse: 97  Height: 6' 2" (1.88 m)  Weight: 123.288 kg (271 lb 12.8 oz)    GEN- The patient is well appearing, alert and oriented x 3 today.   Head-  normocephalic, atraumatic Eyes-  Sclera clear, conjunctiva pink Ears- hearing intact Oropharynx- clear Lungs- Clear to ausculation bilaterally, normal work of breathing Heart- Irregular rate and rhythm, no murmurs, rubs or gallops, PMI not laterally displaced GI- soft, NT, ND, + BS Extremities- no clubbing, cyanosis, or edema  ekg today reveals  afib at 97 bpm.  Assessment and Plan:  1.  Persistent afib with syncope, sounds postural. Possibly contributed to by loss of job. Increase amiodarone to 200 mg bid. Plan on cardioversion. He is appropriately anticoagulated with xarelto and has not missed any doses.  2. HTN Stable No change required today. Is losing weight, may need   adjustment of med if  bp trends down.  3. OSA Compliance with CPAP advised  4. Nonischemic CM stable Repeat echo  In 6 months on f/u.   Return in 3 months.  I have seen, examined the patient, and reviewed the above assessment and plan.  Changes to above are made where necessary.  Pt with recurrent afib in the setting of stress related to recent job loss.  He has had several recent episodes of syncope which were clearly postural in nature.He has lost over 40 lbs and could have dehydration as the cause for these episodes.  Obtain cbc and bmet.  No driving until he returns to our office (pt aware)  Co Sign: Terrika Zuver, MD 02/21/2014 5:38 PM   

## 2014-02-22 NOTE — Anesthesia Preprocedure Evaluation (Signed)
Anesthesia Evaluation  Patient identified by MRN, date of birth, ID band Patient awake    Reviewed: Allergy & Precautions, H&P , NPO status , Patient's Chart, lab work & pertinent test results  Airway       Dental   Pulmonary sleep apnea ,          Cardiovascular hypertension, +CHF + dysrhythmias Atrial Fibrillation     Neuro/Psych Anxiety Depression TIA   GI/Hepatic GERD-  ,  Endo/Other    Renal/GU      Musculoskeletal   Abdominal   Peds  Hematology   Anesthesia Other Findings   Reproductive/Obstetrics                           Anesthesia Physical Anesthesia Plan  ASA: III  Anesthesia Plan: General   Post-op Pain Management:    Induction: Intravenous  Airway Management Planned: Mask  Additional Equipment:   Intra-op Plan:   Post-operative Plan:   Informed Consent: I have reviewed the patients History and Physical, chart, labs and discussed the procedure including the risks, benefits and alternatives for the proposed anesthesia with the patient or authorized representative who has indicated his/her understanding and acceptance.     Plan Discussed with: CRNA, Anesthesiologist and Surgeon  Anesthesia Plan Comments:         Anesthesia Quick Evaluation

## 2014-02-23 ENCOUNTER — Encounter (HOSPITAL_COMMUNITY): Payer: Self-pay | Admitting: Cardiology

## 2014-03-10 ENCOUNTER — Ambulatory Visit (INDEPENDENT_AMBULATORY_CARE_PROVIDER_SITE_OTHER): Payer: PRIVATE HEALTH INSURANCE | Admitting: Physician Assistant

## 2014-03-10 ENCOUNTER — Encounter: Payer: Self-pay | Admitting: Physician Assistant

## 2014-03-10 VITALS — BP 130/80 | HR 49 | Ht 74.0 in | Wt 275.0 lb

## 2014-03-10 DIAGNOSIS — R001 Bradycardia, unspecified: Secondary | ICD-10-CM

## 2014-03-10 DIAGNOSIS — I4819 Other persistent atrial fibrillation: Secondary | ICD-10-CM

## 2014-03-10 DIAGNOSIS — I498 Other specified cardiac arrhythmias: Secondary | ICD-10-CM

## 2014-03-10 DIAGNOSIS — G4733 Obstructive sleep apnea (adult) (pediatric): Secondary | ICD-10-CM

## 2014-03-10 DIAGNOSIS — R55 Syncope and collapse: Secondary | ICD-10-CM

## 2014-03-10 DIAGNOSIS — I4891 Unspecified atrial fibrillation: Secondary | ICD-10-CM

## 2014-03-10 DIAGNOSIS — I10 Essential (primary) hypertension: Secondary | ICD-10-CM

## 2014-03-10 MED ORDER — CARVEDILOL 3.125 MG PO TABS: 3.1250 mg | ORAL_TABLET | Freq: Two times a day (BID) | ORAL | Status: DC

## 2014-03-10 MED ORDER — AMIODARONE HCL 100 MG PO TABS: 100.0000 mg | ORAL_TABLET | Freq: Every day | ORAL | Status: DC

## 2014-03-10 MED ORDER — LISINOPRIL 10 MG PO TABS: 10.0000 mg | ORAL_TABLET | Freq: Two times a day (BID) | ORAL | Status: DC

## 2014-03-10 NOTE — Progress Notes (Signed)
Cardiology Office Note    Date:  03/10/2014   ID:  Joseph, Garrett 02/25/1961, MRN [id number]  PCP:  Verl Bangs, MD  Cardiologist:  Dr. Hillis Range      History of Present Illness: Joseph Garrett is a 53 y.o. male with a hx of PAF s/p ablation x 2, prior TIA, HTN, OSA, cardiomyopathy with prior EF 25-30% >> improved to 45-50% by most recent echo.  He was seen recently in the AFib clinic by D. Noralyn Pick, NP and Dr. Hillis Range for recurrent AFib and syncope.  Amiodarone was increased to 200 bid.  Patient admitted to adherence to Xarelto.  He was set up for DCCV on 8/25 with restoration of NSR.  Of note, syncope was felt to be postural but driving was restricted until FU with Dr. Hillis Range.  The patient has had a few palpitations since his DCCV but no symptoms of recurrent AFib.  He notes occasional dizziness. Denies recurrent syncope.  He has occ sharp chest pain.  Denies exertional symptoms.  This is a chronic symptom without significant change.  He notes dyspnea with more extreme activities.  He is NYHA 2-2b.  Denies orthopnea, PND, edema.  He has started to have L sided headaches with assoc photophobia.  He has a hx of migraines.  HAs are worse when his BP is > 140 systolic.  He has recently increased Lisinopril on his b/c of higher BPs.     Studies:  - Echo (3/15):  EF 45-50%, no RWMA, mild LAE  - Nuclear (3/14):  Mild to mod apical atten artifact vs scar  - Carotid US (5/13):  No sig ICA stenosis   Recent Labs/Images: 12/29/2013: ALT 23; TSH 0.93  02/21/2014: Creatinine 1.1; Hemoglobin 12.9*; Potassium 4.2   No results found.   Wt Readings from Last 3 Encounters:  02/21/14 271 lb 12.8 oz (123.288 kg)  12/29/13 233 lb (105.688 kg)  08/27/13 297 lb (134.718 kg)     Past Medical History  Diagnosis Date  . Paroxysmal atrial fibrillation   . TIA (transient ischemic attack)   . Hypercholesteremia   . Thrombocytopenia   . Hypertension   . Obesity   .  Depression   . Gout   . Hives   . GERD (gastroesophageal reflux disease)   . Cirrhosis     liver scarring on biopsy, presumed to be due to methotrexate  . Psoriasis   . OSA (obstructive sleep apnea)     mild, uses CPAP  . CHF (congestive heart failure)   . DJD (degenerative joint disease)     Current Outpatient Prescriptions  Medication Sig Dispense Refill  . amiodarone (PACERONE) 200 MG tablet Take 100 mg by mouth 2 (two) times daily.      . Calcium-Vitamin D-Vitamin K (CALCIUM SOFT CHEWS PO) Take 1 tablet by mouth daily.      . carvedilol (COREG) 12.5 MG tablet Take 6.25 mg by mouth 2 (two) times daily with a meal.       . Febuxostat (ULORIC) 80 MG TABS Take 80 mg by mouth daily.      Marland Kitchen HYDROcodone-acetaminophen (NORCO) 7.5-325 MG per tablet Take 1 tablet by mouth every 6 (six) hours as needed for moderate pain.      Marland Kitchen ibuprofen (ADVIL,MOTRIN) 200 MG tablet Take 800 mg by mouth every 6 (six) hours as needed (pain).      Marland Kitchen lisinopril (PRINIVIL,ZESTRIL) 10 MG tablet Take 1 tablet (10 mg total) by mouth  daily.  30 tablet  5  . piroxicam (FELDENE) 20 MG capsule Take 20 mg by mouth daily.      . rivaroxaban (XARELTO) 20 MG TABS tablet Take 20 mg by mouth daily.      . sertraline (ZOLOFT) 100 MG tablet Take 150 mg by mouth daily.      . simvastatin (ZOCOR) 20 MG tablet Take 20 mg by mouth daily.      . tamsulosin (FLOMAX) 0.4 MG CAPS capsule Take 0.4 mg by mouth 2 (two) times daily.       Marland Kitchen tolterodine (DETROL LA) 4 MG 24 hr capsule Take 4 mg by mouth daily.      . traZODone (DESYREL) 100 MG tablet Take 100 mg by mouth at bedtime.      Marland Kitchen zolpidem (AMBIEN) 10 MG tablet Take 10 mg by mouth at bedtime.       No current facility-administered medications for this visit.     Allergies:   Penicillins   Social History:  The patient  reports that he has never smoked. He has never used smokeless tobacco. He reports that he does not drink alcohol or use illicit drugs.   Family History:  The  patient's family history includes Diabetes in an other family member; Heart disease in an other family member.   ROS:  Please see the history of present illness.      All other systems reviewed and negative.   PHYSICAL EXAM: VS:  BP 130/80  Pulse 49  Ht  (1.88 m)  Wt 275 lb (124.739 kg)  BMI 35.29 kg/m2 Well nourished, well developed, in no acute distress HEENT: normal Neck: no JVD Cardiac:  normal S1, S2; RRR; no murmur Lungs:  clear to auscultation bilaterally, no wheezing, rhonchi or rales Abd: soft, nontender, no hepatomegaly Ext: trace bilateral LE edema Skin: warm and dry Neuro:  CNs 2-12 intact, no focal abnormalities noted  EKG:  Sinus brady, HR 49, no ST changes.       ASSESSMENT AND PLAN:  1. Persistent atrial fibrillation:  Maintaining NSR.  He reduced his Amiodarone to 100 mg on his own.  His HR is slow and he may be symptomatic.  Continue current dose of Amiodarone.  Continue Xarelto.   2. Bradycardia:  As noted, he may be symptomatic.  I will decrease his Coreg to 3.125 mg bid.  We discussed placing an event monitor.  However, he tells me that Dr. Hillis Range mentioned an ILR recently.  I will review with Dr. Hillis Range.  3. Syncope, unspecified syncope type:  No recurrence.  I reminded him that he should not drive until he sees Dr. Hillis Range back.  4. Unspecified essential hypertension:  He has had some recent migraines related to his BP.  Increase Lisinopril to 10 mg bid.  We may need to increase this more with reducing his Coreg.  Check BMET in 1 week.  5. OSA (obstructive sleep apnea):  Continue CPAP.   Disposition:  FU with Dr. Hillis Range as planned.    Signed, Brynda Rim, MHS 03/10/2014 10:10 AM    [name] [address] [place], [place], [place]  [address] Phone: [phone]; Fax: [phone]

## 2014-03-10 NOTE — Patient Instructions (Addendum)
Your physician has recommended you make the following change in your medication:  1. DECREASE COREG TO 3.125 MG 1 TABLET TWICE DAILY; NEW RX SENT IN  2. INCREASE LISINOPRIL 10 MG 1 TABLET TWICE DAILY; NEW RX SENT IN TODAY  LAB WORK TODAY; BMET  KEEP YOUR FOLLOW UP WITH DR. ALLRED IN 05/2014   Low-Sodium Eating Plan Sodium raises blood pressure and causes water to be held in the body. Getting less sodium from food will help lower your blood pressure, reduce any swelling, and protect your heart, liver, and kidneys. We get sodium by adding salt (sodium chloride) to food. Most of our sodium comes from canned, boxed, and frozen foods. Restaurant foods, fast foods, and pizza are also very high in sodium. Even if you take medicine to lower your blood pressure or to reduce fluid in your body, getting less sodium from your food is important. WHAT IS MY PLAN? Most people should limit their sodium intake to 2,300 mg a day. Your health care provider recommends that you limit your sodium intake to 2000 to 4000 mg a day.  WHAT DO I NEED TO KNOW ABOUT THIS EATING PLAN? For the low-sodium eating plan, you will follow these general guidelines:  Choose foods with a % Daily Value for sodium of less than 5% (as listed on the food label).   Use salt-free seasonings or herbs instead of table salt or sea salt.   Check with your health care provider or pharmacist before using salt substitutes.   Eat fresh foods.  Eat more vegetables and fruits.  Limit canned vegetables. If you do use them, rinse them well to decrease the sodium.   Limit cheese to 1 oz (28 g) per day.   Eat lower-sodium products, often labeled as "lower sodium" or "no salt added."  Avoid foods that contain monosodium glutamate (MSG). MSG is sometimes added to [place] food and some canned foods.  Check food labels (Nutrition Facts labels) on foods to learn how much sodium is in one serving.  Eat more home-cooked food and less  restaurant, buffet, and fast food.  When eating at a restaurant, ask that your food be prepared with less salt or none, if possible.  HOW DO I READ FOOD LABELS FOR SODIUM INFORMATION? The Nutrition Facts label lists the amount of sodium in one serving of the food. If you eat more than one serving, you must multiply the listed amount of sodium by the number of servings. Food labels may also identify foods as:  Sodium free--Less than 5 mg in a serving.  Very low sodium--35 mg or less in a serving.  Low sodium--140 mg or less in a serving.  Light in sodium--50% less sodium in a serving. For example, if a food that usually has 300 mg of sodium is changed to become light in sodium, it will have 150 mg of sodium.  Reduced sodium--25% less sodium in a serving. For example, if a food that usually has 400 mg of sodium is changed to reduced sodium, it will have 300 mg of sodium. WHAT FOODS CAN I EAT? Grains Low-sodium cereals, including oats, puffed wheat and rice, and shredded wheat cereals. Low-sodium crackers. Unsalted rice and pasta. Lower-sodium bread.  Vegetables Frozen or fresh vegetables. Low-sodium or reduced-sodium canned vegetables. Low-sodium or reduced-sodium tomato sauce and paste. Low-sodium or reduced-sodium tomato and vegetable juices.  Fruits Fresh, frozen, and canned fruit. Fruit juice.  Meat and Other Protein Products Low-sodium canned tuna and salmon. Fresh or frozen  meat, poultry, seafood, and fish. Lamb. Unsalted nuts. Dried beans, peas, and lentils without added salt. Unsalted canned beans. Homemade soups without salt. Eggs.  Dairy Milk. Soy milk. Ricotta cheese. Low-sodium or reduced-sodium cheeses. Yogurt.  Condiments Fresh and dried herbs and spices. Salt-free seasonings. Onion and garlic powders. Low-sodium varieties of mustard and ketchup. Lemon juice.  Fats and Oils Reduced-sodium salad dressings. Unsalted butter.  Other Unsalted popcorn and pretzels.    The items listed above may not be a complete list of recommended foods or beverages. Contact your dietitian for more options. WHAT FOODS ARE NOT RECOMMENDED? Grains Instant hot cereals. Bread stuffing, pancake, and biscuit mixes. Croutons. Seasoned rice or pasta mixes. Noodle soup cups. Boxed or frozen macaroni and cheese. Self-rising flour. Regular salted crackers. Vegetables Regular canned vegetables. Regular canned tomato sauce and paste. Regular tomato and vegetable juices. Frozen vegetables in sauces. Salted french fries. Olives. Angie Fava. Relishes. Sauerkraut. Salsa. Meat and Other Protein Products Salted, canned, smoked, spiced, or pickled meats, seafood, or fish. Bacon, ham, sausage, hot dogs, corned beef, chipped beef, and packaged luncheon meats. Salt pork. Jerky. Pickled herring. Anchovies, regular canned tuna, and sardines. Salted nuts. Dairy Processed cheese and cheese spreads. Cheese curds. Blue cheese and cottage cheese. Buttermilk.  Condiments Onion and garlic salt, seasoned salt, table salt, and sea salt. Canned and packaged gravies. Worcestershire sauce. Tartar sauce. Barbecue sauce. Teriyaki sauce. Soy sauce, including reduced sodium. Steak sauce. Fish sauce. Oyster sauce. Cocktail sauce. Horseradish. Regular ketchup and mustard. Meat flavorings and tenderizers. Bouillon cubes. Hot sauce. Tabasco sauce. Marinades. Taco seasonings. Relishes. Fats and Oils Regular salad dressings. Salted butter. Margarine. Ghee. Bacon fat.  Other Potato and tortilla chips. Corn chips and puffs. Salted popcorn and pretzels. Canned or dried soups. Pizza. Frozen entrees and pot pies.  The items listed above may not be a complete list of foods and beverages to avoid. Contact your dietitian for more information. Document Released: 12/07/2001 Document Revised: 06/22/2013 Document Reviewed: 04/21/2013 [facility] Patient Information 2015 [place], [place]. This information is not intended to replace  advice given to you by your health care provider. Make sure you discuss any questions you have with your health care provider.

## 2014-03-14 ENCOUNTER — Telehealth: Payer: Self-pay | Admitting: Physician Assistant

## 2014-03-14 NOTE — Telephone Encounter (Signed)
Please tell [name] that I reviewed his case with Dr. Hillis Range.  He felt that it would be safest for Mr. Rosene to wait 6 weeks after his last syncopal episode before resuming driving.   Tereso Newcomer, PA-C   03/14/2014 1:22 PM

## 2014-03-15 ENCOUNTER — Telehealth: Payer: Self-pay | Admitting: *Deleted

## 2014-03-15 NOTE — Telephone Encounter (Signed)
s/w pt that [employer]. PA and Dr. [name] agree it is safest for pt not drive for 6 weeks from the time of his last syncopal episode. Pt said he cannot do that and that he will drive. Pt said he is not a danger. I explained if driving and passes out could cause an accident and injure himself or somebody else. Pt then said if that is the case then that can happen to anybody who is driving. I said I agree however; if that happens and they are our pt we will advise they do not drive as well for a period of time per the advice of the doctor. I explained to the pt that it is our job to make sure they are ok. Pt said well he was going to drive.

## 2014-03-15 NOTE — Telephone Encounter (Signed)
s/w pt that Scott W. PA and Dr. Allred agree it is safest for pt not drive for 6 weeks from the time of his last syncopal episode. Pt said he cannot do that and that he will drive. Pt said he is not a danger. I explained if driving and passes out could cause an accident and injure himself or somebody else. Pt then said if that is the case then that can happen to anybody who is driving. I said I agree however; if that happens and they are our pt we will advise they do not drive as well for a period of time per the advice of the doctor. I explained to the pt that it is our job to make sure they are ok. Pt said well he was going to drive.  

## 2014-03-17 ENCOUNTER — Other Ambulatory Visit (INDEPENDENT_AMBULATORY_CARE_PROVIDER_SITE_OTHER): Payer: PRIVATE HEALTH INSURANCE

## 2014-03-17 ENCOUNTER — Telehealth: Payer: Self-pay

## 2014-03-17 ENCOUNTER — Telehealth: Payer: Self-pay | Admitting: Internal Medicine

## 2014-03-17 DIAGNOSIS — I4819 Other persistent atrial fibrillation: Secondary | ICD-10-CM

## 2014-03-17 DIAGNOSIS — I10 Essential (primary) hypertension: Secondary | ICD-10-CM

## 2014-03-17 DIAGNOSIS — R001 Bradycardia, unspecified: Secondary | ICD-10-CM

## 2014-03-17 DIAGNOSIS — I498 Other specified cardiac arrhythmias: Secondary | ICD-10-CM

## 2014-03-17 LAB — BASIC METABOLIC PANEL
BUN: 18 mg/dL (ref 6–23)
CHLORIDE: 109 meq/L (ref 96–112)
CO2: 26 mEq/L (ref 19–32)
Calcium: 9.1 mg/dL (ref 8.4–10.5)
Creatinine, Ser: 1.3 mg/dL (ref 0.4–1.5)
GFR: 62.59 mL/min (ref >60.00)
GLUCOSE: 81 mg/dL (ref 70–99)
POTASSIUM: 4.3 meq/L (ref 3.5–5.1)
SODIUM: 142 meq/L (ref 135–145)

## 2014-03-17 MED ORDER — LISINOPRIL 20 MG PO TABS
20.0000 mg | ORAL_TABLET | Freq: Two times a day (BID) | ORAL | Status: DC
Start: 2014-03-17 — End: 2014-04-13

## 2014-03-17 MED ORDER — AMIODARONE HCL 200 MG PO TABS: 200.0000 mg | ORAL_TABLET | Freq: Every day | ORAL | Status: DC

## 2014-03-17 NOTE — Telephone Encounter (Signed)
Patient is here this morning for lab work and would like to speak with you regarding his BP and his Afib. He's been in afib for a couple of days and his BP has been running very high.  He wants to know if he should have his BP taken this morning.

## 2014-03-17 NOTE — Telephone Encounter (Signed)
Late Entry: The pt was in the office today for BMP and he wanted to make Tereso Newcomer PA-C aware that his BP has been elevated since medication change last week.  The pt has also had 2 episodes of AFib since last week. The pt's BP was running 190/80 with initial medication changes. BP last night was 147/84. The pt also said that his headaches are improving.   Discussed with Scott PA-C and orders given to increase Amiodarone to 200mg  daily, Increase Lisinopril to 20mg  twice a day and repeat BMP in 2 WEEKS.   Pt aware of instructions and he would like to finish using his current supply of medication before getting a new Rx.  BMP will be repeated on 04/01/14.

## 2014-03-18 ENCOUNTER — Telehealth: Payer: Self-pay | Admitting: *Deleted

## 2014-03-18 ENCOUNTER — Other Ambulatory Visit: Payer: PRIVATE HEALTH INSURANCE

## 2014-03-18 NOTE — Telephone Encounter (Signed)
pt notified about lab results with verbal understanding  

## 2014-03-18 NOTE — Telephone Encounter (Signed)
Sharyn Blitz, RN at 03/17/2014 5:03 PM     Status: Signed        Late Entry:  The pt was in the office today for BMP and he wanted to make Tereso Newcomer PA-C aware that his BP has been elevated since medication change last week. The pt has also had 2 episodes of AFib since last week. The pt's BP was running 190/80 with initial medication changes. BP last night was 147/84. The pt also said that his headaches are improving.  Discussed with Scott PA-C and orders given to increase Amiodarone to 200mg  daily, Increase Lisinopril to 20mg  twice a day and repeat BMP in 2 WEEKS.  Pt aware of instructions and he would like to finish using his current supply of medication before getting a new Rx. BMP will be repeated on 04/01/14.

## 2014-04-01 ENCOUNTER — Other Ambulatory Visit: Payer: PRIVATE HEALTH INSURANCE

## 2014-04-05 ENCOUNTER — Telehealth: Payer: Self-pay | Admitting: *Deleted

## 2014-04-05 ENCOUNTER — Other Ambulatory Visit: Payer: PRIVATE HEALTH INSURANCE

## 2014-04-05 NOTE — Telephone Encounter (Signed)
Spoke with patient to follow up from [facility] visit on 03-10-14.    He would like to pursue loop recorder implantation.  Appt scheduled with Dr [name] for 04-13-14 at [facility].  He is interested in RIO-II.    Gypsy Balsam, RN, BSN 04/05/2014 4:00 PM

## 2014-04-06 ENCOUNTER — Other Ambulatory Visit: Payer: Self-pay

## 2014-04-06 MED ORDER — SIMVASTATIN 20 MG PO TABS: 20.0000 mg | ORAL_TABLET | Freq: Every day | ORAL | Status: DC

## 2014-04-13 ENCOUNTER — Encounter: Payer: Self-pay | Admitting: Internal Medicine

## 2014-04-13 ENCOUNTER — Ambulatory Visit (INDEPENDENT_AMBULATORY_CARE_PROVIDER_SITE_OTHER): Payer: PRIVATE HEALTH INSURANCE | Admitting: Internal Medicine

## 2014-04-13 ENCOUNTER — Encounter (HOSPITAL_COMMUNITY): Payer: Self-pay | Admitting: Pharmacy Technician

## 2014-04-13 VITALS — BP 124/82 | HR 52 | Ht 74.0 in | Wt 271.6 lb

## 2014-04-13 DIAGNOSIS — I481 Persistent atrial fibrillation: Secondary | ICD-10-CM

## 2014-04-13 DIAGNOSIS — I4819 Other persistent atrial fibrillation: Secondary | ICD-10-CM

## 2014-04-13 DIAGNOSIS — R001 Bradycardia, unspecified: Secondary | ICD-10-CM

## 2014-04-13 DIAGNOSIS — I10 Essential (primary) hypertension: Secondary | ICD-10-CM

## 2014-04-13 DIAGNOSIS — R55 Syncope and collapse: Secondary | ICD-10-CM

## 2014-04-13 DIAGNOSIS — I519 Heart disease, unspecified: Secondary | ICD-10-CM

## 2014-04-13 NOTE — Progress Notes (Signed)
PCP: Clent JacksMike Garrett  Joseph Garrett is a 53 y.o. male who presents today for electrophysiology follow up with h/o afib ablation x 2, last procedure 08/2012.  He continues to have palpitations at times.  He has had no further syncope.   His palpitations are accompanied by fatigue.  Mild shortness of breath, no  lower extremity edema, dizziness, presyncope, or syncope.   The patient is otherwise without complaint today.   Past Medical History  Diagnosis Date  . Paroxysmal atrial fibrillation   . TIA (transient ischemic attack)   . Hypercholesteremia   . Thrombocytopenia   . Hypertension   . Obesity   . Depression   . Gout   . Hives   . GERD (gastroesophageal reflux disease)   . Cirrhosis     liver scarring on biopsy, presumed to be due to methotrexate  . Psoriasis   . OSA (obstructive sleep apnea)     mild, uses CPAP  . CHF (congestive heart failure)   . DJD (degenerative joint disease)    Past Surgical History  Procedure Laterality Date  . Cholecystectomy  2011  . Nose surgery      Turbinates  . Reconstruction of nose    . Tee without cardioversion  03/16/2012    Procedure: TRANSESOPHAGEAL ECHOCARDIOGRAM (TEE);  Surgeon: Pricilla RifflePaula V Ross, MD;  [facility];  Service: Cardiovascular;  Laterality: N/A;  . Atrial fibrillation ablation  03/17/12    PVI and CTI ablation by Dr Johney FrameAllred  . Tendon repair      Right Forearm  . Tee without cardioversion N/A 09/17/2012    Procedure: TRANSESOPHAGEAL ECHOCARDIOGRAM (TEE);  Surgeon: Lewayne BuntingBrian S Crenshaw, MD;  [facility];  Service: Cardiovascular;  Laterality: N/A;  . Ablation of dysrhythmic focus  09/17/2012  . Cardioversion N/A 10/09/2012    Procedure: CARDIOVERSION;  Surgeon: Laurey Moralealton S McLean, MD;  [facility];  Service: Cardiovascular;  Laterality: N/A;  . Cardioversion N/A 02/22/2014    Procedure: CARDIOVERSION;  Surgeon: Quintella Reichertraci R Turner, MD;  Location: [facility] ENDOSCOPY;  Service: Cardiovascular;  Laterality: N/A;     Current Outpatient Prescriptions  Medication Sig Dispense Refill  . amiodarone (PACERONE) 200 MG tablet Take 1 tablet (200 mg total) by mouth daily.      . carvedilol (COREG) 3.125 MG tablet Take 1 tablet (3.125 mg total) by mouth 2 (two) times daily with a meal.  60 tablet  11  . Febuxostat (ULORIC) 80 MG TABS Take 80 mg by mouth daily.      . fluticasone (FLONASE) 50 MCG/ACT nasal spray Place 2 sprays into both nostrils 2 (two) times daily.      . halobetasol (ULTRAVATE) 0.05 % cream Apply 1 application topically daily as needed.      . halobetasol (ULTRAVATE) 0.05 % ointment Apply 1 application topically daily as needed.      Marland Kitchen. ibuprofen (ADVIL,MOTRIN) 200 MG tablet Take 800 mg by mouth every 6 (six) hours as needed (pain).      Marland Kitchen. lisinopril (PRINIVIL,ZESTRIL) 10 MG tablet Take 20 mg by mouth 2 (two) times daily.      . piroxicam (FELDENE) 20 MG capsule Take 20 mg by mouth daily.      . rivaroxaban (XARELTO) 20 MG TABS tablet Take 20 mg by mouth daily.      . sertraline (ZOLOFT) 100 MG tablet Take 150 mg by mouth daily.      . simvastatin (ZOCOR) 20 MG tablet Take 1 tablet (20 mg total) by  mouth daily.  30 tablet  3  . tamsulosin (FLOMAX) 0.4 MG CAPS capsule Take 0.8 mg by mouth daily.       Marland Kitchen tolterodine (DETROL LA) 4 MG 24 hr capsule Take 4 mg by mouth daily.      . traZODone (DESYREL) 100 MG tablet Take 100 mg by mouth at bedtime.      Marland Kitchen zolpidem (AMBIEN) 10 MG tablet Take 10 mg by mouth at bedtime.      . Calcium-Vitamin D-Vitamin K (CALCIUM SOFT CHEWS PO) Take 1 tablet by mouth daily.       No current facility-administered medications for this visit.    Physical Exam: Filed Vitals:   04/13/14 0922  BP: 124/82  Pulse: 52  Height: 6\' 2"  (1.88 m)  Weight: 271 lb 9.6 oz (123.197 kg)    GEN- The patient is well appearing, alert and oriented x 3 today.   Head- normocephalic, atraumatic Eyes-  Sclera clear, conjunctiva pink Ears- hearing intact Oropharynx- clear Lungs-  Clear to ausculation bilaterally, normal work of breathing Heart- Irregular rate and rhythm, no murmurs, rubs or gallops, PMI not laterally displaced GI- soft, NT, ND, + BS Extremities- no clubbing, cyanosis, or edema  ekg today reveals sinus bradycardia 52 bpm, otherwise normal ekg  Assessment and Plan:  1.  Persistent afib He continues to have palpitations We discussed implantation of an implantable loop recorder for afib management post ablation and to further evaluate his palpitations.  He is very interested in this prior to making any decision regarding either repeat ablation or a hybrid ablation approach. Continue amiodarone and anticoagulation  2. HTN Stable No change required today. Is losing weight, may need adjustment of med if  bp trends down.  3. OSA Compliance with CPAP advised  4. Nonischemic CM stable Repeat echo  In 6 months  5. Syncope No recurrence ILR will be helpful to evaluate for arrhythmia as the cause

## 2014-04-13 NOTE — Patient Instructions (Signed)
Please go to [employer] on 04/19/14 at 8:30 for [facility] Implant

## 2014-04-14 ENCOUNTER — Telehealth: Payer: Self-pay | Admitting: Internal Medicine

## 2014-04-14 NOTE — Telephone Encounter (Signed)
New Message     Pt calling wanting to cancel procedure in Cath Lab with Dr. [name], due to cost of procedure being done in hospital. Wanting a call back from nurse when procedure has been cancelled.

## 2014-04-14 NOTE — Telephone Encounter (Signed)
**Note De-Identified Salsabeel Gorelick Obfuscation** The pt is aware that this message is being sent to Dr [name] and his nurse. The pt is requesting a call back.

## 2014-04-15 NOTE — Telephone Encounter (Signed)
Crystal is aware and procedure is canceled

## 2014-04-19 ENCOUNTER — Ambulatory Visit (HOSPITAL_COMMUNITY)
Admission: RE | Admit: 2014-04-19 | Payer: PRIVATE HEALTH INSURANCE | Source: Ambulatory Visit | Admitting: Internal Medicine

## 2014-04-19 ENCOUNTER — Encounter (HOSPITAL_COMMUNITY): Admission: RE | Payer: Self-pay | Source: Ambulatory Visit

## 2014-04-19 SURGERY — LOOP RECORDER IMPLANT
Anesthesia: LOCAL

## 2014-04-25 ENCOUNTER — Telehealth: Payer: Self-pay | Admitting: Internal Medicine

## 2014-04-25 NOTE — Telephone Encounter (Signed)
New message  Pt called states that he is having problems with AFIB again. Please call back

## 2014-04-25 NOTE — Telephone Encounter (Signed)
Left message for patient to return my call.

## 2014-04-26 NOTE — Telephone Encounter (Addendum)
Called patient back. States he went back in to atrial fib on Sunday night and then into normal rhythm a few hours later. On Monday am he reports that he went in to atrial fib again with a heart rate in the 60's and is complaining of some SOB. He states that he cancelled the loop implant because of the cost. Thinks he needs another ablation or his meds changed. Advised he needs to be seen to change medications. Appointment made for him to see Dr.Allred on 10/29 at 1230 pm. Patient agreed to this appointment.

## 2014-04-26 NOTE — Telephone Encounter (Signed)
Follow up ° ° ° ° ° °Returning Kelly's call °

## 2014-04-28 ENCOUNTER — Ambulatory Visit (INDEPENDENT_AMBULATORY_CARE_PROVIDER_SITE_OTHER): Payer: PRIVATE HEALTH INSURANCE | Admitting: Internal Medicine

## 2014-04-28 ENCOUNTER — Encounter: Payer: Self-pay | Admitting: Internal Medicine

## 2014-04-28 VITALS — BP 96/60 | HR 58 | Ht 74.0 in | Wt 274.6 lb

## 2014-04-28 DIAGNOSIS — R55 Syncope and collapse: Secondary | ICD-10-CM

## 2014-04-28 DIAGNOSIS — I480 Paroxysmal atrial fibrillation: Secondary | ICD-10-CM

## 2014-04-28 DIAGNOSIS — I10 Essential (primary) hypertension: Secondary | ICD-10-CM

## 2014-04-28 DIAGNOSIS — I481 Persistent atrial fibrillation: Secondary | ICD-10-CM

## 2014-04-28 DIAGNOSIS — I4819 Other persistent atrial fibrillation: Secondary | ICD-10-CM

## 2014-04-28 NOTE — Progress Notes (Signed)
PCP: Clent JacksMike Duran  Joseph Garrett is a 53 y.o. male who presents today for electrophysiology follow up with h/o afib ablation x 2, last procedure 08/2012.  He went into atrial fibrillation over this past weekend.  He continues to be debilitated by his afib.   His palpitations are accompanied by fatigue.  Mild shortness of breath, no  lower extremity edema, dizziness, presyncope, or syncope.   The patient is otherwise without complaint today.   Past Medical History  Diagnosis Date  . Paroxysmal atrial fibrillation   . TIA (transient ischemic attack)   . Hypercholesteremia   . Thrombocytopenia   . Hypertension   . Obesity   . Depression   . Gout   . Hives   . GERD (gastroesophageal reflux disease)   . Cirrhosis     liver scarring on biopsy, presumed to be due to methotrexate  . Psoriasis   . OSA (obstructive sleep apnea)     mild, uses CPAP  . CHF (congestive heart failure)   . DJD (degenerative joint disease)    Past Surgical History  Procedure Laterality Date  . Cholecystectomy  2011  . Nose surgery      Turbinates  . Reconstruction of nose    . Tee without cardioversion  03/16/2012    Procedure: TRANSESOPHAGEAL ECHOCARDIOGRAM (TEE);  Surgeon: Pricilla RifflePaula V Ross, MD;  [facility];  Service: Cardiovascular;  Laterality: N/A;  . Atrial fibrillation ablation  03/17/12    PVI and CTI ablation by Dr Johney FrameAllred  . Tendon repair      Right Forearm  . Tee without cardioversion N/A 09/17/2012    Procedure: TRANSESOPHAGEAL ECHOCARDIOGRAM (TEE);  Surgeon: Lewayne BuntingBrian S Crenshaw, MD;  [facility];  Service: Cardiovascular;  Laterality: N/A;  . Ablation of dysrhythmic focus  09/17/2012  . Cardioversion N/A 10/09/2012    Procedure: CARDIOVERSION;  Surgeon: Laurey Moralealton S McLean, MD;  [facility];  Service: Cardiovascular;  Laterality: N/A;  . Cardioversion N/A 02/22/2014    Procedure: CARDIOVERSION;  Surgeon: Quintella Reichertraci R Turner, MD;  Location: [facility] ENDOSCOPY;  Service: Cardiovascular;   Laterality: N/A;    Current Outpatient Prescriptions  Medication Sig Dispense Refill  . amiodarone (PACERONE) 200 MG tablet Take 1 tablet (200 mg total) by mouth daily.      . carvedilol (COREG) 3.125 MG tablet Take 1 tablet (3.125 mg total) by mouth 2 (two) times daily with a meal.  60 tablet  11  . Febuxostat (ULORIC) 80 MG TABS Take 80 mg by mouth daily.      . fluticasone (FLONASE) 50 MCG/ACT nasal spray Place 2 sprays into both nostrils 2 (two) times daily.      . halobetasol (ULTRAVATE) 0.05 % cream Apply 1 application topically daily as needed (for psoriasis).       Marland Kitchen. ibuprofen (ADVIL,MOTRIN) 200 MG tablet Take 800 mg by mouth every 6 (six) hours as needed (pain).      Marland Kitchen. lisinopril (PRINIVIL,ZESTRIL) 10 MG tablet Take 20 mg by mouth 2 (two) times daily.      Marland Kitchen. oxymetazoline (AFRIN) 0.05 % nasal spray Place 2 sprays into both nostrils 2 (two) times daily as needed for congestion.      . piroxicam (FELDENE) 20 MG capsule Take 20 mg by mouth daily.      . rivaroxaban (XARELTO) 20 MG TABS tablet Take 20 mg by mouth daily.      . sertraline (ZOLOFT) 100 MG tablet Take 150 mg by mouth daily.      .Marland Kitchen  simvastatin (ZOCOR) 20 MG tablet Take 1 tablet (20 mg total) by mouth daily.  30 tablet  3  . tamsulosin (FLOMAX) 0.4 MG CAPS capsule Take 0.8 mg by mouth daily.       Marland Kitchen tolterodine (DETROL LA) 4 MG 24 hr capsule Take 4 mg by mouth daily.      . traZODone (DESYREL) 100 MG tablet Take 100 mg by mouth at bedtime.      Marland Kitchen zolpidem (AMBIEN) 10 MG tablet Take 15 mg by mouth at bedtime as needed.        No current facility-administered medications for this visit.    Physical Exam: Filed Vitals:   04/28/14 1250  BP: 96/60  Pulse: 58  Height: 6\' 2"  (1.88 m)  Weight: 274 lb 9.6 oz (124.558 kg)    GEN- The patient is well appearing, alert and oriented x 3 today.   Head- normocephalic, atraumatic Eyes-  Sclera clear, conjunctiva pink Ears- hearing intact Oropharynx- clear Lungs- Clear to  ausculation bilaterally, normal work of breathing Heart- Irregular rate and rhythm, no murmurs, rubs or gallops, PMI not laterally displaced GI- soft, NT, ND, + BS Extremities- no clubbing, cyanosis, or edema  ekg today reveals sinus bradycardia 58 bpm, otherwise normal ekg  Assessment and Plan:  1.  Persistent afib He continues to have episodic afib despite ablation x 2.  We previously discussed implantable loop recorder placement.  Due to costs, he has declined to proceed.  He would like to proceed with definitve therapy for his afib.  He has failed medical therapy with amiodarone.  Today, we discussed hybrid ablation vs surgical maze.  He is very interested in meeting with Dr Cornelius Moras to discuss MAZE.  I will therefore place referral today.  He will return to see me in follow-up in 6-8 week.s  Continue amiodarone and anticoagulation  2. HTN Stable No change required today.   3. OSA Compliance with CPAP advised  4. Nonischemic CM stable Repeat echo  In 6 months  5. Syncope No recurrence ILR will be helpful to evaluate for arrhythmia as the cause.  He declines monitor implantation at this time.

## 2014-04-28 NOTE — Patient Instructions (Signed)
You have been referred to [name] (cardiac surgery) for evaluation of MAZE procedure  Your physician recommends that you schedule a follow-up appointment in: 6 weeks with Dr. [name].  Your physician recommends that you continue on your current medications as directed. Please refer to the Current Medication list given to you today.

## 2014-05-04 ENCOUNTER — Other Ambulatory Visit: Payer: Self-pay | Admitting: Internal Medicine

## 2014-05-09 ENCOUNTER — Telehealth: Payer: Self-pay | Admitting: *Deleted

## 2014-05-09 ENCOUNTER — Encounter: Payer: Self-pay | Admitting: Thoracic Surgery (Cardiothoracic Vascular Surgery)

## 2014-05-09 ENCOUNTER — Other Ambulatory Visit: Payer: Self-pay | Admitting: *Deleted

## 2014-05-09 ENCOUNTER — Institutional Professional Consult (permissible substitution) (INDEPENDENT_AMBULATORY_CARE_PROVIDER_SITE_OTHER): Payer: PRIVATE HEALTH INSURANCE | Admitting: Thoracic Surgery (Cardiothoracic Vascular Surgery)

## 2014-05-09 VITALS — BP 126/82 | HR 56 | Resp 20 | Ht 74.0 in | Wt 274.0 lb

## 2014-05-09 DIAGNOSIS — I4819 Other persistent atrial fibrillation: Secondary | ICD-10-CM

## 2014-05-09 DIAGNOSIS — I480 Paroxysmal atrial fibrillation: Secondary | ICD-10-CM

## 2014-05-09 DIAGNOSIS — I481 Persistent atrial fibrillation: Secondary | ICD-10-CM

## 2014-05-09 DIAGNOSIS — I5042 Chronic combined systolic (congestive) and diastolic (congestive) heart failure: Secondary | ICD-10-CM

## 2014-05-09 DIAGNOSIS — R55 Syncope and collapse: Secondary | ICD-10-CM

## 2014-05-09 NOTE — Telephone Encounter (Signed)
LMTCB

## 2014-05-09 NOTE — Progress Notes (Addendum)
[address]       [place] [address]             [phone]     CARDIOTHORACIC SURGERY CONSULTATION REPORT  Referring Provider is Hillis Range, MD PCP is Verl Bangs, MD  Chief Complaint  Patient presents with  . Atrial Fibrillation    surgical eval for MAZE procedure    HPI:  Patient is a 53 year old white male with long-standing history of atrial fibrillation, hypertension, hyperlipidemia, chronic combined systolic and diastolic congestive heart failure, and obstructive sleep apnea referred for possible surgical treatment of atrial fibrillation. The patient reportedly was first diagnosed with paroxysmal atrial fibrillation more than 15 years ago. Over the subsequent years he developed increasing frequency and duration of symptoms of tachypalpitations and shortness of breath associated with paroxysmal atrial fibrillation. For a period of time he was followed by Dr. Shelva Majestic and treated with digoxin and Coumadin. He reportedly underwent cardioversions on several occasions and was placed on flecainide for at least 3 years, during which time his care was transferred to [facility] in [place]. Symptoms continued to progressively get worse, and ultimately the patient was referred to Dr. [name] for ablation. He underwent transcatheter radiofrequency ablation in September 2013 and initially did well, but persistent atrial fibrillation recurred within six months.  He underwent repeat ablation in March 2014, after which he again developed early recurrence of atrial fibrillation.  Since then he has been treated with amiodarone and undergone DC cardioversion on several occasions.  Transesophageal echocardiogram performed at the time of his most recent ablation in March 2014 demonstrated significant left ventricular systolic dysfunction with ejection fraction estimated 25-30% and mild to moderate mitral regurgitation. Follow up echocardiogram performed in  March 2015 revealed improvement in left ventricular systolic function with ejection fraction estimated 45-50% and only mild mitral regurgitation.  However, since then the patient has continued to experience recurrent episodes of tachypalpitations associated with increased exertional shortness of breath, dizzy spells, and a syncopal episode this past August.  He has been followed carefully by Dr. [name] who saw him 02/21/2014, shortly after his syncopal episode.  At that time the patient was back in rate controlled atrial fibrillation.  His syncopal episode was felt likely to be related to postural hypotension.  Amiodarone dose was increased and the patient underwent DC cardioversion 02/22/2014.  Since then he has been seen in follow-up on several occasions, and each time he has remained in sinus rhythm with a relatively slow rate. However, the patient continues to complain of intermittent episodes of palpitations with irregular heart rhythm, increased dyspnea with exertion and atypical chest discomfort. The patient has been referred for surgical consultation to consider maze procedure.  The patient is married and lives with his wife in [place]. He lost his job this past summer, having previously worked in the [employer] exercise [employer] business for many years. He remains reasonably active physically, although he does not exercise on a regular basis.  He describes stable symptoms of mild exertional shortness of breath. For the most part this does not limit his day-to-day activities, although when he experiences palpitations consistent with recurrent atrial fibrillation he becomes much more symptomatic with more severe exertional shortness of breath and fatigue. He reports intermittent tightness across his chest that occasionally radiates to his throat. This may or may not be related to physical activity. He has not had any syncopal episodes over the past 2 months. He still has  occasional dizzy  spells.  Past Medical History  Diagnosis Date  . Paroxysmal atrial fibrillation   . TIA (transient ischemic attack)   . Hypercholesteremia   . Thrombocytopenia   . Hypertension   . Obesity   . Depression   . Gout   . Hives   . GERD (gastroesophageal reflux disease)   . Cirrhosis     liver scarring on biopsy, presumed to be due to methotrexate  . Psoriasis   . OSA (obstructive sleep apnea)     mild, uses CPAP  . CHF (congestive heart failure)   . DJD (degenerative joint disease)   . Syncope 02/21/2014  . Persistent atrial fibrillation 03/02/2012  . Essential hypertension 08/30/2013  . Chronic combined systolic and diastolic CHF (congestive heart failure)     Past Surgical History  Procedure Laterality Date  . Cholecystectomy  2011  . Nose surgery      Turbinates  . Reconstruction of nose    . Tee without cardioversion  03/16/2012    Procedure: TRANSESOPHAGEAL ECHOCARDIOGRAM (TEE);  Surgeon: [name], MD;  Location: [facility] ENDOSCOPY;  Service: Cardiovascular;  Laterality: N/A;  . Atrial fibrillation ablation  03/17/12    PVI and CTI ablation by Dr [name]  . Tendon repair      Right Forearm  . Tee without cardioversion N/A 09/17/2012    Procedure: TRANSESOPHAGEAL ECHOCARDIOGRAM (TEE);  Surgeon: Lewayne Bunting, MD;  Location: [facility] ENDOSCOPY;  Service: Cardiovascular;  Laterality: N/A;  . Ablation of dysrhythmic focus  09/17/2012  . Cardioversion N/A 10/09/2012    Procedure: CARDIOVERSION;  Surgeon: Laurey Morale, MD;  Location: [facility] ENDOSCOPY;  Service: Cardiovascular;  Laterality: N/A;  . Cardioversion N/A 02/22/2014    Procedure: CARDIOVERSION;  Surgeon: Quintella Reichert, MD;  Location: [facility] ENDOSCOPY;  Service: Cardiovascular;  Laterality: N/A;    Family History  Problem Relation Age of Onset  . Heart disease    . Diabetes    . Heart attack Father     History   Social History  . Marital Status: Married    Spouse Name: N/A    Number of Children: N/A  . Years of Education:  N/A   Occupational History  . Not on file.   Social History Main Topics  . Smoking status: Never Smoker   . Smokeless tobacco: Never Used  . Alcohol Use: No  . Drug Use: No  . Sexual Activity: Not on file   Other Topics Concern  . Not on file   Social History Narrative   Lives in [place] with spouse.  3 children are healthy.     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    Current Outpatient Prescriptions  Medication Sig Dispense Refill  . amiodarone (PACERONE) 200 MG tablet Take 1 tablet (200 mg total) by mouth daily.    . carvedilol (COREG) 3.125 MG tablet Take 1 tablet (3.125 mg total) by mouth 2 (two) times daily with a meal. 60 tablet 11  . Febuxostat (ULORIC) 80 MG TABS Take 80 mg by mouth daily.    . fluticasone (FLONASE) 50 MCG/ACT nasal spray Place 2 sprays into both nostrils 2 (two) times daily.    . halobetasol (ULTRAVATE) 0.05 % cream Apply 1 application topically daily as needed (for psoriasis).     Marland Kitchen ibuprofen (ADVIL,MOTRIN) 200 MG tablet Take 800 mg by mouth every 6 (six) hours as needed (pain).    Marland Kitchen lisinopril (PRINIVIL,ZESTRIL) 10 MG tablet Take 20 mg by  mouth 2 (two) times daily.    Marland Kitchen oxymetazoline (AFRIN) 0.05 % nasal spray Place 2 sprays into both nostrils 2 (two) times daily as needed for congestion.    . piroxicam (FELDENE) 20 MG capsule Take 20 mg by mouth daily.    . sertraline (ZOLOFT) 100 MG tablet Take 150 mg by mouth daily.    . simvastatin (ZOCOR) 20 MG tablet Take 1 tablet (20 mg total) by mouth daily. 30 tablet 3  . tamsulosin (FLOMAX) 0.4 MG CAPS capsule Take 0.8 mg by mouth daily.     Marland Kitchen tolterodine (DETROL LA) 4 MG 24 hr capsule Take 4 mg by mouth daily.    . traZODone (DESYREL) 100 MG tablet Take 100 mg by mouth at bedtime.    Carlena Hurl 20 MG TABS tablet TAKE 1 TABLET BY MOUTH DAILY 30 tablet 3  . zolpidem (AMBIEN) 10 MG tablet Take 15 mg by mouth at bedtime as needed.      No current facility-administered medications for  this visit.    Allergies  Allergen Reactions  . Penicillins Rash      Review of Systems:   General:  normal appetite, decreased energy, no weight gain, + weight loss, no fever  Cardiac:  occasional chest pain with exertion, occasional chest pain at rest, + SOB with exertion, no resting SOB, no PND, no orthopnea, + palpitations, + arrhythmia, + atrial fibrillation, no LE edema, + dizzy spells, + syncope  Respiratory:  + exertional shortness of breath, no home oxygen, no productive cough, no dry cough, no bronchitis, no wheezing, no hemoptysis, no asthma, no pain with inspiration or cough, + sleep apnea, + CPAP at night  GI:   no difficulty swallowing, no reflux, no frequent heartburn, no hiatal hernia, no abdominal pain, no constipation, no diarrhea, + hematochezia w/ subsequent colonoscopy for polpyectomy w/in the past year, no hematemesis, no melena  GU:   no dysuria,  + frequency, no urinary tract infection, no hematuria, + enlarged prostate, no kidney stones, no kidney disease  Vascular:  no pain suggestive of claudication, no pain in feet, no leg cramps, no varicose veins, no DVT, no non-healing foot ulcer  Neuro:   no stroke, no TIA's, no seizures, no headaches, no temporary blindness one eye,  no slurred speech, no peripheral neuropathy, no chronic pain, no instability of gait, no memory/cognitive dysfunction  Musculoskeletal: + arthritis, + joint swelling, no myalgias, no difficulty walking, normal mobility   Skin:   no rash, no itching, no skin infections, no pressure sores or ulcerations  Psych:   no anxiety, + depression, no nervousness, no unusual recent stress  Eyes:   + blurry vision, + floaters, + recent vision changes with cataract left eye, + wears glasses or contacts  ENT:   no hearing loss, no loose or painful teeth, no dentures, last saw dentist several years ago  Hematologic:  + easy bruising, + abnormal bleeding, no clotting disorder, no frequent  epistaxis  Endocrine:  no diabetes, does not check CBG's at home     Physical Exam:   BP 126/82 mmHg  Pulse 56  Resp 20  Ht 6\' 2"  (1.88 m)  Wt 274 lb (124.286 kg)  BMI 35.16 kg/m2  SpO2 97%  General:  Moderately obese, o/w  well-appearing  HEENT:  Unremarkable   Neck:   no JVD, no bruits, no adenopathy   Chest:   clear to auscultation, symmetrical breath sounds, no wheezes, no rhonchi   CV:  RRR, no murmur   Abdomen:  soft, non-tender, no masses   Extremities:  warm, well-perfused, pulses diminished but palpable, no LE edema  Rectal/GU  Deferred  Neuro:   Grossly non-focal and symmetrical throughout  Skin:   Clean and dry, no rashes, no breakdown   Diagnostic Tests:  EKG 12-lead:  The last 4 EKGs performed since August of this year demonstrate sinus bradycardia.   Transesophageal Echocardiography  Patient:  Joseph Garrett, Ronel J MR #:    [id number] Study Date: 09/17/2012 Gender:   M Age:    [age] Height:   188cm Weight:   138.2kg BSA:    2.10658m^2 Pt. Status: [facility]    [facility]  ADMITTING  Olga Millersrenshaw, Brian ATTENDING  Crenshaw, Arlys JohnBrian PERFORMING  Olga Millersrenshaw, Brian SONOGRAPHER Georgian CoLauren Williams, RDCS, CCT Bonnita HollowDERING   Edmisten, Brooke cc:  ------------------------------------------------------------ LV EF: 25% -  30%  ------------------------------------------------------------ Indications:   Atrial fibrillation - 427.31.  ------------------------------------------------------------ Study Conclusions  - Left ventricle: Systolic function was severely reduced. The estimated ejection fraction was in the range of 25% to 30%. Diffuse hypokinesis. - Aortic valve: No evidence of vegetation. - Mitral valve: No evidence of vegetation. Mild to moderate regurgitation. - Left atrium: The atrium was mildly dilated. No evidence of thrombus in the atrial cavity or appendage. - Atrial septum: No defect or patent foramen ovale  was identified. - Tricuspid valve: No evidence of vegetation. Transesophageal echocardiography. 2D and color Doppler. Height: Height: 188cm. Height: 74in. Weight: Weight: 138.2kg. Weight: 304lb. Body mass index: BMI: 39.1kg/m^2. Body surface area:  BSA: 2.8158m^2. Blood pressure: 124/77. Patient status: Outpatient. Location: Endoscopy.  ------------------------------------------------------------  ------------------------------------------------------------ Left ventricle: Systolic function was severely reduced. The estimated ejection fraction was in the range of 25% to 30%. Diffuse hypokinesis.  ------------------------------------------------------------ Aortic valve:  Structurally normal valve.  Cusp separation was normal. No evidence of vegetation. Doppler:  No regurgitation.  ------------------------------------------------------------ Aorta: Descending aorta: The descending aorta had mild diffuse atherosclerotic disease.  ------------------------------------------------------------ Mitral valve:  Structurally normal valve.  Leaflet separation was normal. No evidence of vegetation. Doppler: Mild to moderate regurgitation.  ------------------------------------------------------------ Left atrium: The atrium was mildly dilated. No evidence of thrombus in the atrial cavity or appendage.  ------------------------------------------------------------ Atrial septum: No defect or patent foramen ovale was identified.  ------------------------------------------------------------ Right ventricle: The cavity size was normal. Systolic function was normal.  ------------------------------------------------------------ Pulmonic valve:  Doppler:  No significant regurgitation.  ------------------------------------------------------------ Tricuspid valve:  Structurally normal valve.  Leaflet separation was normal. No evidence of vegetation.  Doppler: Mild regurgitation.  ------------------------------------------------------------ Right atrium: The atrium was normal in size.  ------------------------------------------------------------ Pericardium: There was no pericardial effusion.  ------------------------------------------------------------ Prepared and Electronically Authenticated by  Olga Millersrenshaw, Brian 2014-03-20T16:35:34.650   Transthoracic Echocardiography  Patient:  Joseph Garrett, Mylo J MR #:    [id number] Study Date: 09/16/2013 Gender:   M Age:    52 Height:   190.5cm Weight:   134.7kg BSA:    2.941m^2 Pt. Status: Room:  ATTENDING  Dala DockSkains, Mark ORDERING   James Allred, MD REFERRING  Hillis RangeJames Allred, MD SONOGRAPHER Junious DresserVanessa Poole, RDCS PERFORMING  [facility], Outpatient cc:  ------------------------------------------------------------ LV EF: 45% -  50%  ------------------------------------------------------------ Indications:   429.9, Systolic LV dysfunction. Atrial fibrillation - 427.31.  ------------------------------------------------------------ History:  PMH: OSA. Acquired from the patient and from the patient's chart. Fatigue. Atrial fibrillation. Sinus bradycardia. Left ventricular dysfunction, with an ejection fraction of 35%by echocardiography. The dysfunction is primarily systolic. Transient ischemic attack. Risk factors: Hypertension. Morbidly obese.  ------------------------------------------------------------ Study Conclusions  - Left  ventricle: The cavity size was normal. Wall thickness was normal. Systolic function was mildly reduced. The estimated ejection fraction was in the range of 45% to 50%. Wall motion was normal; there were no regional wall motion abnormalities. - Left atrium: The atrium was mildly dilated. - Right ventricle: The cavity size was mildly to moderately dilated. Wall thickness was normal. Impressions:  -  When compared to prior echo, EF is much improved.  ------------------------------------------------------------ Labs, prior tests, procedures, and surgery: Echocardiography (February 2014).   EF was 35%.  Cardioversion. Transthoracic echocardiography. M-mode, complete 2D, spectral Doppler, and color Doppler. Height: Height: 190.5cm. Height: 75in. Weight: Weight: 134.7kg. Weight: 296.4lb. Body mass index: BMI: 37.1kg/m^2. Body surface area:  BSA: 2.54m^2. Blood pressure:   130/75. Patient status: Outpatient. Location: [facility]  ------------------------------------------------------------  ------------------------------------------------------------ Left ventricle: The cavity size was normal. Wall thickness was normal. Systolic function was mildly reduced. The estimated ejection fraction was in the range of 45% to 50%. Wall motion was normal; there were no regional wall motion abnormalities. There was no evidence of elevated ventricular filling pressure by Doppler parameters.  ------------------------------------------------------------ Aortic valve:  Structurally normal valve.  Cusp separation was normal. Doppler: Transvalvular velocity was within the normal range. There was no stenosis. No regurgitation.  ------------------------------------------------------------ Aorta: The aorta was normal, not dilated, and non-diseased.  ------------------------------------------------------------ Mitral valve:  Structurally normal valve.  Leaflet separation was normal. Doppler: Transvalvular velocity was within the normal range. There was no evidence for stenosis. No regurgitation.  ------------------------------------------------------------ Left atrium: The atrium was mildly dilated.  ------------------------------------------------------------ Right ventricle: The cavity size was mildly to moderately dilated. Wall thickness was normal.  Systolic function was normal.  ------------------------------------------------------------ Pulmonic valve:  Poorly visualized. Structurally normal valve.  Cusp separation was normal. Doppler: Transvalvular velocity was within the normal range. No regurgitation.  ------------------------------------------------------------ Tricuspid valve:  Structurally normal valve.  Leaflet separation was normal. Doppler: Transvalvular velocity was within the normal range. No regurgitation.  ------------------------------------------------------------ Pulmonary artery:  The main pulmonary artery was normal-sized.  ------------------------------------------------------------ Right atrium: The atrium was normal in size.  ------------------------------------------------------------ Pericardium: The pericardium was normal in appearance. There was no pericardial effusion.  ------------------------------------------------------------ Systemic veins: Inferior vena cava: The vessel was normal in size; the respirophasic diameter changes were in the normal range (= 50%); findings are consistent with normal central venous pressure.  ------------------------------------------------------------ Post procedure conclusions Ascending Aorta:  - The aorta was normal, not dilated, and non-diseased.  ------------------------------------------------------------  2D measurements    Normal Doppler measurements  Normal Left ventricle         Left ventricle LVID ED,  50.9 mm   43-52  Ea, lat   14. cm/s  ------ chord,             ann, tiss   1 PLAX              DP LVID ES,  35.8 mm   23-38  E/Ea, lat  4.9    ------ chord,             ann, tiss   4 PLAX              DP FS, chord,  30 %   >29   Ea, med   10. cm/s  ------ PLAX              ann, tiss   3 LVPW, ED  8.08 mm   ------  DP IVS/LVPW  1.15    <1.3  E/Ea, med  6.7    ------ ratio, ED           ann, tiss   6 Ventricular septum       DP IVS, ED  9.27 mm   ------ LVOT LVOT              Peak vel, S 91 cm/s  ------ Diam, S   24 mm   ------ VTI, S    20 cm   ------ Area    4.52 cm^2  ------ Stroke vol 90. ml   ------ Diam     24 mm   ------        5 Aorta             Stroke   34. ml/m^2 ------ Root diam,  34 mm   ------ index     8 ED               Mitral valve Left atrium          Peak E vel 69. cm/s  ------ AP dim    41 mm   ------        6 AP dim   1.58 cm/m^2 <2.2  Peak A vel 70. cm/s  ------ index                    6                Deceleratio 229 ms   150-23                n time         0                Peak E/A   1    ------                ratio                Right ventricle                Sa vel, lat 12. cm/s  ------                ann, tiss   4                DP  ------------------------------------------------------------ Prepared and Electronically Authenticated by  Donato Schultz 2015-03-19T10:02:41.483    Impression:  Patient has long-standing history of chronic atrial fibrillation despite previously undergoing catheter-based ablation on 2 occasions, most recently in March 2014. Since then the patient has had several recurrences of persistent atrial fibrillation requiring DC cardioversion despite long term amiodarone therapy, most recently on 02/22/2014. Since then he has been maintaining sinus rhythm, although he continues to experience intermittent episodes of palpitations with acute exacerbations of shortness of breath and chest discomfort suspicious for paroxysmal atrial  fibrillation.  Holter monitor or implantable loop recorder might be useful to sort out whether or not the patient continues to have episodes of atrial fibrillation and/or symptomatic bradycardia.  I have personally reviewed the patient's most recent transthoracic and transesophageal echocardiograms.  He has been diagnosed with nonischemic cardiomyopathy, although his most recent echocardiogram demonstrated some improvement in left ventricular systolic function with ejection fraction estimated at 45-50%. He does not appear to have significant valvular heart disease.  He has never undergone diagnostic cardiac catheterization and his most recent stress test was several years ago.  Options for long-term management of atrial fibrillation include long-term  medical therapy versus surgical ablation.  Given his recent syncopal episode and long-standing history of atrial fibrillation, he may be at somewhat increased risk for the need to place a permanent pacemaker after surgical ablation.  Plan:  I discussed the natural history of atrial fibrillation and long-term treatment strategies at length with the patient in the office today. The rationale for surgical therapy was discussed as were a variety of surgical options.  The patient is interested in considering surgical intervention in the near future. We will obtain a transesophageal echocardiogram and diagnostic cardiac catheterization as soon as practical. The patient will be seen in follow-up once these diagnostic tests have been accomplished to discuss treatment options further. All of his questions have been addressed.   I spent in excess of 90 minutes during the conduct of this office consultation and >50% of this time involved direct face-to-face encounter with the patient for counseling and/or coordination of their care.    Salvatore Decent. Cornelius Moras, MD 05/09/2014 11:33 AM

## 2014-05-09 NOTE — Telephone Encounter (Signed)
Follow up  ° ° ° °Returning call back to nurse  °

## 2014-05-09 NOTE — Telephone Encounter (Signed)
Thurston Hole, cub Cornelius Moras wants me to do tee + left/right heart cath some time in next 1-2 wks on Joseph Garrett [id number] dob[birth year]. Pt of Allred, needs surgical maze for afib. Will need to hold xarelto 2 days prior to procedure & day of. Needs labs. No contrast allergy. Should be expecting call about procedures. Best to schedule on reader or DOD day

## 2014-05-09 NOTE — Telephone Encounter (Signed)
LM for pt to call back to arrange date for procedures.

## 2014-05-09 NOTE — Telephone Encounter (Signed)
Pt would like to try to schedule on Friday Nov 13,2015. I will follow up with patient tomorrow once I have been able to schedule procedures.

## 2014-05-10 NOTE — Telephone Encounter (Signed)
LMTCB for patient.

## 2014-05-10 NOTE — Telephone Encounter (Signed)
TEE 11AM (arrive 9:30AM) and R and LHC 12 Noon --scheduled for 05/13/14.

## 2014-05-10 NOTE — Telephone Encounter (Signed)
We set Joseph Garrett up for TEE and cath on Friday but he cancelled procedures because they are going to be "too expensive".  I would be glad to see him in the office and discuss or you could talk to him if you think he could be convinced, let me know what you think would be best.

## 2014-05-10 NOTE — Telephone Encounter (Signed)
I called pt to go over instructions for TEE and R and LHC scheduled for Friday 05/13/14.  Pt stated he did not want to have procedures at this time, it was going to be too expensive. I advised pt that I would cancel procedures and notify Dr Cornelius Moras and Dr Shirlee Latch.

## 2014-05-11 ENCOUNTER — Encounter: Payer: Self-pay | Admitting: *Deleted

## 2014-05-11 NOTE — Telephone Encounter (Signed)
Pt decided that he would like to proceed with TEE and R and LHC. This has been rescheduled for Friday November 13,2015, TEE 11AM  R and LHC 12Noon. Pt advised to hold Xarelto tonight and tomorrow night.  Pt scheduled for lab 05/12/14.

## 2014-05-12 ENCOUNTER — Other Ambulatory Visit (INDEPENDENT_AMBULATORY_CARE_PROVIDER_SITE_OTHER): Payer: PRIVATE HEALTH INSURANCE | Admitting: *Deleted

## 2014-05-12 DIAGNOSIS — I4819 Other persistent atrial fibrillation: Secondary | ICD-10-CM

## 2014-05-12 DIAGNOSIS — I481 Persistent atrial fibrillation: Secondary | ICD-10-CM

## 2014-05-12 LAB — PROTIME-INR
INR: 1 ratio (ref 0.8–1.0)
PROTHROMBIN TIME: 11.3 s (ref 9.6–13.1)

## 2014-05-12 LAB — CBC WITH DIFFERENTIAL/PLATELET
BASOS ABS: 0 10*3/uL (ref 0.0–0.1)
Basophils Relative: 0.4 % (ref 0.0–3.0)
EOS PCT: 3.4 % (ref 0.0–5.0)
Eosinophils Absolute: 0.1 10*3/uL (ref 0.0–0.7)
HCT: 38.3 % — ABNORMAL LOW (ref 39.0–52.0)
Hemoglobin: 12.7 g/dL — ABNORMAL LOW (ref 13.0–17.0)
LYMPHS ABS: 0.8 10*3/uL (ref 0.7–4.0)
Lymphocytes Relative: 18.8 % (ref 12.0–46.0)
MCHC: 33.2 g/dL (ref 30.0–36.0)
MCV: 93.3 fl (ref 78.0–100.0)
MONO ABS: 0.4 10*3/uL (ref 0.1–1.0)
MONOS PCT: 9.9 % (ref 3.0–12.0)
NEUTROS PCT: 67.5 % (ref 43.0–77.0)
Neutro Abs: 2.9 10*3/uL (ref 1.4–7.7)
Platelets: 158 10*3/uL (ref 150.0–400.0)
RBC: 4.11 Mil/uL — ABNORMAL LOW (ref 4.22–5.81)
RDW: 13.5 % (ref 11.5–15.5)
WBC: 4.3 10*3/uL (ref 4.0–10.5)

## 2014-05-12 LAB — BASIC METABOLIC PANEL
BUN: 22 mg/dL (ref 6–23)
CALCIUM: 8.9 mg/dL (ref 8.4–10.5)
CO2: 24 meq/L (ref 19–32)
Chloride: 110 mEq/L (ref 96–112)
Creatinine, Ser: 1.1 mg/dL (ref 0.4–1.5)
GFR: 70.85 mL/min (ref >60.00)
GLUCOSE: 99 mg/dL (ref 70–99)
POTASSIUM: 4.3 meq/L (ref 3.5–5.1)
SODIUM: 140 meq/L (ref 135–145)

## 2014-05-13 ENCOUNTER — Encounter (HOSPITAL_COMMUNITY)
Admission: RE | Disposition: A | Discharge status: Home / Self Care | Payer: Self-pay | Source: Ambulatory Visit | Attending: Cardiology

## 2014-05-13 ENCOUNTER — Encounter (HOSPITAL_COMMUNITY): Admission: RE | Payer: Self-pay | Source: Ambulatory Visit

## 2014-05-13 ENCOUNTER — Ambulatory Visit (HOSPITAL_COMMUNITY): Admission: RE | Admit: 2014-05-13 | Payer: PRIVATE HEALTH INSURANCE | Source: Ambulatory Visit | Admitting: Cardiology

## 2014-05-13 ENCOUNTER — Other Ambulatory Visit (HOSPITAL_COMMUNITY): Payer: Self-pay | Admitting: Cardiology

## 2014-05-13 ENCOUNTER — Encounter (HOSPITAL_COMMUNITY): Payer: Self-pay

## 2014-05-13 ENCOUNTER — Ambulatory Visit (HOSPITAL_COMMUNITY)
Admission: RE | Admit: 2014-05-13 | Discharge: 2014-05-13 | Disposition: A | Discharge status: Home / Self Care | Payer: PRIVATE HEALTH INSURANCE | Source: Ambulatory Visit | Attending: Cardiology | Admitting: Cardiology

## 2014-05-13 ENCOUNTER — Telehealth: Payer: Self-pay | Admitting: Internal Medicine

## 2014-05-13 DIAGNOSIS — I059 Rheumatic mitral valve disease, unspecified: Secondary | ICD-10-CM

## 2014-05-13 DIAGNOSIS — Z5309 Procedure and treatment not carried out because of other contraindication: Secondary | ICD-10-CM | POA: Insufficient documentation

## 2014-05-13 SURGERY — CANCELLED PROCEDURE

## 2014-05-13 SURGERY — ECHOCARDIOGRAM, TRANSESOPHAGEAL
Anesthesia: Moderate Sedation

## 2014-05-13 SURGERY — LEFT AND RIGHT HEART CATHETERIZATION WITH CORONARY ANGIOGRAM
Anesthesia: LOCAL

## 2014-05-13 MED ORDER — SODIUM CHLORIDE 0.9 % IV SOLN: INTRAVENOUS | Status: DC

## 2014-05-13 NOTE — Progress Notes (Signed)
Patient's procedure cancelled due to patient eating breakfast @ 0730 this am. Patient was offered to stay or come back for later procedure but patient chose to cancel both TEE and heart cath and does not want to reschedule due to starting new job. MD and cath lab made aware.

## 2014-05-13 NOTE — Telephone Encounter (Signed)
New message  Pt called to resch Surgery.. Please call back to assist/sr

## 2014-05-17 ENCOUNTER — Encounter (HOSPITAL_COMMUNITY): Payer: PRIVATE HEALTH INSURANCE

## 2014-05-17 ENCOUNTER — Ambulatory Visit (HOSPITAL_COMMUNITY): Payer: PRIVATE HEALTH INSURANCE | Admitting: Nurse Practitioner

## 2014-05-18 NOTE — Telephone Encounter (Signed)
Called patient to reschedule both the TEE and R/L heart cath with Dr. Shirlee Latch.  He says he does not want to proceed with any procedure at this time as he just got a job and starts Monday.  He can not afford to be off for 5 weeks.  He is going to cancel his office visit with [name] also.  He will call back when he is ready to proceed

## 2014-05-23 ENCOUNTER — Ambulatory Visit: Payer: PRIVATE HEALTH INSURANCE | Admitting: Thoracic Surgery (Cardiothoracic Vascular Surgery)

## 2014-06-09 ENCOUNTER — Encounter (HOSPITAL_COMMUNITY): Payer: Self-pay | Admitting: Internal Medicine

## 2014-06-20 ENCOUNTER — Ambulatory Visit: Payer: BC Managed Care – PPO | Admitting: Internal Medicine

## 2014-06-21 ENCOUNTER — Encounter: Payer: Self-pay | Admitting: Internal Medicine

## 2014-07-01 ENCOUNTER — Other Ambulatory Visit: Payer: Self-pay | Admitting: Internal Medicine

## 2014-07-11 NOTE — Progress Notes (Signed)
RIO 2 Informed Consent  Subject Name: Joseph Garrett  Subject met inclusion and exclusion criteria. The informed consent form, study requirements and expectations were reviewed with the subject and questions and concerns were addressed prior to the signing of the consent form. The subject verbalized understanding of the trail requirements. The subject agreed to participate in the RIO 2 trial and signed the informed consent. The informed consent was obtained prior to performance of any protocol-specific procedures for the subject. A copy of the signed informed consent was given to the subject and a copy was placed in the subject's medical record.   Raquel Sarna Dewan Emond  04/13/2014

## 2014-08-30 HISTORY — PX: ELBOW ARTHROSCOPY: SUR87

## 2014-09-02 ENCOUNTER — Telehealth: Payer: Self-pay | Admitting: Internal Medicine

## 2014-09-02 ENCOUNTER — Encounter (HOSPITAL_COMMUNITY): Payer: Self-pay | Admitting: Family Medicine

## 2014-09-02 ENCOUNTER — Emergency Department (HOSPITAL_COMMUNITY)
Admission: EM | Admit: 2014-09-02 | Discharge: 2014-09-02 | Disposition: A | Discharge status: Home / Self Care | Payer: BLUE CROSS/BLUE SHIELD | Attending: Emergency Medicine | Admitting: Emergency Medicine

## 2014-09-02 ENCOUNTER — Emergency Department (HOSPITAL_COMMUNITY): Payer: BLUE CROSS/BLUE SHIELD

## 2014-09-02 DIAGNOSIS — I482 Chronic atrial fibrillation, unspecified: Secondary | ICD-10-CM

## 2014-09-02 DIAGNOSIS — K219 Gastro-esophageal reflux disease without esophagitis: Secondary | ICD-10-CM | POA: Diagnosis not present

## 2014-09-02 DIAGNOSIS — E669 Obesity, unspecified: Secondary | ICD-10-CM | POA: Diagnosis not present

## 2014-09-02 DIAGNOSIS — Z8673 Personal history of transient ischemic attack (TIA), and cerebral infarction without residual deficits: Secondary | ICD-10-CM | POA: Diagnosis not present

## 2014-09-02 DIAGNOSIS — I10 Essential (primary) hypertension: Secondary | ICD-10-CM

## 2014-09-02 DIAGNOSIS — I5042 Chronic combined systolic (congestive) and diastolic (congestive) heart failure: Secondary | ICD-10-CM | POA: Diagnosis not present

## 2014-09-02 DIAGNOSIS — I4819 Other persistent atrial fibrillation: Secondary | ICD-10-CM

## 2014-09-02 DIAGNOSIS — Z7952 Long term (current) use of systemic steroids: Secondary | ICD-10-CM | POA: Diagnosis not present

## 2014-09-02 DIAGNOSIS — G4733 Obstructive sleep apnea (adult) (pediatric): Secondary | ICD-10-CM | POA: Diagnosis not present

## 2014-09-02 DIAGNOSIS — E780 Pure hypercholesterolemia: Secondary | ICD-10-CM | POA: Diagnosis not present

## 2014-09-02 DIAGNOSIS — I480 Paroxysmal atrial fibrillation: Secondary | ICD-10-CM

## 2014-09-02 DIAGNOSIS — M1990 Unspecified osteoarthritis, unspecified site: Secondary | ICD-10-CM | POA: Diagnosis not present

## 2014-09-02 DIAGNOSIS — Z880 Allergy status to penicillin: Secondary | ICD-10-CM | POA: Insufficient documentation

## 2014-09-02 DIAGNOSIS — R002 Palpitations: Secondary | ICD-10-CM | POA: Diagnosis present

## 2014-09-02 DIAGNOSIS — Z79899 Other long term (current) drug therapy: Secondary | ICD-10-CM | POA: Insufficient documentation

## 2014-09-02 DIAGNOSIS — R079 Chest pain, unspecified: Secondary | ICD-10-CM

## 2014-09-02 DIAGNOSIS — Z9981 Dependence on supplemental oxygen: Secondary | ICD-10-CM | POA: Diagnosis not present

## 2014-09-02 LAB — BASIC METABOLIC PANEL
Anion gap: 6 (ref 5–15)
BUN: 12 mg/dL (ref 6–23)
CALCIUM: 9 mg/dL (ref 8.4–10.5)
CHLORIDE: 106 mmol/L (ref 96–112)
CO2: 27 mmol/L (ref 19–32)
CREATININE: 1.1 mg/dL (ref 0.50–1.35)
GFR calc non Af Amer: 74 mL/min — ABNORMAL LOW (ref >90)
GFR, EST AFRICAN AMERICAN: 86 mL/min — AB (ref >90)
Glucose, Bld: 87 mg/dL (ref 70–99)
Potassium: 4.1 mmol/L (ref 3.5–5.1)
Sodium: 139 mmol/L (ref 135–145)

## 2014-09-02 LAB — CBC
HCT: 39.2 % (ref 39.0–52.0)
Hemoglobin: 13.4 g/dL (ref 13.0–17.0)
MCH: 31.9 pg (ref 26.0–34.0)
MCHC: 34.2 g/dL (ref 30.0–36.0)
MCV: 93.3 fL (ref 78.0–100.0)
PLATELETS: 152 10*3/uL (ref 150–400)
RBC: 4.2 MIL/uL — AB (ref 4.22–5.81)
RDW: 12.8 % (ref 11.5–15.5)
WBC: 4.9 10*3/uL (ref 4.0–10.5)

## 2014-09-02 LAB — PROTIME-INR
INR: 1.44 (ref 0.00–1.49)
Prothrombin Time: 17.7 seconds — ABNORMAL HIGH (ref 11.6–15.2)

## 2014-09-02 LAB — I-STAT TROPONIN, ED: TROPONIN I, POC: 0.01 ng/mL (ref 0.00–0.08)

## 2014-09-02 LAB — BRAIN NATRIURETIC PEPTIDE: B NATRIURETIC PEPTIDE 5: 126.6 pg/mL — AB (ref 0.0–100.0)

## 2014-09-02 MED ORDER — AMIODARONE HCL 200 MG PO TABS: 200.0000 mg | ORAL_TABLET | Freq: Two times a day (BID) | ORAL | Status: DC

## 2014-09-02 MED ORDER — PROPOFOL 10 MG/ML IV BOLUS
INTRAVENOUS | Status: DC | PRN
  Administered 2014-09-02: 124.3 mg via INTRAVENOUS

## 2014-09-02 MED ORDER — LISINOPRIL 10 MG PO TABS: 10.0000 mg | ORAL_TABLET | Freq: Two times a day (BID) | ORAL | Status: DC

## 2014-09-02 MED ORDER — AMIODARONE HCL 200 MG PO TABS
200.0000 mg | ORAL_TABLET | Freq: Once | ORAL | Status: AC
  Administered 2014-09-02: 200 mg via ORAL
  Filled 2014-09-02: qty 1

## 2014-09-02 MED ORDER — PROPOFOL 10 MG/ML IV BOLUS
1.0000 mg/kg | Freq: Once | INTRAVENOUS | Status: AC
  Filled 2014-09-02: qty 20

## 2014-09-02 NOTE — Telephone Encounter (Signed)
Patient currently driving and calling in to report that he has been experiencing two solid days of A Fib. He states that today, he has developed increased dizziness and lightheadedness with accompanying SOB which is getting worse as the day goes on. He states his HR is 70 but he "clearly can feel the A Fib". He states that he was presently out of work and he has missed a week or so of his Amiodarone because he had not refilled it yet. Reviewed with Dr. Excell Seltzer. Patient advised to go to Emergency Room at this time due to symptomatic A Fib. Patient cautioned against driving at this time. He verbalized understanding and said, "I'm in my work truck so I have to take it back". Informed him to the dangers of driving while he is experiencing A Fib/dizziness/SOB. Patient verbalized understanding. Patient states he will get to ED as soon as possible.

## 2014-09-02 NOTE — Consult Note (Cosign Needed)
Referring Physician: ED Primary Cardiologist: Allred  Reason for Consultation: Recurrent AF   HPI:  Joseph Garrett is a 54 y.o. male with h/o psoriasis, obesity, HTN, HL, previous TIA, afib ablation x 2, last procedure 08/2012 who presents with recurrent symptomatic AF.  Was being worked up for surgical [place] but deferred as he lost his job. Was maintained on amio 200 bid but ran out 3 weeks ago. Has been taking Xarelto without interruption.   Over past 3 days has had palpitations and been back in AF. Today got lightheaded and weak. Came to ER SBP 90-100. ECG with recurrent AF at 105.     Review of Systems:     Cardiac Review of Systems: {Y] = yes [ ]  = no  Chest Pain [    ]  Resting SOB [   ] Exertional SOB  [ y ]  Orthopnea [  ]   Pedal Edema [   ]    Palpitations [  ] Syncope  [  ]   Presyncope [place]ienne   ]  General Review of Systems: [Y] = yes [  ]=no Constitional: recent weight change [  ]; anorexia [  ]; fatigue [ y ]; nausea [  ]; night sweats [  ]; fever [  ]; or chills [  ];                 Eyes : blurred vision [  ]; diplopia [   ]; vision changes [  ];  Amaurosis fugax[  ]; Resp: cough [  ];  wheezing[  ];  hemoptysis[  ];  PND [  ];  GI:  gallstones[  ], vomiting[  ];  dysphagia[  ]; melena[  ];  hematochezia [  ]; heartburn[  ];   GU: kidney stones [  ]; hematuria[  ];   dysuria [  ];  nocturia[  ]; incontinence [  ];             Skin: rash, swelling[ y ];, hair loss[y  ];  peripheral edema[  ];  or itching[  ]; Musculosketetal: myalgias[  ];  joint swelling[  ];  joint erythema[  ];  joint pain[y  ];  back pain[  ];  Heme/Lymph: bruising[  ];  bleeding[  ];  anemia[  ];  Neuro: TIA[  ];  headaches[  ];  stroke[y  ];  vertigo[  ];  seizures[  ];   paresthesias[  ];  difficulty walking[  ];  Psych:depression[ y ]; Joseph Garrett  ];  Endocrine: diabetes[  ];  thyroid dysfunction[  ];  Other:  Past Medical History  Diagnosis Date  . Paroxysmal atrial fibrillation   . TIA  (transient ischemic attack)   . Hypercholesteremia   . Thrombocytopenia   . Hypertension   . Obesity   . Depression   . Gout   . Hives   . GERD (gastroesophageal reflux disease)   . Cirrhosis     liver scarring on biopsy, presumed to be due to methotrexate  . Psoriasis   . OSA (obstructive sleep apnea)     mild, uses CPAP  . CHF (congestive heart failure)   . DJD (degenerative joint disease)   . Syncope 02/21/2014  . Persistent atrial fibrillation 03/02/2012  . Essential hypertension 08/30/2013  . Chronic combined systolic and diastolic CHF (congestive heart failure)     (Not in a hospital admission)   Infusions:   Allergies  Allergen Reactions  .  Penicillins Rash    History   Social History  . Marital Status: Married    Spouse Name: N/A  . Number of Children: N/A  . Years of Education: N/A   Occupational History  . Not on file.   Social History Main Topics  . Smoking status: Never Smoker   . Smokeless tobacco: Never Used  . Alcohol Use: No  . Drug Use: No  . Sexual Activity: Not on file   Other Topics Concern  . Not on file   Social History Narrative   Lives in [place] with spouse.  3 children are healthy.     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    Family History  Problem Relation Age of Onset  . Heart disease    . Diabetes    . Heart attack Father     PHYSICAL EXAM: Filed Vitals:   09/02/14 1730  BP: 104/77  Pulse:   Temp:   Resp: 17   HR 105 No intake or output data in the 24 hours ending 09/02/14 1747  General:  Well appearing. No respiratory difficulty HEENT: normal Neck: supple. no JVD. Carotids 2+ bilat; no bruits. No lymphadenopathy or thryomegaly appreciated. Cor: PMI nondisplaced. Irregular rate & rhythm. No rubs, gallops or murmurs. Lungs: clear Abdomen: obese, soft, nontender, nondistended. No hepatosplenomegaly. No bruits or masses. Good bowel sounds. Extremities: no cyanosis, clubbing, rash,  edema Neuro: alert & oriented x 3, cranial nerves grossly intact. moves all 4 extremities w/o difficulty. Affect pleasant.  ECG: AF with RVR at 105  Results for orders placed or performed during the hospital encounter of 09/02/14 (from the past 24 hour(s))  Basic metabolic panel     Status: Abnormal   Collection Time: 09/02/14  3:11 PM  Result Value Ref Range   Sodium 139 135 - 145 mmol/L   Potassium 4.1 3.5 - 5.1 mmol/L   Chloride 106 96 - 112 mmol/L   CO2 27 19 - 32 mmol/L   Glucose, Bld 87 70 - 99 mg/dL   BUN 12 6 - 23 mg/dL   Creatinine, Ser 1.61 0.50 - 1.35 mg/dL   Calcium 9.0 8.4 - 09.6 mg/dL   GFR calc non Af Amer 74 (L) >90 mL/min   GFR calc Af Amer 86 (L) >90 mL/min   Anion gap 6 5 - 15  CBC     Status: Abnormal   Collection Time: 09/02/14  3:11 PM  Result Value Ref Range   WBC 4.9 4.0 - 10.5 K/uL   RBC 4.20 (L) 4.22 - 5.81 MIL/uL   Hemoglobin 13.4 13.0 - 17.0 g/dL   HCT 04.5 40.9 - 81.1 %   MCV 93.3 78.0 - 100.0 fL   MCH 31.9 26.0 - 34.0 pg   MCHC 34.2 30.0 - 36.0 g/dL   RDW 91.4 78.2 - 95.6 %   Platelets 152 150 - 400 K/uL  Protime-INR - (only if patient is taking Coumadin)     Status: Abnormal   Collection Time: 09/02/14  3:11 PM  Result Value Ref Range   Prothrombin Time 17.7 (H) 11.6 - 15.2 seconds   INR 1.44 0.00 - 1.49  Brain natriuretic peptide - ONLY if shortness of breath has been DOCUMENTED in THIS visit     Status: Abnormal   Collection Time: 09/02/14  3:11 PM  Result Value Ref Range   B Natriuretic Peptide 126.6 (H) 0.0 - 100.0 pg/mL  I-stat troponin, ED (do not order at [facility])  Status: None   Collection Time: 09/02/14  3:19 PM  Result Value Ref Range   Troponin i, poc 0.01 0.00 - 0.08 ng/mL   Comment 3           Dg Chest 2 View  09/02/2014   CLINICAL DATA:  Recurrent atrial fibrillation.  EXAM: CHEST  2 VIEW  COMPARISON:  None.  FINDINGS: Artifact overlies chest. Heart size is normal. Mediastinal shadows are normal. The lungs are clear. No  bronchial thickening. No infiltrate, mass, effusion or collapse. Pulmonary vascularity is normal. No bony abnormality.  IMPRESSION: Normal chest   Electronically Signed   By: Paulina Fusi M.D.   On: 09/02/2014 15:29    ASSESSMENT: 1. Recurrent AF with RVR    --CHADsVASC = 4 (HTN, TIA, HF) 2. H/o NICM    --last EF 45-50% echo 3/15 3. OSA 4. HTN 5. H/o CVA  PLAN/DISCUSSION:  He has recurrent symptomatic PAF with RVR in setting of running out of amiodarone. He has been fully compliant with Xarelto for > 4 weeks. Last meal 6:15am.  Will plan DC-CV in ER and resume amiodarone. Can f/u with Dr. [name].  Truman Hayward 5:55 PM

## 2014-09-02 NOTE — ED Notes (Addendum)
Consent for cardioversion obtained.

## 2014-09-02 NOTE — Sedation Documentation (Addendum)
Shock delivered,  200 j.

## 2014-09-02 NOTE — Telephone Encounter (Signed)
New Message    Patient is experience A-Fib and needs to speak to a nurse.   This is his number.   [phone]

## 2014-09-02 NOTE — Telephone Encounter (Signed)
Confirmed patient is at the hospital. Consult by Dr. Gala Romney- per his note- plan for DCCV in the ER.

## 2014-09-02 NOTE — ED Provider Notes (Signed)
Procedural sedation Performed by: Tilden Fossa Consent: Verbal consent obtained. Risks and benefits: risks, benefits and alternatives were discussed Required items: required blood products, implants, devices, and special equipment available Patient identity confirmed: arm band and provided demographic data Time out: Immediately prior to procedure a "time out" was called to verify the correct patient, procedure, equipment, support staff and site/side marked as required.  Sedation type: moderate (conscious) sedation NPO time confirmed and considedered  Sedatives: PROPOFOL  Physician Time at Bedside: 30 minutes  Vitals: Vital signs were monitored during sedation. Cardiac Monitor, pulse oximeter Patient tolerance: Patient tolerated the procedure well with no immediate complications. Comments: Pt with uneventful recovered. Returned to pre-procedural sedation baseline   Pt sedated with propofol for cardioversion by Cardiology.  Pt tolerated procedure well.  Pt did require brief BVM ventilation.    Tilden Fossa, MD 09/03/14 [phone]

## 2014-09-02 NOTE — Telephone Encounter (Signed)
F/U       Pt calling, states he was instructed to go to the hospital.  Pt states he is at the hospital now.

## 2014-09-02 NOTE — ED Notes (Signed)
Pt here for 3 days of afib. sts worse and more constant today. sts SOB, dizziness, and chest pain,.

## 2014-09-02 NOTE — Discharge Instructions (Signed)
Atrial Fibrillation °Atrial fibrillation is a type of irregular heart rhythm (arrhythmia). During atrial fibrillation, the upper chambers of the heart (atria) quiver continuously in a chaotic pattern. This causes an irregular and often rapid heart rate.  °Atrial fibrillation is the result of the heart becoming overloaded with disorganized signals that tell it to beat. These signals are normally released one at a time by a part of the right atrium called the sinoatrial node. They then travel from the atria to the lower chambers of the heart (ventricles), causing the atria and ventricles to contract and pump blood as they pass. In atrial fibrillation, parts of the atria outside of the sinoatrial node also release these signals. This results in two problems. First, the atria receive so many signals that they do not have time to fully contract. Second, the ventricles, which can only receive one signal at a time, beat irregularly and out of rhythm with the atria.  °There are three types of atrial fibrillation:  °· Paroxysmal. Paroxysmal atrial fibrillation starts suddenly and stops on its own within a week. °· Persistent. Persistent atrial fibrillation lasts for more than a week. It may stop on its own or with treatment. °· Permanent. Permanent atrial fibrillation does not go away. Episodes of atrial fibrillation may lead to permanent atrial fibrillation. °Atrial fibrillation can prevent your heart from pumping blood normally. It increases your risk of stroke and can lead to heart failure.  °CAUSES  °· Heart conditions, including a heart attack, heart failure, coronary artery disease, and heart valve conditions.   °· Inflammation of the sac that surrounds the heart (pericarditis). °· Blockage of an artery in the lungs (pulmonary embolism). °· Pneumonia or other infections. °· Chronic lung disease. °· Thyroid problems, especially if the thyroid is overactive (hyperthyroidism). °· Caffeine, excessive alcohol use, and use  of some illegal drugs.   °· Use of some medicines, including certain decongestants and diet pills. °· Heart surgery.   °· Birth defects.   °Sometimes, no cause can be found. When this happens, the atrial fibrillation is called lone atrial fibrillation. The risk of complications from atrial fibrillation increases if you have lone atrial fibrillation and you are age 60 years or older. °RISK FACTORS °· Heart failure. °· Coronary artery disease. °· Diabetes mellitus.   °· High blood pressure (hypertension).   °· Obesity.   °· Other arrhythmias.   °· Increased age. °SIGNS AND SYMPTOMS  °· A feeling that your heart is beating rapidly or irregularly.   °· A feeling of discomfort or pain in your chest.   °· Shortness of breath.   °· Sudden light-headedness or weakness.   °· Getting tired easily when exercising.   °· Urinating more often than normal (mainly when atrial fibrillation first begins).   °In paroxysmal atrial fibrillation, symptoms may start and suddenly stop. °DIAGNOSIS  °Your health care provider may be able to detect atrial fibrillation when taking your pulse. Your health care provider may have you take a test called an ambulatory electrocardiogram (ECG). An ECG records your heartbeat patterns over a 24-hour period. You may also have other tests, such as: °· Transthoracic echocardiogram (TTE). During echocardiography, sound waves are used to evaluate how blood flows through your heart. °· Transesophageal echocardiogram (TEE). °· Stress test. There is more than one type of stress test. If a stress test is needed, ask your health care provider about which type is best for you. °· Chest X-ray exam. °· Blood tests. °· Computed tomography (CT). °TREATMENT  °Treatment may include: °· Treating any underlying conditions. For example, if you   have an overactive thyroid, treating the condition may correct atrial fibrillation.  Taking medicine. Medicines may be given to control a rapid heart rate or to prevent blood  clots, heart failure, or a stroke.  Having a procedure to correct the rhythm of the heart:  Electrical cardioversion. During electrical cardioversion, a controlled, low-energy shock is delivered to the heart through your skin. If you have chest pain, very low blood pressure, or sudden heart failure, this procedure may need to be done as an emergency.  Catheter ablation. During this procedure, heart tissues that send the signals that cause atrial fibrillation are destroyed.  Surgical ablation. During this surgery, thin lines of heart tissue that carry the abnormal signals are destroyed. This procedure can either be an open-heart surgery or a minimally invasive surgery. With the minimally invasive surgery, small cuts are made to access the heart instead of a large opening.  Pulmonary venous isolation. During this surgery, tissue around the veins that carry blood from the lungs (pulmonary veins) is destroyed. This tissue is thought to carry the abnormal signals. HOME CARE INSTRUCTIONS   Take medicines only as directed by your health care provider. Some medicines can make atrial fibrillation worse or recur.  If blood thinners were prescribed by your health care provider, take them exactly as directed. Too much blood-thinning medicine can cause bleeding. If you take too little, you will not have the needed protection against stroke and other problems.  Perform blood tests at home if directed by your health care provider. Perform blood tests exactly as directed.  Quit smoking if you smoke.  Do not drink alcohol.  Do not drink caffeinated beverages such as coffee, soda, and some teas. You may drink decaffeinated coffee, soda, or tea.   Maintain a healthy weight.Do not use diet pills unless your health care provider approves. They may make heart problems worse.   Follow diet instructions as directed by your health care provider.  Exercise regularly as directed by your health care  provider.  Keep all follow-up visits as directed by your health care provider. This is important. PREVENTION  The following substances can cause atrial fibrillation to recur:   Caffeinated beverages.  Alcohol.  Certain medicines, especially those used for breathing problems.  Certain herbs and herbal medicines, such as those containing ephedra or ginseng.  Illegal drugs, such as cocaine and amphetamines. Sometimes medicines are given to prevent atrial fibrillation from recurring. Proper treatment of any underlying condition is also important in helping prevent recurrence.  SEEK MEDICAL CARE IF:  You notice a change in the rate, rhythm, or strength of your heartbeat.  You suddenly begin urinating more frequently.  You tire more easily when exerting yourself or exercising. SEEK IMMEDIATE MEDICAL CARE IF:   You have chest pain, abdominal pain, sweating, or weakness.  You feel nauseous.  You have shortness of breath.  You suddenly have swollen feet and ankles.  You feel dizzy.  Your face or limbs feel numb or weak.  You have a change in your vision or speech. MAKE SURE YOU:   Understand these instructions.  Will watch your condition.  Will get help right away if you are not doing well or get worse. Document Released: 06/17/2005 Document Revised: 11/01/2013 Document Reviewed: 07/28/2012 [facility] Patient Information 2015 [place], [place]. This information is not intended to replace advice given to you by your health care provider. Make sure you discuss any questions you have with your health care provider.   F/u with Cardiology.  Take Amiodarone as prescribed.

## 2014-09-02 NOTE — ED Notes (Signed)
Dr. Rees at bedside  

## 2014-09-02 NOTE — CV Procedure (Signed)
     DIRECT CURRENT CARDIOVERSION  NAME:  Joseph Garrett   MRN: [id number] DOB:  04/29/1961   ADMIT DATE: 09/02/2014   INDICATIONS: Atrial fibrillation    PROCEDURE:   Informed consent was obtained prior to the procedure. The risks, benefits and alternatives for the procedure were discussed and the patient comprehended these risks. Once an appropriate time out was taken, the patient had the defibrillator pads placed in the anterior and posterior position. The patient then underwent sedation by Dr. Madilyn Hook in the ER with IV propofol 120mg . Once an appropriate level of sedation was achieved, the patient received a single biphasic, synchronized 200J shock with prompt conversion to sinus rhythm. No apparent complications.  Truman Hayward 6:19 PM

## 2014-09-02 NOTE — ED Provider Notes (Signed)
CSN: [id number]     Arrival date & time 09/02/14  1419 History   First MD Initiated Contact with Patient 09/02/14 1500     Chief Complaint  Patient presents with  . Atrial Fibrillation     (Consider location/radiation/quality/duration/timing/severity/associated sxs/prior Treatment) Patient is a 54 y.o. male presenting with atrial fibrillation. The history is provided by the patient and the spouse. No language interpreter was used.  Atrial Fibrillation Pertinent negatives include no abdominal pain, chills, fever, headaches, nausea, vomiting or weakness.  Mr. Gear is a 54 y/o male who has a history of atrial fibrillation and hypertension who presents for "atrial fibrillation" which began as sharp intermittent episodes of chest pain 3 days ago but is constant today with dizzy spells when ambulating. He stopped taking his Amiodarone a few weeks ago because he couldn't get it refilled. He is currently anticoagulated with Xarelto. He denies, shortness of breath, nausea, vomiting, leg swelling or new medication.   Past Medical History  Diagnosis Date  . Paroxysmal atrial fibrillation   . TIA (transient ischemic attack)   . Hypercholesteremia   . Thrombocytopenia   . Hypertension   . Obesity   . Depression   . Gout   . Hives   . GERD (gastroesophageal reflux disease)   . Cirrhosis     liver scarring on biopsy, presumed to be due to methotrexate  . Psoriasis   . OSA (obstructive sleep apnea)     mild, uses CPAP  . CHF (congestive heart failure)   . DJD (degenerative joint disease)   . Syncope 02/21/2014  . Persistent atrial fibrillation 03/02/2012  . Essential hypertension 08/30/2013  . Chronic combined systolic and diastolic CHF (congestive heart failure)    Past Surgical History  Procedure Laterality Date  . Cholecystectomy  2011  . Nose surgery      Turbinates  . Reconstruction of nose    . Tee without cardioversion  03/16/2012    Procedure: TRANSESOPHAGEAL ECHOCARDIOGRAM (TEE);   Surgeon: [name], MD;  Location: [facility] ENDOSCOPY;  Service: Cardiovascular;  Laterality: N/A;  . Atrial fibrillation ablation  03/17/12    PVI and CTI ablation by Dr [name]  . Tendon repair      Right Forearm  . Tee without cardioversion N/A 09/17/2012    Procedure: TRANSESOPHAGEAL ECHOCARDIOGRAM (TEE);  Surgeon: Lewayne Bunting, MD;  Location: [facility] ENDOSCOPY;  Service: Cardiovascular;  Laterality: N/A;  . Ablation of dysrhythmic focus  09/17/2012  . Cardioversion N/A 10/09/2012    Procedure: CARDIOVERSION;  Surgeon: Laurey Morale, MD;  Location: [facility] ENDOSCOPY;  Service: Cardiovascular;  Laterality: N/A;  . Cardioversion N/A 02/22/2014    Procedure: CARDIOVERSION;  Surgeon: Quintella Reichert, MD;  Location: [facility] ENDOSCOPY;  Service: Cardiovascular;  Laterality: N/A;  . Atrial fibrillation ablation N/A 03/17/2012    Procedure: ATRIAL FIBRILLATION ABLATION;  Surgeon: Hillis Range, MD;  Location: [facility] CATH LAB;  Service: Cardiovascular;  Laterality: N/A;  . Atrial fibrillation ablation N/A 09/18/2012    Procedure: ATRIAL FIBRILLATION ABLATION;  Surgeon: Hillis Range, MD;  Location: [facility] CATH LAB;  Service: Cardiovascular;  Laterality: N/A;   Family History  Problem Relation Age of Onset  . Heart disease    . Diabetes    . Heart attack Father    History  Substance Use Topics  . Smoking status: Never Smoker   . Smokeless tobacco: Never Used  . Alcohol Use: No    Review of Systems  Constitutional: Negative for fever and chills.  Respiratory: Negative for chest tightness, shortness of breath and wheezing.   Cardiovascular: Positive for palpitations.  Gastrointestinal: Negative for nausea, vomiting and abdominal pain.  Neurological: Positive for dizziness. Negative for seizures, syncope, weakness, light-headedness and headaches.  All other systems reviewed and are negative.     Allergies  Penicillins  Home Medications   Prior to Admission medications   Medication Sig Start Date End Date  Taking? Authorizing Provider  carvedilol (COREG) 3.125 MG tablet Take 1 tablet (3.125 mg total) by mouth 2 (two) times daily with a meal. 03/10/14  Yes Scott T Weaver, PA-C  Febuxostat (ULORIC) 80 MG TABS Take 80 mg by mouth daily. 01/11/14  Yes Historical Provider, MD  fluticasone (FLONASE) 50 MCG/ACT nasal spray Place 2 sprays into both nostrils 2 (two) times daily.   Yes Historical Provider, MD  halobetasol (ULTRAVATE) 0.05 % cream Apply 1 application topically daily as needed (for psoriasis).    Yes Historical Provider, MD  halobetasol (ULTRAVATE) 0.05 % cream Apply 1 application topically 2 (two) times daily as needed (FOR PSORIASIS).   Yes Historical Provider, MD  ibuprofen (ADVIL,MOTRIN) 800 MG tablet Take 1,600 mg by mouth 2 (two) times daily as needed for moderate pain.   Yes Historical Provider, MD  sertraline (ZOLOFT) 100 MG tablet Take 150 mg by mouth daily.   Yes Historical Provider, MD  simvastatin (ZOCOR) 20 MG tablet TAKE 1 TABLET BY MOUTH EVERY DAY 07/04/14  Yes Hillis Range, MD  tamsulosin (FLOMAX) 0.4 MG CAPS capsule Take 0.8 mg by mouth daily.    Yes Historical Provider, MD  tolterodine (DETROL LA) 4 MG 24 hr capsule Take 4 mg by mouth daily.   Yes Historical Provider, MD  traZODone (DESYREL) 100 MG tablet Take 100 mg by mouth at bedtime.   Yes Historical Provider, MD  XARELTO 20 MG TABS tablet TAKE 1 TABLET BY MOUTH DAILY Patient taking differently: TAKE 1 TABLET BY MOUTH AT BEDTIME 05/05/14  Yes Hillis Range, MD  amiodarone (PACERONE) 200 MG tablet Take 1 tablet (200 mg total) by mouth 2 (two) times daily. 09/02/14   Azalee Course, PA  amiodarone (PACERONE) 200 MG tablet Take 1 tablet (200 mg total) by mouth 2 (two) times daily. 09/02/14   Kearah Gayden Patel-Mills, PA-C  lisinopril (PRINIVIL,ZESTRIL) 10 MG tablet Take 1 tablet (10 mg total) by mouth 2 (two) times daily. 09/02/14   Deyra Perdomo Patel-Mills, PA-C   BP 110/76 mmHg  Pulse 55  Temp(Src) 97.5 F (36.4 C) (Oral)  Resp 16  Wt 274 lb 0.5 oz  (124.3 kg)  SpO2 99% Physical Exam  Constitutional: He is oriented to person, place, and time. He appears well-developed and well-nourished.  HENT:  Head: Normocephalic and atraumatic.  Eyes: Conjunctivae are normal. Pupils are equal, round, and reactive to light.  Neck: Normal range of motion. Neck supple.  Cardiovascular: Normal heart sounds.  An irregularly irregular rhythm present. Tachycardia present.   Pulmonary/Chest: Effort normal and breath sounds normal.  Abdominal: Soft. There is no tenderness.  Musculoskeletal: Normal range of motion.  Neurological: He is alert and oriented to person, place, and time. He has normal strength.  Skin: Skin is warm and dry.  Nursing note and vitals reviewed.   ED Course  Procedures (including critical care time) Labs Review Labs Reviewed  BASIC METABOLIC PANEL - Abnormal; Notable for the following:    GFR calc non Af Amer 74 (*)    GFR calc Af Amer 86 (*)    All other components  within normal limits  CBC - Abnormal; Notable for the following:    RBC 4.20 (*)    All other components within normal limits  PROTIME-INR - Abnormal; Notable for the following:    Prothrombin Time 17.7 (*)    All other components within normal limits  BRAIN NATRIURETIC PEPTIDE - Abnormal; Notable for the following:    B Natriuretic Peptide 126.6 (*)    All other components within normal limits  I-STAT TROPOININ, ED    Imaging Review Dg Chest 2 View  09/02/2014   CLINICAL DATA:  Recurrent atrial fibrillation.  EXAM: CHEST  2 VIEW  COMPARISON:  None.  FINDINGS: Artifact overlies chest. Heart size is normal. Mediastinal shadows are normal. The lungs are clear. No bronchial thickening. No infiltrate, mass, effusion or collapse. Pulmonary vascularity is normal. No bony abnormality.  IMPRESSION: Normal chest   Electronically Signed   By: Paulina Fusi M.D.   On: 09/02/2014 15:29     EKG Interpretation   Date/Time:  Friday September 02 2014 18:17:00 EST Ventricular  Rate:  60 PR Interval:  176 QRS Duration: 95 QT Interval:  420 QTC Calculation: 420 R Axis:   -3 Text Interpretation:  Age not entered, assumed to be  54 years old for  purpose of ECG interpretation Sinus rhythm Consider left atrial  enlargement Low voltage, precordial leads Baseline wander in lead(s) V2  Confirmed by Lincoln Brigham [phone]) on 09/02/2014 6:24:10 PM      MDM   Final diagnoses:  Chronic atrial fibrillation   The patient reportedly was first diagnosed with paroxysmal atrial fibrillation more than 20 years ago.  He reportedly underwent cardioversions on several occasions. He underwent two ablations after which he again developed early recurrence of atrial fibrillation. Since then he has been treated with amiodarone and undergone DC cardioversion on several occasions.He has been followed by Dr. [name]. Amiodarone dose was increased and the patient underwent DC cardioversion 02/22/2014. Since then he has been seen in follow-up on several occasions, and each time he has remained in sinus rhythm with a relatively slow rate. However, the patient continues to complain of intermittent episodes of palpitations with irregular heart rhythm, increased dyspnea with exertion and atypical chest discomfort. The patient has been referred for surgical consultation to consider maze procedure. He refused do to financial concerns and loss of his job.   He has since stop taking his amiodarone for the last few weeks because he could not get his prescriptions refilled. He is currently being anticoagulated on Xarelto.  CBC and BMP are within normal limits. PT/INR is 17.7 and patient is on Xarelto. BNP shows no CHF. Troponin negative.  CXR shows no infiltrate, effusion, or mass.   Will consult cardiology to see if we should restart the amiodarone 200mg  BID.  17:38 Dr. Milas Kocher says he will come down and cardiovert the patient and agrees with restarting his amiodarone.  17:55 Dr. Madilyn Hook has ordered propofol  and the patient has been cardioverted with Dr. Milas Kocher at bedside. I refilled his amiodarone and lisinopril. After cardioversion patient is in sinus rhythm with a rate in the 60's. Repeat orthostatic vitals look good. Will discharge with cardiology f/u.      Catha Gosselin, PA-C 09/03/14 0013  Tilden Fossa, MD 09/03/14 0030

## 2014-09-02 NOTE — ED Notes (Signed)
Pt ambulated to restroom. 

## 2014-09-03 NOTE — Telephone Encounter (Signed)
Will ask Joseph Garrett to arrange follow-up in the AF clinic this week

## 2014-09-06 ENCOUNTER — Ambulatory Visit (INDEPENDENT_AMBULATORY_CARE_PROVIDER_SITE_OTHER): Payer: BLUE CROSS/BLUE SHIELD | Admitting: Nurse Practitioner

## 2014-09-06 ENCOUNTER — Encounter: Payer: Self-pay | Admitting: Nurse Practitioner

## 2014-09-06 VITALS — BP 122/68 | HR 64 | Ht 74.5 in | Wt 271.4 lb

## 2014-09-06 DIAGNOSIS — I481 Persistent atrial fibrillation: Secondary | ICD-10-CM

## 2014-09-06 DIAGNOSIS — I4819 Other persistent atrial fibrillation: Secondary | ICD-10-CM

## 2014-09-06 DIAGNOSIS — I480 Paroxysmal atrial fibrillation: Secondary | ICD-10-CM

## 2014-09-06 MED ORDER — AMIODARONE HCL 200 MG PO TABS: ORAL_TABLET | ORAL | Status: DC

## 2014-09-06 NOTE — Patient Instructions (Signed)
Your physician recommends that you schedule a follow-up appointment in: 6 weeks with Dr. [name]  Your physician has recommended you make the following change in your medication:  1)decrease amiodarone to 200mg  once a day on 09/23/2014

## 2014-09-06 NOTE — Progress Notes (Signed)
PCP: Joseph Garrett is a 54 y.o. male who presents today for ER follow up with h/o afib ablation x 2, last procedure 08/2012.  He went into atrial fibrillation last Friday after going into symptomatic afib with dizziness and sys bp of 100 . He had failed to take amiodarone x 2 weeks. States was waiting for drugstore to get permission to refill drug. He was cardioverted in the ER and placed back on amiodarone 200 mg bid. He woke up early this am in afib but was able to go back to sleep and when he awoke later, he was in SR.  On last visit, he was referred to [name] for a maze procedure and was getting ready to have procedure when he lost his job, therefore is now waiting to have the procedure to not jeopardize his new position with the time required for the procedure and recovery.  However, he says his af has really behaved until recently when accidentally  got off amiodarone.Compliant with xarelto without bleeding issues.    The patient is otherwise without complaint today.   Past Medical History  Diagnosis Date  . Paroxysmal atrial fibrillation   . TIA (transient ischemic attack)   . Hypercholesteremia   . Thrombocytopenia   . Hypertension   . Obesity   . Depression   . Gout   . Hives   . GERD (gastroesophageal reflux disease)   . Cirrhosis     liver scarring on biopsy, presumed to be due to methotrexate  . Psoriasis   . OSA (obstructive sleep apnea)     mild, uses CPAP  . CHF (congestive heart failure)   . DJD (degenerative joint disease)   . Syncope 02/21/2014  . Persistent atrial fibrillation 03/02/2012  . Essential hypertension 08/30/2013  . Chronic combined systolic and diastolic CHF (congestive heart failure)    Past Surgical History  Procedure Laterality Date  . Cholecystectomy  2011  . Nose surgery      Turbinates  . Reconstruction of nose    . Tee without cardioversion  03/16/2012    Procedure: TRANSESOPHAGEAL ECHOCARDIOGRAM (TEE);  Surgeon: [name], MD;   Location: [facility] ENDOSCOPY;  Service: Cardiovascular;  Laterality: N/A;  . Atrial fibrillation ablation  03/17/12    PVI and CTI ablation by Dr [name]  . Tendon repair      Right Forearm  . Tee without cardioversion N/A 09/17/2012    Procedure: TRANSESOPHAGEAL ECHOCARDIOGRAM (TEE);  Surgeon: Lewayne Bunting, MD;  Location: [facility] ENDOSCOPY;  Service: Cardiovascular;  Laterality: N/A;  . Ablation of dysrhythmic focus  09/17/2012  . Cardioversion N/A 10/09/2012    Procedure: CARDIOVERSION;  Surgeon: Laurey Morale, MD;  Location: [facility] ENDOSCOPY;  Service: Cardiovascular;  Laterality: N/A;  . Cardioversion N/A 02/22/2014    Procedure: CARDIOVERSION;  Surgeon: Quintella Reichert, MD;  Location: [facility] ENDOSCOPY;  Service: Cardiovascular;  Laterality: N/A;  . Atrial fibrillation ablation N/A 03/17/2012    Procedure: ATRIAL FIBRILLATION ABLATION;  Surgeon: Hillis Range, MD;  Location: [facility] CATH LAB;  Service: Cardiovascular;  Laterality: N/A;  . Atrial fibrillation ablation N/A 09/18/2012    Procedure: ATRIAL FIBRILLATION ABLATION;  Surgeon: Hillis Range, MD;  Location: [facility] CATH LAB;  Service: Cardiovascular;  Laterality: N/A;    Current Outpatient Prescriptions  Medication Sig Dispense Refill  . amiodarone (PACERONE) 200 MG tablet Take 200mg  twice daily until 09/23/14 then decrease to 200mg  once daily 60 tablet 3  . carvedilol (COREG) 3.125 MG tablet  Take 1 tablet (3.125 mg total) by mouth 2 (two) times daily with a meal. 60 tablet 11  . Febuxostat (ULORIC) 80 MG TABS Take 80 mg by mouth daily.    . fluticasone (FLONASE) 50 MCG/ACT nasal spray Place 2 sprays into both nostrils 2 (two) times daily.    . halobetasol (ULTRAVATE) 0.05 % cream Apply 1 application topically 2 (two) times daily as needed (FOR PSORIASIS).    Marland Kitchen ibuprofen (ADVIL,MOTRIN) 800 MG tablet Take 1,600 mg by mouth 2 (two) times daily as needed for moderate pain.    Marland Kitchen lisinopril (PRINIVIL,ZESTRIL) 10 MG tablet Take 1 tablet (10 mg total) by mouth 2 (two)  times daily. 30 tablet 0  . sertraline (ZOLOFT) 100 MG tablet Take 150 mg by mouth daily.    . simvastatin (ZOCOR) 20 MG tablet TAKE 1 TABLET BY MOUTH EVERY DAY 30 tablet 2  . tamsulosin (FLOMAX) 0.4 MG CAPS capsule Take 0.8 mg by mouth daily.     Marland Kitchen tolterodine (DETROL LA) 4 MG 24 hr capsule Take 4 mg by mouth daily.    . traZODone (DESYREL) 100 MG tablet Take 100 mg by mouth at bedtime.    Carlena Hurl 20 MG TABS tablet TAKE 1 TABLET BY MOUTH DAILY (Patient taking differently: TAKE 1 TABLET BY MOUTH AT BEDTIME) 30 tablet 3  . halobetasol (ULTRAVATE) 0.05 % cream Apply 1 application topically daily as needed (for psoriasis).      No current facility-administered medications for this visit.    Physical Exam: Filed Vitals:   09/06/14 1608  BP: 122/68  Pulse: 64  Height: 6' 2.5" (1.892 m)  Weight: 271 lb 6.4 oz (123.106 kg)    GEN- The patient is well appearing, alert and oriented x 3 today.   Head- normocephalic, atraumatic Eyes-  Sclera clear, conjunctiva pink Ears- hearing intact Oropharynx- clear Lungs- Clear to ausculation bilaterally, normal work of breathing Heart- Regular rate and rhythm, no murmurs, rubs or gallops, PMI not laterally displaced GI- soft, NT, ND, + BS Extremities- no clubbing, cyanosis, or edema  ekg today reveals sinus rhythm at 64 bpm, QTc 447 ms.  Assessment and Plan:  1.  Persistent afib  Referred to [name] for a maze procedure with afib ablation x 2 but had to cancel work up for procedure due to losing his job. He had done well on amiodarone recently until he was off x several weeks. In SR today, Continue Amiodarone 200 mg bid x 3 more weeks then reduce to 200 mg a day. Would like to consider maze in the future when time off for the procedure will not threaten his job.  Liver panel/TSH today. Continue xarelto- kidney function stable with bmet just checked in the ER 3/4Crcl 131.16ml/min  2. HTN Stable No change required today.   3. OSA Is being  compliant with CPAP   4. Nonischemic CM stable Consideration for repeat echo next visit.  5. Syncope No recurrence ILR will be helpful to evaluate for arrhythmia as the cause.  He declines monitor implantation at this time.  F/u with Dr. [name] in 2 months, sooner if needed.

## 2014-09-07 ENCOUNTER — Telehealth: Payer: Self-pay | Admitting: *Deleted

## 2014-09-07 LAB — HEPATIC FUNCTION PANEL
ALT: 22 U/L (ref 0–53)
AST: 21 U/L (ref 0–37)
Albumin: 4.5 g/dL (ref 3.5–5.2)
Alkaline Phosphatase: 59 U/L (ref 39–117)
BILIRUBIN DIRECT: 0.2 mg/dL (ref 0.0–0.3)
Total Bilirubin: 0.9 mg/dL (ref 0.2–1.2)
Total Protein: 7 g/dL (ref 6.0–8.3)

## 2014-09-07 LAB — TSH: TSH: 1.14 u[IU]/mL (ref 0.35–4.50)

## 2014-09-07 NOTE — Telephone Encounter (Signed)
Left message informing pt of normal lab results 

## 2014-09-07 NOTE — Telephone Encounter (Signed)
-----   Message from Newman Nip, NP sent at 09/07/2014  2:03 PM EST ----- Please inform normal results

## 2014-09-16 ENCOUNTER — Other Ambulatory Visit: Payer: Self-pay

## 2014-09-16 MED ORDER — LISINOPRIL 10 MG PO TABS: 10.0000 mg | ORAL_TABLET | Freq: Two times a day (BID) | ORAL | Status: DC

## 2014-09-24 ENCOUNTER — Other Ambulatory Visit: Payer: Self-pay | Admitting: Internal Medicine

## 2014-10-11 ENCOUNTER — Encounter: Payer: Self-pay | Admitting: Internal Medicine

## 2014-10-25 ENCOUNTER — Other Ambulatory Visit: Payer: Self-pay | Admitting: Internal Medicine

## 2014-10-28 ENCOUNTER — Other Ambulatory Visit: Payer: Self-pay | Admitting: Internal Medicine

## 2014-10-31 ENCOUNTER — Ambulatory Visit: Payer: BLUE CROSS/BLUE SHIELD | Admitting: Internal Medicine

## 2014-11-11 ENCOUNTER — Telehealth: Payer: Self-pay | Admitting: Internal Medicine

## 2014-11-11 DIAGNOSIS — I4819 Other persistent atrial fibrillation: Secondary | ICD-10-CM

## 2014-11-11 DIAGNOSIS — I480 Paroxysmal atrial fibrillation: Secondary | ICD-10-CM

## 2014-11-11 MED ORDER — AMIODARONE HCL 200 MG PO TABS: 200.0000 mg | ORAL_TABLET | Freq: Two times a day (BID) | ORAL | Status: DC

## 2014-11-11 NOTE — Telephone Encounter (Signed)
Talked with patient - hes back in NSR now but wants to continue taking amiodarone 200 BID to prevent further afib. - per Rudi Coco, NP that is permissible to do. Will call in prescription for amiodarone 200 BID and patient has appointment set up for Wednesday May 18th.

## 2014-11-11 NOTE — Telephone Encounter (Signed)
Spoke with patient who reports he has been going in and out of At Fib since Sunday.  Reports his HR is between 70 and 90 bpm but he feels horrible when he's in At Fib.  He has restarted Amiodarone 200 mg BID and is now back in NSR based on how he feels.  He wants to know if he should continue on BID dosing (will need RX sent into [facility]) and what to do if he goes back into At Fib.  Aware I will forward information to Rudi Coco for review and orders.

## 2014-11-11 NOTE — Telephone Encounter (Signed)
New Message  Pt feels he was in A-fib and is now out. Pt wanted to speak w/ Rn about waht to do/medications. Please call back and discuss.

## 2014-11-11 NOTE — Telephone Encounter (Signed)
LMOM called and left message for pt to return call to clinc. Can stay on amiodarone 200 mg bid, can take  over the weekend and will call in rx, would like to see him in f/u early next week.

## 2014-11-16 ENCOUNTER — Inpatient Hospital Stay (HOSPITAL_COMMUNITY)
Admission: RE | Admit: 2014-11-16 | Payer: BLUE CROSS/BLUE SHIELD | Source: Ambulatory Visit | Admitting: Nurse Practitioner

## 2014-12-07 ENCOUNTER — Ambulatory Visit (INDEPENDENT_AMBULATORY_CARE_PROVIDER_SITE_OTHER): Payer: BLUE CROSS/BLUE SHIELD | Admitting: Internal Medicine

## 2014-12-07 ENCOUNTER — Encounter: Payer: Self-pay | Admitting: Internal Medicine

## 2014-12-07 VITALS — BP 112/68 | HR 52 | Ht 74.5 in | Wt 283.2 lb

## 2014-12-07 DIAGNOSIS — I10 Essential (primary) hypertension: Secondary | ICD-10-CM

## 2014-12-07 DIAGNOSIS — I481 Persistent atrial fibrillation: Secondary | ICD-10-CM

## 2014-12-07 DIAGNOSIS — I4819 Other persistent atrial fibrillation: Secondary | ICD-10-CM

## 2014-12-07 DIAGNOSIS — G4733 Obstructive sleep apnea (adult) (pediatric): Secondary | ICD-10-CM

## 2014-12-07 DIAGNOSIS — I480 Paroxysmal atrial fibrillation: Secondary | ICD-10-CM

## 2014-12-07 MED ORDER — AMIODARONE HCL 200 MG PO TABS: 200.0000 mg | ORAL_TABLET | Freq: Every day | ORAL | Status: DC

## 2014-12-07 NOTE — Patient Instructions (Signed)
Medication Instructions:  Your physician recommends that you continue on your current medications as directed. Please refer to the Current Medication list given to you today.   Labwork: None ordered  Testing/Procedures: None ordered  Follow-Up: Your physician recommends that you schedule a follow-up appointment in: 3 months with Donna Carroll, NP    Any Other Special Instructions Will Be Listed Below (If Applicable).   

## 2014-12-07 NOTE — Progress Notes (Signed)
PCP: Becky Berberian is a 54 y.o. male who presents today for electrophysiology follow up with h/o afib ablation x 2, last procedure 08/2012.  He has been seen by Dr Cornelius Moras and MAZE advised.  He did not have this done as he lost his job/ insurance.  He now has a new job but unfortunately fell recently and had several fractures which have had him out of work for several months.  He continues to have frequent afib.  During afib he has palpitations.   His palpitations are accompanied by fatigue.  Mild shortness of breath, no  lower extremity edema, dizziness, presyncope, or syncope.   The patient is otherwise without complaint today.   Past Medical History  Diagnosis Date  . Paroxysmal atrial fibrillation   . TIA (transient ischemic attack)   . Hypercholesteremia   . Thrombocytopenia   . Hypertension   . Obesity   . Depression   . Gout   . Hives   . GERD (gastroesophageal reflux disease)   . Cirrhosis     liver scarring on biopsy, presumed to be due to methotrexate  . Psoriasis   . OSA (obstructive sleep apnea)     mild, uses CPAP  . CHF (congestive heart failure)   . DJD (degenerative joint disease)   . Syncope 02/21/2014  . Persistent atrial fibrillation 03/02/2012  . Essential hypertension 08/30/2013  . Chronic combined systolic and diastolic CHF (congestive heart failure)    Past Surgical History  Procedure Laterality Date  . Cholecystectomy  2011  . Nose surgery      Turbinates  . Reconstruction of nose    . Tee without cardioversion  03/16/2012    Procedure: TRANSESOPHAGEAL ECHOCARDIOGRAM (TEE);  Surgeon: [name], MD;  Location: [facility] ENDOSCOPY;  Service: Cardiovascular;  Laterality: N/A;  . Atrial fibrillation ablation  03/17/12    PVI and CTI ablation by Dr [name]  . Tendon repair      Right Forearm  . Tee without cardioversion N/A 09/17/2012    Procedure: TRANSESOPHAGEAL ECHOCARDIOGRAM (TEE);  Surgeon: Lewayne Bunting, MD;  Location: [facility] ENDOSCOPY;  Service:  Cardiovascular;  Laterality: N/A;  . Ablation of dysrhythmic focus  09/17/2012  . Cardioversion N/A 10/09/2012    Procedure: CARDIOVERSION;  Surgeon: Laurey Morale, MD;  Location: [facility] ENDOSCOPY;  Service: Cardiovascular;  Laterality: N/A;  . Cardioversion N/A 02/22/2014    Procedure: CARDIOVERSION;  Surgeon: Quintella Reichert, MD;  Location: [facility] ENDOSCOPY;  Service: Cardiovascular;  Laterality: N/A;  . Atrial fibrillation ablation N/A 03/17/2012    Procedure: ATRIAL FIBRILLATION ABLATION;  Surgeon: Hillis Range, MD;  Location: [facility] CATH LAB;  Service: Cardiovascular;  Laterality: N/A;  . Atrial fibrillation ablation N/A 09/18/2012    Procedure: ATRIAL FIBRILLATION ABLATION;  Surgeon: Hillis Range, MD;  Location: [facility] CATH LAB;  Service: Cardiovascular;  Laterality: N/A;    Current Outpatient Prescriptions  Medication Sig Dispense Refill  . amiodarone (PACERONE) 200 MG tablet Take 1 tablet (200 mg total) by mouth daily. 60 tablet 3  . carvedilol (COREG) 3.125 MG tablet Take 1 tablet (3.125 mg total) by mouth 2 (two) times daily with a meal. 60 tablet 11  . halobetasol (ULTRAVATE) 0.05 % cream Apply 1 application topically daily as needed (for psoriasis).     . halobetasol (ULTRAVATE) 0.05 % cream Apply 1 application topically 2 (two) times daily as needed (FOR PSORIASIS).    Marland Kitchen ibuprofen (ADVIL,MOTRIN) 800 MG tablet Take 1,600 mg by mouth 2 (  two) times daily as needed for moderate pain.    Marland Kitchen lisinopril (PRINIVIL,ZESTRIL) 10 MG tablet Take 1 tablet (10 mg total) by mouth 2 (two) times daily. 30 tablet 6  . sertraline (ZOLOFT) 100 MG tablet Take 150 mg by mouth daily.    . simvastatin (ZOCOR) 20 MG tablet TAKE 1 TABLET BY MOUTH EVERY DAY 30 tablet 5  . XARELTO 20 MG TABS tablet TAKE 1 TABLET BY MOUTH DAILY 30 tablet 0  . zolpidem (AMBIEN) 10 MG tablet TK 1 T PO QHS  0   No current facility-administered medications for this visit.    Physical Exam: Filed Vitals:   12/07/14 0932  BP: 112/68  Pulse: 52   Height: 6' 2.5" (1.892 m)  Weight: 128.459 kg (283 lb 3.2 oz)    GEN- The patient is well appearing, alert and oriented x 3 today.   Head- normocephalic, atraumatic Eyes-  Sclera clear, conjunctiva pink Ears- hearing intact Oropharynx- clear Lungs- Clear to ausculation bilaterally, normal work of breathing Heart- regular rate and rhythm, no murmurs, rubs or gallops, PMI not laterally displaced GI- soft, NT, ND, + BS Extremities- no clubbing, cyanosis, or edema  ekg today reveals sinus bradycardia 52 bpm, otherwise normal ekg  Assessment and Plan:  1.  Persistent afib He continues to have episodic afib despite ablation x 2.   He has failed medical therapy with amiodarone.  Today, we discussed surgical maze. He may reconsider this in the future.  For now, he wishes to continue amiodarone.  Labs from 3/16 are reviewed.  These will need to be repeated in the AF clinic in 3 months.  Will also need PFTs at that time. Continue amiodarone and anticoagulation  2. HTN Stable No change required today.   3. OSA Compliance with CPAP advised  4. Nonischemic CM stable  5. Syncope No recurrence ILR will be helpful to evaluate for arrhythmia as the cause.  He declines monitor implantation at this time.  Follow-up with Rudi Coco NP in the AF clinic every 3 months going forward

## 2014-12-16 ENCOUNTER — Other Ambulatory Visit: Payer: Self-pay | Admitting: Internal Medicine

## 2014-12-23 ENCOUNTER — Other Ambulatory Visit: Payer: Self-pay

## 2014-12-23 DIAGNOSIS — I480 Paroxysmal atrial fibrillation: Secondary | ICD-10-CM

## 2014-12-23 DIAGNOSIS — I4819 Other persistent atrial fibrillation: Secondary | ICD-10-CM

## 2014-12-23 DIAGNOSIS — I10 Essential (primary) hypertension: Secondary | ICD-10-CM

## 2014-12-23 DIAGNOSIS — R001 Bradycardia, unspecified: Secondary | ICD-10-CM

## 2014-12-23 MED ORDER — SIMVASTATIN 20 MG PO TABS: 20.0000 mg | ORAL_TABLET | Freq: Every day | ORAL | Status: DC

## 2014-12-23 MED ORDER — AMIODARONE HCL 200 MG PO TABS: 200.0000 mg | ORAL_TABLET | Freq: Every day | ORAL | Status: DC

## 2014-12-23 MED ORDER — CARVEDILOL 3.125 MG PO TABS: 3.1250 mg | ORAL_TABLET | Freq: Two times a day (BID) | ORAL | Status: DC

## 2014-12-30 ENCOUNTER — Other Ambulatory Visit: Payer: Self-pay

## 2014-12-30 MED ORDER — LISINOPRIL 10 MG PO TABS: 10.0000 mg | ORAL_TABLET | Freq: Two times a day (BID) | ORAL | Status: DC

## 2015-01-01 ENCOUNTER — Other Ambulatory Visit: Payer: Self-pay | Admitting: Internal Medicine

## 2015-01-16 ENCOUNTER — Other Ambulatory Visit: Payer: Self-pay | Admitting: Internal Medicine

## 2015-01-19 ENCOUNTER — Telehealth: Payer: Self-pay | Admitting: Internal Medicine

## 2015-01-19 MED ORDER — LISINOPRIL 20 MG PO TABS: 20.0000 mg | ORAL_TABLET | Freq: Two times a day (BID) | ORAL | Status: DC

## 2015-01-19 NOTE — Telephone Encounter (Signed)
New message   Pt c/o BP issue: STAT if pt c/o blurred vision, one-sided weakness or slurred speech  1. What are your last 5 BP readings? 158/77 today @ 5:30 laying down; 158/77 today @ 5:30 sitting down  2. Are you having any other symptoms (ex. Dizziness, headache, blurred vision, passed out)? Blurred vision bad last night, not right now  3. What is your BP issue? B/p running a little higher than normal  Please call to discuss

## 2015-01-19 NOTE — Telephone Encounter (Signed)
He has not been taking the correct amount of Lisinopril for some time.  I ma not sure why it was called in as 10 mg bid as apposed to 20 mg bid    He says he had too much salt on Mon also.  I let him know I would correct his rx and send in

## 2015-01-19 NOTE — Telephone Encounter (Signed)
Spoke with patient, his BP is up since Mon.  He says he felt like it was high and he checked it

## 2015-02-06 ENCOUNTER — Other Ambulatory Visit: Payer: Self-pay

## 2015-02-06 DIAGNOSIS — I4819 Other persistent atrial fibrillation: Secondary | ICD-10-CM

## 2015-02-06 DIAGNOSIS — I480 Paroxysmal atrial fibrillation: Secondary | ICD-10-CM

## 2015-02-06 MED ORDER — AMIODARONE HCL 200 MG PO TABS: 200.0000 mg | ORAL_TABLET | Freq: Every day | ORAL | Status: DC

## 2015-03-08 ENCOUNTER — Ambulatory Visit (HOSPITAL_COMMUNITY)
Admission: RE | Admit: 2015-03-08 | Discharge: 2015-03-08 | Disposition: A | Discharge status: Home / Self Care | Payer: BLUE CROSS/BLUE SHIELD | Source: Ambulatory Visit | Attending: Nurse Practitioner | Admitting: Nurse Practitioner

## 2015-03-08 ENCOUNTER — Encounter (HOSPITAL_COMMUNITY): Payer: Self-pay | Admitting: Nurse Practitioner

## 2015-03-08 VITALS — BP 120/76 | HR 57 | Ht 74.0 in | Wt 300.2 lb

## 2015-03-08 DIAGNOSIS — I10 Essential (primary) hypertension: Secondary | ICD-10-CM | POA: Diagnosis not present

## 2015-03-08 DIAGNOSIS — I429 Cardiomyopathy, unspecified: Secondary | ICD-10-CM | POA: Diagnosis not present

## 2015-03-08 DIAGNOSIS — I4891 Unspecified atrial fibrillation: Secondary | ICD-10-CM | POA: Insufficient documentation

## 2015-03-08 DIAGNOSIS — I480 Paroxysmal atrial fibrillation: Secondary | ICD-10-CM | POA: Diagnosis not present

## 2015-03-08 LAB — HEPATIC FUNCTION PANEL
ALBUMIN: 3.8 g/dL (ref 3.5–5.0)
ALT: 30 U/L (ref 17–63)
AST: 21 U/L (ref 15–41)
Alkaline Phosphatase: 60 U/L (ref 38–126)
BILIRUBIN DIRECT: 0.1 mg/dL (ref 0.1–0.5)
Indirect Bilirubin: 1 mg/dL — ABNORMAL HIGH (ref 0.3–0.9)
Total Bilirubin: 1.1 mg/dL (ref 0.3–1.2)
Total Protein: 6.3 g/dL — ABNORMAL LOW (ref 6.5–8.1)

## 2015-03-08 LAB — TSH: TSH: 1.523 u[IU]/mL (ref 0.350–4.500)

## 2015-03-08 NOTE — Progress Notes (Signed)
Patient ID: Joseph Garrett, male   DOB: 12-Sep-1960, 54 y.o.   MRN: [id number]     Primary Care Physician: Verl Bangs, MD Referring Physician: Dr. Donne [name] is a 54 y.o. male with a h/o persistent afib s/p abaltion x 2 on amiodarone. He reports small amt of afib burden He did have a work related injury and is still recovering, but is back at work. He is taking ibuprofen for the injuries and was cautioned against bleeding and asked to take least amount effective dose. He will need PFT/ labs for ongoing amiodarone use.  Today, he denies symptoms of palpitations, chest pain, shortness of breath, orthopnea, PND, lower extremity edema, dizziness, presyncope, syncope, or neurologic sequela. The patient is tolerating medications without difficulties and is otherwise without complaint today.   Past Medical History  Diagnosis Date  . Paroxysmal atrial fibrillation   . TIA (transient ischemic attack)   . Hypercholesteremia   . Thrombocytopenia   . Hypertension   . Obesity   . Depression   . Gout   . Hives   . GERD (gastroesophageal reflux disease)   . Cirrhosis     liver scarring on biopsy, presumed to be due to methotrexate  . Psoriasis   . OSA (obstructive sleep apnea)     mild, uses CPAP  . CHF (congestive heart failure)   . DJD (degenerative joint disease)   . Syncope 02/21/2014  . Persistent atrial fibrillation 03/02/2012  . Essential hypertension 08/30/2013  . Chronic combined systolic and diastolic CHF (congestive heart failure)    Past Surgical History  Procedure Laterality Date  . Cholecystectomy  2011  . Nose surgery      Turbinates  . Reconstruction of nose    . Tee without cardioversion  03/16/2012    Procedure: TRANSESOPHAGEAL ECHOCARDIOGRAM (TEE);  Surgeon: [name], MD;  Location: [facility] ENDOSCOPY;  Service: Cardiovascular;  Laterality: N/A;  . Atrial fibrillation ablation  03/17/12    PVI and CTI ablation by Dr [name]  . Tendon repair      Right  Forearm  . Tee without cardioversion N/A 09/17/2012    Procedure: TRANSESOPHAGEAL ECHOCARDIOGRAM (TEE);  Surgeon: Lewayne Bunting, MD;  Location: [facility] ENDOSCOPY;  Service: Cardiovascular;  Laterality: N/A;  . Ablation of dysrhythmic focus  09/17/2012  . Cardioversion N/A 10/09/2012    Procedure: CARDIOVERSION;  Surgeon: Laurey Morale, MD;  Location: [facility] ENDOSCOPY;  Service: Cardiovascular;  Laterality: N/A;  . Cardioversion N/A 02/22/2014    Procedure: CARDIOVERSION;  Surgeon: Quintella Reichert, MD;  Location: [facility] ENDOSCOPY;  Service: Cardiovascular;  Laterality: N/A;  . Atrial fibrillation ablation N/A 03/17/2012    Procedure: ATRIAL FIBRILLATION ABLATION;  Surgeon: Hillis Range, MD;  Location: [facility] CATH LAB;  Service: Cardiovascular;  Laterality: N/A;  . Atrial fibrillation ablation N/A 09/18/2012    Procedure: ATRIAL FIBRILLATION ABLATION;  Surgeon: Hillis Range, MD;  Location: [facility] CATH LAB;  Service: Cardiovascular;  Laterality: N/A;    Current Outpatient Prescriptions  Medication Sig Dispense Refill  . amiodarone (PACERONE) 200 MG tablet Take 1 tablet (200 mg total) by mouth daily. 30 tablet 1  . carvedilol (COREG) 3.125 MG tablet Take 1 tablet (3.125 mg total) by mouth 2 (two) times daily with a meal. 60 tablet 11  . ibuprofen (ADVIL,MOTRIN) 800 MG tablet Take 1,600 mg by mouth 2 (two) times daily as needed for moderate pain.    Marland Kitchen lisinopril (PRINIVIL,ZESTRIL) 20 MG tablet Take 1 tablet (20 mg total)  by mouth 2 (two) times daily. 60 tablet 11  . simvastatin (ZOCOR) 20 MG tablet Take 1 tablet (20 mg total) by mouth daily. 90 tablet 1  . XARELTO 20 MG TABS tablet TAKE 1 TABLET BY MOUTH EVERY DAY 30 tablet 11  . zolpidem (AMBIEN) 10 MG tablet TK 1 T PO QHS  0  . halobetasol (ULTRAVATE) 0.05 % cream Apply 1 application topically daily as needed (for psoriasis).     . halobetasol (ULTRAVATE) 0.05 % cream Apply 1 application topically 2 (two) times daily as needed (FOR PSORIASIS).    Marland Kitchen sertraline  (ZOLOFT) 100 MG tablet Take 200 mg by mouth daily.      No current facility-administered medications for this encounter.    Allergies  Allergen Reactions  . Penicillins Rash    Social History   Social History  . Marital Status: Married    Spouse Name: N/A  . Number of Children: N/A  . Years of Education: N/A   Occupational History  . Not on file.   Social History Main Topics  . Smoking status: Never Smoker   . Smokeless tobacco: Never Used  . Alcohol Use: No  . Drug Use: No  . Sexual Activity: Not on file   Other Topics Concern  . Not on file   Social History Narrative   Lives in [place] with spouse.  3 children are healthy.     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    Family History  Problem Relation Age of Onset  . Heart disease    . Diabetes    . Heart attack Father     ROS- All systems are reviewed and negative except as per the HPI above  Physical Exam: Filed Vitals:   03/08/15 0912  BP: 120/76  Pulse: 57  Height:  (1.88 m)  Weight: 300 lb 3.2 oz (136.17 kg)    GEN- The patient is well appearing, alert and oriented x 3 today.   Head- normocephalic, atraumatic Eyes-  Sclera clear, conjunctiva pink Ears- hearing intact Oropharynx- clear Neck- supple, no JVP Lymph- no cervical lymphadenopathy Lungs- Clear to ausculation bilaterally, normal work of breathing Heart- Regular rate and rhythm, no murmurs, rubs or gallops, PMI not laterally displaced GI- soft, NT, ND, + BS Extremities- no clubbing, cyanosis, or edema MS- no significant deformity or atrophy Skin- no rash or lesion Psych- euthymic mood, full affect Neuro- strength and sensation are intact  EKG- sinus brady with one  PVC, Pr int 180 ms, QRS 88 ms, QTc 422 ms.  Epic records reviewed  Assessment and Plan: 1.afib He is satisfied with current afib burden Continue amiodarone Continue anticoagulation Cautioned re NSAIDS and increased bleeding risk TSH/liver  today PFT's scheduled  2. HTN Stable  3. Nonischemic CM Stable  F/u in afib clinic in 3 months

## 2015-03-08 NOTE — Patient Instructions (Signed)
Your physician has recommended that you have a pulmonary function test. Pulmonary Function Tests are a group of tests that measure how well air moves in and out of your lungs.  Scheduled for 9/13 @ 1pm . Go to [facility] main entrance and register with admitting.  No smoking, caffeine, inhalers 4 hours prior to testing.  May eat/drink prior too.

## 2015-03-14 ENCOUNTER — Ambulatory Visit (HOSPITAL_COMMUNITY)
Admission: RE | Admit: 2015-03-14 | Discharge: 2015-03-14 | Disposition: A | Discharge status: Home / Self Care | Payer: BLUE CROSS/BLUE SHIELD | Source: Ambulatory Visit | Attending: Nurse Practitioner | Admitting: Nurse Practitioner

## 2015-03-14 DIAGNOSIS — I480 Paroxysmal atrial fibrillation: Secondary | ICD-10-CM | POA: Diagnosis present

## 2015-03-14 LAB — PULMONARY FUNCTION TEST
DL/VA % pred: 87 %
DL/VA: 4.22 ml/min/mmHg/L
DLCO UNC % PRED: 66 %
DLCO UNC: 24.92 ml/min/mmHg
FEF 25-75 PRE: 2.94 L/s
FEF 25-75 Post: 3.23 L/sec
FEF2575-%Change-Post: 9 %
FEF2575-%Pred-Post: 90 %
FEF2575-%Pred-Pre: 82 %
FEV1-%Change-Post: 6 %
FEV1-%PRED-POST: 77 %
FEV1-%Pred-Pre: 72 %
FEV1-POST: 3.31 L
FEV1-Pre: 3.11 L
FEV1FVC-%Change-Post: 8 %
FEV1FVC-%Pred-Pre: 95 %
FEV6-%CHANGE-POST: 1 %
FEV6-%PRED-POST: 77 %
FEV6-%PRED-PRE: 75 %
FEV6-PRE: 4.06 L
FEV6-Post: 4.14 L
FEV6FVC-%CHANGE-POST: 0 %
FEV6FVC-%PRED-POST: 104 %
FEV6FVC-%PRED-PRE: 103 %
FVC-%CHANGE-POST: -1 %
FVC-%Pred-Post: 74 %
FVC-%Pred-Pre: 75 %
FVC-Post: 4.14 L
FVC-Pre: 4.22 L
POST FEV6/FVC RATIO: 100 %
Post FEV1/FVC ratio: 80 %
Pre FEV1/FVC ratio: 74 %
Pre FEV6/FVC Ratio: 100 %
RV % PRED: 75 %
RV: 1.8 L
TLC % pred: 79 %
TLC: 6.19 L

## 2015-03-14 MED ORDER — ALBUTEROL SULFATE (2.5 MG/3ML) 0.083% IN NEBU
2.5000 mg | INHALATION_SOLUTION | Freq: Once | RESPIRATORY_TRACT | Status: AC
  Administered 2015-03-14: 2.5 mg via RESPIRATORY_TRACT

## 2015-04-01 HISTORY — PX: EYE SURGERY: SHX253

## 2015-04-05 ENCOUNTER — Other Ambulatory Visit: Payer: Self-pay | Admitting: Internal Medicine

## 2015-04-05 MED ORDER — LISINOPRIL 20 MG PO TABS: 20.0000 mg | ORAL_TABLET | Freq: Two times a day (BID) | ORAL | Status: DC

## 2015-04-06 ENCOUNTER — Encounter (HOSPITAL_COMMUNITY): Payer: Self-pay | Admitting: *Deleted

## 2015-04-28 ENCOUNTER — Institutional Professional Consult (permissible substitution): Payer: BLUE CROSS/BLUE SHIELD | Admitting: Pulmonary Disease

## 2015-04-28 ENCOUNTER — Ambulatory Visit (INDEPENDENT_AMBULATORY_CARE_PROVIDER_SITE_OTHER): Payer: BLUE CROSS/BLUE SHIELD | Admitting: Pulmonary Disease

## 2015-04-28 ENCOUNTER — Encounter: Payer: Self-pay | Admitting: Pulmonary Disease

## 2015-04-28 VITALS — BP 130/86 | HR 63 | Ht 74.0 in | Wt 296.8 lb

## 2015-04-28 DIAGNOSIS — Z23 Encounter for immunization: Secondary | ICD-10-CM | POA: Diagnosis not present

## 2015-04-28 DIAGNOSIS — R942 Abnormal results of pulmonary function studies: Secondary | ICD-10-CM | POA: Insufficient documentation

## 2015-04-28 DIAGNOSIS — I10 Essential (primary) hypertension: Secondary | ICD-10-CM

## 2015-04-28 NOTE — Patient Instructions (Addendum)
Obtain PFTs from [facility] Diffusion is slight decreased at 66% Repeat breathing test in with FU visit Lisinopril can cause cough - clarify dose with cardiology

## 2015-04-28 NOTE — Assessment & Plan Note (Addendum)
Diffusion capacity is slightly decreased at 66% but does correct for alveolar volume to total lung capacity is at lower limit of normal it is unclear to me whether this restrictive parents simply represents obesity or there is some element of amiodarone toxicity .he does have a cough -but he is convinced that this is due to allergies and has improved .   we will try to Obtain PFTs from [facility] and see if we can establish a trend . Repeatspirometry with DLCO in with FU visit- if DLCO continues to drop then we may have to consider stopping amiodarone. I hope that this would not be necessary

## 2015-04-28 NOTE — Progress Notes (Signed)
Subjective:    Patient ID: Joseph Garrett, male    DOB: 12-14-1960, 54 y.o.   MRN: [id number]  HPI  Chief Complaint  Patient presents with  . Pulmonary Consult    Referred by Dr. Hal Hope, [place]; Pt has taken Amiodarone for many years, concerned it is affecting the lungs; DOE.   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 He has chronic atrial fibrillation, has failed cardioversion in the past and ablation 2. He has been maintained on amiodarone and anticoagulation for many years, but continues to have breakthrough episodes. Surgical MAZE procedure is being considered-he was evaluated but then deferred since he lost his insurance and then had some work-related injuries. He does have OSA and reports compliance with his CPAP machine-this is managed by his PCP in [place]. He has psoriasis and was on methotrexate at some point, and there is mention of cirrhosis related to this drug. He is abstaining from alcohol. He just underwent cataract surgery   PFTs 03/2015 showed no evidence of airway obstruction, FEV1 was 72%, TLC was 79%, DLCO was decreased at 66%-24.9 but corrected for alveolar volume. Chest x-ray 08/2014 was normal TSH has been normal    he reports a productive cough for one month which she attributes to allergies and states that is getting better He has difficult to control hypertension-maintained on lisinopril and Coreg, he is confused as to which medication he was supposed to increase-is now taking 40 mg twice a day of lisinopril.   Past Medical History  Diagnosis Date  . Paroxysmal atrial fibrillation (HCC)   . TIA (transient ischemic attack)   . Hypercholesteremia   . Thrombocytopenia (HCC)   . Hypertension   . Obesity   . Depression   . Gout   . Hives   . GERD (gastroesophageal reflux disease)   . Cirrhosis (HCC)     liver scarring on biopsy, presumed to be due to methotrexate  . Psoriasis   . OSA  (obstructive sleep apnea)     mild, uses CPAP  . CHF (congestive heart failure) (HCC)   . DJD (degenerative joint disease)   . Syncope 02/21/2014  . Persistent atrial fibrillation (HCC) 03/02/2012  . Essential hypertension 08/30/2013  . Chronic combined systolic and diastolic CHF (congestive heart failure) [facility])    Past Surgical History  Procedure Laterality Date  . Cholecystectomy  2011  . Nose surgery      Turbinates  . Reconstruction of nose    . Tee without cardioversion  03/16/2012    Procedure: TRANSESOPHAGEAL ECHOCARDIOGRAM (TEE);  Surgeon: [name], MD;  Location: [facility] ENDOSCOPY;  Service: Cardiovascular;  Laterality: N/A;  . Atrial fibrillation ablation  03/17/12    PVI and CTI ablation by Dr [name]  . Tendon repair      Right Forearm  . Tee without cardioversion N/A 09/17/2012    Procedure: TRANSESOPHAGEAL ECHOCARDIOGRAM (TEE);  Surgeon: Lewayne Bunting, MD;  Location: [facility] ENDOSCOPY;  Service: Cardiovascular;  Laterality: N/A;  . Ablation of dysrhythmic focus  09/17/2012  . Cardioversion N/A 10/09/2012    Procedure: CARDIOVERSION;  Surgeon: Laurey Morale, MD;  Location: [facility] ENDOSCOPY;  Service: Cardiovascular;  Laterality: N/A;  . Cardioversion N/A 02/22/2014    Procedure: CARDIOVERSION;  Surgeon: Quintella Reichert, MD;  Location: [facility] ENDOSCOPY;  Service: Cardiovascular;  Laterality: N/A;  . Atrial fibrillation ablation N/A 03/17/2012    Procedure: ATRIAL FIBRILLATION ABLATION;  Surgeon: Hillis Range, MD;  Location: [facility] CATH LAB;  Service: Cardiovascular;  Laterality: N/A;  . Atrial fibrillation ablation N/A 09/18/2012    Procedure: ATRIAL FIBRILLATION ABLATION;  Surgeon: Hillis Range, MD;  Location: [facility] CATH LAB;  Service: Cardiovascular;  Laterality: N/A;    Allergies  Allergen Reactions  . Penicillins Rash    Social History   Social History  . Marital Status: Married    Spouse Name: N/A  . Number of Children: N/A  . Years of Education: N/A   Occupational History  . Not on  file.   Social History Main Topics  . Smoking status: Never Smoker   . Smokeless tobacco: Never Used  . Alcohol Use: No  . Drug Use: No  . Sexual Activity: Not on file   Other Topics Concern  . Not on file   Social History Narrative   Lives in [place] with spouse.  3 children are healthy.     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    Family History  Problem Relation Age of Onset  . Heart disease    . Diabetes    . Heart attack Father      Review of Systems  Constitutional: Negative for fever, chills, activity change, appetite change and unexpected weight change.  HENT: Negative for congestion, dental problem, postnasal drip, rhinorrhea, sneezing, sore throat, trouble swallowing and voice change.   Eyes: Negative for visual disturbance.  Respiratory: Positive for shortness of breath. Negative for cough and choking.   Cardiovascular: Negative for chest pain and leg swelling.  Gastrointestinal: Negative for nausea, vomiting and abdominal pain.  Genitourinary: Negative for difficulty urinating.  Musculoskeletal: Negative for arthralgias.  Skin: Negative for rash.  Psychiatric/Behavioral: Negative for behavioral problems and confusion.       Objective:   Physical Exam  Gen. Pleasant, obese, in no distress, normal affect ENT - no lesions, no post nasal drip, class 2 airway, red rt eye Neck: No JVD, no thyromegaly, no carotid bruits Lungs: no use of accessory muscles, no dullness to percussion, decreased without rales or rhonchi  Cardiovascular: Rhythm regular, heart sounds  normal, no murmurs or gallops, no peripheral edema Abdomen: soft and non-tender, no hepatosplenomegaly, BS normal. Musculoskeletal: No deformities, no cyanosis or clubbing Neuro:  alert, non focal, no tremors       Assessment & Plan:

## 2015-04-28 NOTE — Assessment & Plan Note (Signed)
BP high - may be relate to cataract surgery - he will recheck Clarify dose of lisinopril  From cardiology

## 2015-05-03 ENCOUNTER — Other Ambulatory Visit: Payer: Self-pay | Admitting: Internal Medicine

## 2015-05-13 ENCOUNTER — Telehealth: Payer: Self-pay | Admitting: Pulmonary Disease

## 2015-05-13 NOTE — Telephone Encounter (Signed)
Reviewed PFT from [place] 04/2011 DLCO was 78% , has dropped to 69% now

## 2015-06-07 ENCOUNTER — Ambulatory Visit (HOSPITAL_COMMUNITY): Payer: BLUE CROSS/BLUE SHIELD | Admitting: Nurse Practitioner

## 2015-06-09 ENCOUNTER — Encounter (HOSPITAL_COMMUNITY): Payer: Self-pay

## 2015-08-29 ENCOUNTER — Ambulatory Visit: Payer: BLUE CROSS/BLUE SHIELD | Admitting: Pulmonary Disease

## 2015-10-30 ENCOUNTER — Other Ambulatory Visit: Payer: Self-pay | Admitting: Internal Medicine

## 2015-11-05 ENCOUNTER — Encounter (HOSPITAL_COMMUNITY): Payer: Self-pay | Admitting: Emergency Medicine

## 2015-11-05 ENCOUNTER — Emergency Department (HOSPITAL_COMMUNITY)
Admission: EM | Admit: 2015-11-05 | Discharge: 2015-11-05 | Disposition: A | Discharge status: Home / Self Care | Payer: BLUE CROSS/BLUE SHIELD | Attending: Emergency Medicine | Admitting: Emergency Medicine

## 2015-11-05 DIAGNOSIS — Z8739 Personal history of other diseases of the musculoskeletal system and connective tissue: Secondary | ICD-10-CM | POA: Diagnosis not present

## 2015-11-05 DIAGNOSIS — Z8719 Personal history of other diseases of the digestive system: Secondary | ICD-10-CM | POA: Diagnosis not present

## 2015-11-05 DIAGNOSIS — I480 Paroxysmal atrial fibrillation: Secondary | ICD-10-CM | POA: Diagnosis not present

## 2015-11-05 DIAGNOSIS — Z872 Personal history of diseases of the skin and subcutaneous tissue: Secondary | ICD-10-CM | POA: Diagnosis not present

## 2015-11-05 DIAGNOSIS — I10 Essential (primary) hypertension: Secondary | ICD-10-CM | POA: Insufficient documentation

## 2015-11-05 DIAGNOSIS — E669 Obesity, unspecified: Secondary | ICD-10-CM | POA: Insufficient documentation

## 2015-11-05 DIAGNOSIS — I4891 Unspecified atrial fibrillation: Secondary | ICD-10-CM | POA: Diagnosis present

## 2015-11-05 DIAGNOSIS — E7800 Pure hypercholesterolemia, unspecified: Secondary | ICD-10-CM | POA: Insufficient documentation

## 2015-11-05 DIAGNOSIS — Z880 Allergy status to penicillin: Secondary | ICD-10-CM | POA: Diagnosis not present

## 2015-11-05 DIAGNOSIS — F329 Major depressive disorder, single episode, unspecified: Secondary | ICD-10-CM | POA: Insufficient documentation

## 2015-11-05 DIAGNOSIS — I4819 Other persistent atrial fibrillation: Secondary | ICD-10-CM

## 2015-11-05 DIAGNOSIS — Z9981 Dependence on supplemental oxygen: Secondary | ICD-10-CM | POA: Diagnosis not present

## 2015-11-05 DIAGNOSIS — Z79899 Other long term (current) drug therapy: Secondary | ICD-10-CM | POA: Insufficient documentation

## 2015-11-05 DIAGNOSIS — Z7901 Long term (current) use of anticoagulants: Secondary | ICD-10-CM | POA: Diagnosis not present

## 2015-11-05 DIAGNOSIS — I481 Persistent atrial fibrillation: Secondary | ICD-10-CM

## 2015-11-05 DIAGNOSIS — Z8673 Personal history of transient ischemic attack (TIA), and cerebral infarction without residual deficits: Secondary | ICD-10-CM | POA: Diagnosis not present

## 2015-11-05 DIAGNOSIS — Z862 Personal history of diseases of the blood and blood-forming organs and certain disorders involving the immune mechanism: Secondary | ICD-10-CM | POA: Diagnosis not present

## 2015-11-05 DIAGNOSIS — G4733 Obstructive sleep apnea (adult) (pediatric): Secondary | ICD-10-CM | POA: Diagnosis not present

## 2015-11-05 DIAGNOSIS — I5042 Chronic combined systolic (congestive) and diastolic (congestive) heart failure: Secondary | ICD-10-CM | POA: Insufficient documentation

## 2015-11-05 LAB — CBC WITH DIFFERENTIAL/PLATELET
BASOS ABS: 0 10*3/uL (ref 0.0–0.1)
Basophils Relative: 0 %
Eosinophils Absolute: 0 10*3/uL (ref 0.0–0.7)
Eosinophils Relative: 0 %
HEMATOCRIT: 40.9 % (ref 39.0–52.0)
Hemoglobin: 13.2 g/dL (ref 13.0–17.0)
LYMPHS ABS: 0.9 10*3/uL (ref 0.7–4.0)
LYMPHS PCT: 7 %
MCH: 30.8 pg (ref 26.0–34.0)
MCHC: 32.3 g/dL (ref 30.0–36.0)
MCV: 95.3 fL (ref 78.0–100.0)
MONO ABS: 0.8 10*3/uL (ref 0.1–1.0)
MONOS PCT: 7 %
NEUTROS ABS: 10.3 10*3/uL — AB (ref 1.7–7.7)
Neutrophils Relative %: 86 %
Platelets: 188 10*3/uL (ref 150–400)
RBC: 4.29 MIL/uL (ref 4.22–5.81)
RDW: 13.9 % (ref 11.5–15.5)
WBC: 12 10*3/uL — ABNORMAL HIGH (ref 4.0–10.5)

## 2015-11-05 LAB — BASIC METABOLIC PANEL
Anion gap: 9 (ref 5–15)
BUN: 17 mg/dL (ref 6–20)
CHLORIDE: 109 mmol/L (ref 101–111)
CO2: 23 mmol/L (ref 22–32)
Calcium: 9.1 mg/dL (ref 8.9–10.3)
Creatinine, Ser: 0.88 mg/dL (ref 0.61–1.24)
GFR calc Af Amer: >60 mL/min (ref >60)
GFR calc non Af Amer: >60 mL/min (ref >60)
Glucose, Bld: 114 mg/dL — ABNORMAL HIGH (ref 65–99)
POTASSIUM: 4.3 mmol/L (ref 3.5–5.1)
SODIUM: 141 mmol/L (ref 135–145)

## 2015-11-05 LAB — I-STAT TROPONIN, ED: Troponin i, poc: 0 ng/mL (ref 0.00–0.08)

## 2015-11-05 MED ORDER — AMIODARONE HCL 200 MG PO TABS: 400.0000 mg | ORAL_TABLET | Freq: Every day | ORAL | Status: DC

## 2015-11-05 MED ORDER — PROPOFOL 10 MG/ML IV BOLUS
INTRAVENOUS | Status: AC | PRN
  Administered 2015-11-05: 60 mg via INTRAVENOUS
  Administered 2015-11-05: 40 mg via INTRAVENOUS

## 2015-11-05 MED ORDER — PROPOFOL 10 MG/ML IV BOLUS
40.0000 mg | Freq: Once | INTRAVENOUS | Status: DC
  Filled 2015-11-05: qty 20

## 2015-11-05 NOTE — ED Notes (Signed)
Pt to ER for evaluation of atrial fibrillation. Pt has hx of PAF and is on amiodarone to maintain NSR. Pt reports he has felt as if he was atrial fibrillation for 1 week. Rate controlled at this time at 83. Pt is alert and oriented x4. Denies chest pain at this time, does report exertional shortness of breath. VSS.

## 2015-11-05 NOTE — ED Provider Notes (Signed)
CSN: [id number]     Arrival date & time 11/05/15  0711 History   First MD Initiated Contact with Patient 11/05/15 [phone]     Chief Complaint  Patient presents with  . Atrial Fibrillation    Per patient     (Consider location/radiation/quality/duration/timing/severity/associated sxs/prior Treatment) HPI Patient is a 55 y.o. male with a history of paroxysmal atrial fibrillation, heart failure with reduced EF, OSA, hypertension presenting to the ED in atrial fibrillation desiring cardioversion. He reports going to his general cardiologist at [facility] and was told he was in atrial fibrillation. He reports associated fatigue and dyspnea since about one week ago with exertion. He reports one episode of non-radiating left sided chest pain last night that resolved spontaneously. He is on amiodarone and coreg for his atrial fibrillation and chronic Xarelto for thrombus prophylaxis. He has been cardioverted in the past and is s/p ablation x2.  Past Medical History  Diagnosis Date  . Paroxysmal atrial fibrillation (HCC)   . TIA (transient ischemic attack)   . Hypercholesteremia   . Thrombocytopenia (HCC)   . Hypertension   . Obesity   . Depression   . Gout   . Hives   . GERD (gastroesophageal reflux disease)   . Cirrhosis (HCC)     liver scarring on biopsy, presumed to be due to methotrexate  . Psoriasis   . OSA (obstructive sleep apnea)     mild, uses CPAP  . CHF (congestive heart failure) (HCC)   . DJD (degenerative joint disease)   . Syncope 02/21/2014  . Persistent atrial fibrillation (HCC) 03/02/2012  . Essential hypertension 08/30/2013  . Chronic combined systolic and diastolic CHF (congestive heart failure) [facility])    Past Surgical History  Procedure Laterality Date  . Cholecystectomy  2011  . Nose surgery      Turbinates  . Reconstruction of nose    . Tee without cardioversion  03/16/2012    Procedure: TRANSESOPHAGEAL ECHOCARDIOGRAM (TEE);  Surgeon: [name], MD;   Location: [facility] ENDOSCOPY;  Service: Cardiovascular;  Laterality: N/A;  . Atrial fibrillation ablation  03/17/12    PVI and CTI ablation by Dr [name]  . Tendon repair      Right Forearm  . Tee without cardioversion N/A 09/17/2012    Procedure: TRANSESOPHAGEAL ECHOCARDIOGRAM (TEE);  Surgeon: Lewayne Bunting, MD;  Location: [facility] ENDOSCOPY;  Service: Cardiovascular;  Laterality: N/A;  . Ablation of dysrhythmic focus  09/17/2012  . Cardioversion N/A 10/09/2012    Procedure: CARDIOVERSION;  Surgeon: Laurey Morale, MD;  Location: [facility] ENDOSCOPY;  Service: Cardiovascular;  Laterality: N/A;  . Cardioversion N/A 02/22/2014    Procedure: CARDIOVERSION;  Surgeon: Quintella Reichert, MD;  Location: [facility] ENDOSCOPY;  Service: Cardiovascular;  Laterality: N/A;  . Atrial fibrillation ablation N/A 03/17/2012    Procedure: ATRIAL FIBRILLATION ABLATION;  Surgeon: Hillis Range, MD;  Location: [facility] CATH LAB;  Service: Cardiovascular;  Laterality: N/A;  . Atrial fibrillation ablation N/A 09/18/2012    Procedure: ATRIAL FIBRILLATION ABLATION;  Surgeon: Hillis Range, MD;  Location: [facility] CATH LAB;  Service: Cardiovascular;  Laterality: N/A;   Family History  Problem Relation Age of Onset  . Heart disease    . Diabetes    . Heart attack Father    Social History  Substance Use Topics  . Smoking status: Never Smoker   . Smokeless tobacco: Never Used  . Alcohol Use: No    Review of Systems  Constitutional: Positive for fatigue. Negative for fever.  Respiratory: Positive for shortness of breath. Negative for cough and chest tightness.   Cardiovascular: Positive for chest pain. Negative for palpitations and leg swelling.  Gastrointestinal: Negative for nausea, vomiting, abdominal pain and diarrhea.  Neurological: Negative for syncope and weakness.  All other systems reviewed and are negative.     Allergies  Penicillins  Home Medications   Prior to Admission medications   Medication Sig Start Date End Date Taking?  Authorizing Provider  amiodarone (PACERONE) 200 MG tablet Take 1 tablet (200 mg total) by mouth daily. 02/06/15   Hillis Range, MD  Barberry-Oreg Grape-Goldenseal (BERBERINE COMPLEX PO) Take 1,200 mg by mouth daily.    Historical Provider, MD  carvedilol (COREG) 3.125 MG tablet Take 1 tablet (3.125 mg total) by mouth 2 (two) times daily with a meal. 12/23/14   Hillis Range, MD  halobetasol (ULTRAVATE) 0.05 % cream Apply 1 application topically daily as needed (for psoriasis).     Historical Provider, MD  halobetasol (ULTRAVATE) 0.05 % cream Apply 1 application topically 2 (two) times daily as needed (FOR PSORIASIS).    Historical Provider, MD  ibuprofen (ADVIL,MOTRIN) 800 MG tablet Take 1,600 mg by mouth 2 (two) times daily as needed for moderate pain.    Historical Provider, MD  ketorolac (ACULAR) 0.5 % ophthalmic solution 1 drop. 04/04/15   Historical Provider, MD  lisinopril (PRINIVIL,ZESTRIL) 20 MG tablet Take 1 tablet (20 mg total) by mouth 2 (two) times daily. 04/05/15   Hillis Range, MD  moxifloxacin (VIGAMOX) 0.5 % ophthalmic solution 1 drop. 04/04/15   Historical Provider, MD  prednisoLONE acetate (PRED FORTE) 1 % ophthalmic suspension 1 drop. 04/04/15   Historical Provider, MD  rivaroxaban (XARELTO) 20 MG TABS tablet Take 20 mg by mouth.    Historical Provider, MD  sertraline (ZOLOFT) 100 MG tablet Take 200 mg by mouth daily.     Historical Provider, MD  simvastatin (ZOCOR) 20 MG tablet Take 1 tablet (20 mg total) by mouth daily. 10/31/15   Hillis Range, MD  traZODone (DESYREL) 100 MG tablet  03/14/15   Historical Provider, MD  XARELTO 20 MG TABS tablet TAKE 1 TABLET BY MOUTH EVERY DAY 01/16/15   Hillis Range, MD  zolpidem (AMBIEN) 10 MG tablet TK 1 T PO QHS 10/30/14   Historical Provider, MD   BP 138/92 mmHg  Pulse 91  Temp(Src) 97.9 F (36.6 C) (Oral)  Resp 16  SpO2 97% Physical Exam  Constitutional: He is oriented to person, place, and time. He appears well-developed and well-nourished.   HENT:  Mouth/Throat: Oropharynx is clear and moist.  Eyes: Conjunctivae and EOM are normal. Pupils are equal, round, and reactive to light.  Neck: Normal range of motion. Neck supple.  Cardiovascular: Normal rate, normal heart sounds and normal pulses.  An irregularly irregular rhythm present.  No murmur heard. Pulmonary/Chest: Effort normal and breath sounds normal. No respiratory distress. He has no wheezes. He has no rales.  Abdominal: Soft. Bowel sounds are normal. There is no tenderness. There is no rebound and no guarding.  Musculoskeletal: Normal range of motion. He exhibits no edema or tenderness.  Neurological: He is alert and oriented to person, place, and time.  Skin: Skin is warm and dry.  Vitals reviewed.   ED Course  .Cardioversion Date/Time: 11/05/2015 8:50 AM Performed by: Narda Bonds Authorized by: Zadie Rhine Consent: Verbal consent obtained. Written consent obtained. Risks and benefits: risks, benefits and alternatives were discussed Consent given by: patient Patient understanding: patient states understanding of  the procedure being performed Patient consent: the patient's understanding of the procedure matches consent given Procedure consent: procedure consent matches procedure scheduled Relevant documents: relevant documents present and verified Test results: test results available and properly labeled Required items: required blood products, implants, devices, and special equipment available Patient identity confirmed: verbally with patient and arm band Time out: Immediately prior to procedure a "time out" was called to verify the correct patient, procedure, equipment, support staff and site/side marked as required. Patient sedated: yes Cardioversion basis: elective Indications: failure of anti-arrhythmic medications Pre-procedure rhythm: atrial fibrillation Patient position: patient was placed in a supine position Chest area: chest area  exposed Electrodes: pads Electrodes placed: anterior-posterior Number of attempts: 2 Attempt 1 mode: synchronous Attempt 1 waveform: biphasic Attempt 1 shock (in Joules): 150 Attempt 1 outcome: no change in rhythm Attempt 2 mode: synchronous Attempt 2 waveform: biphasic Attempt 2 shock (in Joules): 200 Attempt 2 outcome: no change in rhythm Post-procedure rhythm: atrial fibrillation Complications: no complications Patient tolerance: Patient tolerated the procedure well with no immediate complications   (including critical care time) Labs Review Labs Reviewed  CBC WITH DIFFERENTIAL/PLATELET - Abnormal; Notable for the following:    WBC 12.0 (*)    Neutro Abs 10.3 (*)    All other components within normal limits  BASIC METABOLIC PANEL - Abnormal; Notable for the following:    Glucose, Bld 114 (*)    All other components within normal limits  I-STAT TROPOININ, ED    Imaging Review No results found. I have personally reviewed and evaluated these images and lab results as part of my medical decision-making.   EKG Interpretation   Date/Time:  Sunday Nov 05 2015 81:19:14 EDT Ventricular Rate:  90 PR Interval:    QRS Duration: 92 QT Interval:  364 QTC Calculation: 445 R Axis:   58 Text Interpretation:  Atrial fibrillation Borderline T abnormalities,  inferior leads Baseline wander in lead(s) I II III aVR aVL V1 V2 V3 V4 V5  V6 Abnormal ekg Confirmed by Bebe Shaggy  MD, DONALD ([id number]) on 11/05/2015  7:26:59 AM      8:13AM: Discussed case with Dr. Jacinto Halim. Agreed with plan for cardioversion since patient is stable, is on chronic anticoagulation and symptomatic. Recommended outpatient management of amiodarone with by primary cardiologist.  MDM   Final diagnoses:  Paroxysmal atrial fibrillation (HCC)    Attempted cardioversion, however, not able to convert to sinus rhythm. Discussed with Dr. Ladona Ridgel and advised to increase amiodarone to 400mg  with follow-up at atrial  fibrillation clinic this month. Discussed this plan with patient and he is in agreement. Prior to discharge, patient stated he had no issues. Vitals were stable, troponin negative, labs unremarkable except for slightly elevated WBC. Patient safe for discharge home with wife.    Narda Bonds, MD 11/06/15 1156  Zadie Rhine, MD 11/06/15 [phone]

## 2015-11-05 NOTE — ED Provider Notes (Signed)
Post procedure EKG:   EKG Interpretation  Date/Time:  Sunday Nov 05 2015 08:56:22 EDT Ventricular Rate:  86 PR Interval:    QRS Duration: 92 QT Interval:  383 QTC Calculation: 458 R Axis:   17 Text Interpretation:  Atrial fibrillation Low voltage, precordial leads No significant change since last tracing Confirmed by Bebe Shaggy  MD, Kenlee Vogt ([id number]) on 11/05/2015 9:01:03 AM        Zadie Rhine, MD 11/05/15 [phone]

## 2015-11-05 NOTE — Discharge Instructions (Signed)
PLEASE INCREASE YOUR AMIODARONE TO 400MG  DAILY AND FOLLOWUP WITH ATRIAL FIBRILLATION CLINIC IN THE NEXT 1-2 WEEKS  We tried to cardiovert you, but unfortunately this failed. If you develop worsening symptoms, develop chest pain, confusion, unresponsibly, please return promptly for evaluation.

## 2015-11-05 NOTE — ED Provider Notes (Signed)
Procedural sedation Performed by: Joya Gaskins Consent: Verbal consent obtained. Risks and benefits: risks, benefits and alternatives were discussed Required items: required devices, and special equipment available Patient identity confirmed: arm band and provided demographic data Time out: Immediately prior to procedure a "time out" was called to verify the correct patient, procedure, equipment, support staff and site/side marked as required. Sedation type: deep sedation NPO time confirmed and considered Sedatives: PROPOFOL Physician Time at Bedside: 17 Vitals: Vital signs were monitored during sedation. Cardiac Monitor, pulse oximeter Patient tolerance: Patient tolerated the procedure well with no immediate complications. Comments: Pt with uneventful recovery. Returned to pre-procedural sedation baseline     Zadie Rhine, MD 11/05/15 [phone]

## 2015-11-05 NOTE — ED Provider Notes (Signed)
Patient seen/examined in the Emergency Department in conjunction with Resident Physician Provider [facility] Patient reports atrial fibrillation for about a week.  He reports feeling fatigued.  He reports he has been anticoagulated with xarelto for "years" and has not missed any dosing, last dose was last night Exam : awake/alert, no distress, irregular rhythm noted Plan: after long discussion, pt is requesting cardioversion His last meal was last night He has been on xarelto >1 month We discussed risk/benefit of cardioversion.  He understands risk of stroke, and he accepts these risks    Zadie Rhine, MD 11/05/15 [phone]

## 2015-11-05 NOTE — Sedation Documentation (Addendum)
Shock delivered at 150J 

## 2015-11-05 NOTE — ED Provider Notes (Signed)
D/W DR Ladona Ridgel HE REVIEWED CHART SINCE CARDIOVERSION FAILED, HE RECOMMENDS STARTING AMIODARONE 400MG  DAILY AND F/U WITH AFIB CLINIC THIS MONTH   Zadie Rhine, MD 11/05/15 [phone]

## 2015-11-05 NOTE — Progress Notes (Signed)
Patient ID: Joseph Garrett, male   DOB: 1961-03-18, 55 y.o.   MRN: [id number] EP I was called by Dr. Bebe Shaggy regarding Mr. Joseph Garrett who has gone into atrial fib on amio 200 mg daily. He was attempted to cardiovert in the ER and unsuccessful. He is anti-coagualated. I have recommended he have his dose of amio increased to 400 mg daily and followup in our atrial fib clinic.  Leonia Reeves.D.

## 2015-11-21 ENCOUNTER — Encounter: Payer: Self-pay | Admitting: Pulmonary Disease

## 2015-12-13 ENCOUNTER — Other Ambulatory Visit: Payer: Self-pay | Admitting: Internal Medicine

## 2015-12-27 ENCOUNTER — Encounter (HOSPITAL_COMMUNITY): Payer: Self-pay | Admitting: Nurse Practitioner

## 2015-12-27 ENCOUNTER — Ambulatory Visit (HOSPITAL_COMMUNITY)
Admission: RE | Admit: 2015-12-27 | Discharge: 2015-12-27 | Disposition: A | Discharge status: Home / Self Care | Payer: BLUE CROSS/BLUE SHIELD | Source: Ambulatory Visit | Attending: Nurse Practitioner | Admitting: Nurse Practitioner

## 2015-12-27 ENCOUNTER — Other Ambulatory Visit (HOSPITAL_COMMUNITY): Payer: Self-pay | Admitting: Nurse Practitioner

## 2015-12-27 VITALS — BP 118/82 | HR 104 | Ht 74.0 in | Wt 309.4 lb

## 2015-12-27 DIAGNOSIS — K219 Gastro-esophageal reflux disease without esophagitis: Secondary | ICD-10-CM | POA: Insufficient documentation

## 2015-12-27 DIAGNOSIS — I429 Cardiomyopathy, unspecified: Secondary | ICD-10-CM | POA: Insufficient documentation

## 2015-12-27 DIAGNOSIS — I110 Hypertensive heart disease with heart failure: Secondary | ICD-10-CM | POA: Insufficient documentation

## 2015-12-27 DIAGNOSIS — K7460 Unspecified cirrhosis of liver: Secondary | ICD-10-CM | POA: Insufficient documentation

## 2015-12-27 DIAGNOSIS — D696 Thrombocytopenia, unspecified: Secondary | ICD-10-CM | POA: Insufficient documentation

## 2015-12-27 DIAGNOSIS — Z8673 Personal history of transient ischemic attack (TIA), and cerebral infarction without residual deficits: Secondary | ICD-10-CM | POA: Diagnosis not present

## 2015-12-27 DIAGNOSIS — E669 Obesity, unspecified: Secondary | ICD-10-CM | POA: Diagnosis not present

## 2015-12-27 DIAGNOSIS — I4819 Other persistent atrial fibrillation: Secondary | ICD-10-CM

## 2015-12-27 DIAGNOSIS — E7800 Pure hypercholesterolemia, unspecified: Secondary | ICD-10-CM | POA: Diagnosis not present

## 2015-12-27 DIAGNOSIS — I5042 Chronic combined systolic (congestive) and diastolic (congestive) heart failure: Secondary | ICD-10-CM | POA: Diagnosis not present

## 2015-12-27 DIAGNOSIS — F329 Major depressive disorder, single episode, unspecified: Secondary | ICD-10-CM | POA: Diagnosis not present

## 2015-12-27 DIAGNOSIS — I481 Persistent atrial fibrillation: Secondary | ICD-10-CM | POA: Diagnosis not present

## 2015-12-27 DIAGNOSIS — I480 Paroxysmal atrial fibrillation: Secondary | ICD-10-CM | POA: Insufficient documentation

## 2015-12-27 DIAGNOSIS — Z7901 Long term (current) use of anticoagulants: Secondary | ICD-10-CM | POA: Diagnosis not present

## 2015-12-27 LAB — CBC
HEMATOCRIT: 39 % (ref 39.0–52.0)
Hemoglobin: 13.1 g/dL (ref 13.0–17.0)
MCH: 30.5 pg (ref 26.0–34.0)
MCHC: 33.6 g/dL (ref 30.0–36.0)
MCV: 90.9 fL (ref 78.0–100.0)
Platelets: 175 10*3/uL (ref 150–400)
RBC: 4.29 MIL/uL (ref 4.22–5.81)
RDW: 13.3 % (ref 11.5–15.5)
WBC: 5.3 10*3/uL (ref 4.0–10.5)

## 2015-12-27 LAB — COMPREHENSIVE METABOLIC PANEL
ALT: 34 U/L (ref 17–63)
ANION GAP: 6 (ref 5–15)
AST: 27 U/L (ref 15–41)
Albumin: 3.8 g/dL (ref 3.5–5.0)
Alkaline Phosphatase: 66 U/L (ref 38–126)
BILIRUBIN TOTAL: 1.5 mg/dL — AB (ref 0.3–1.2)
BUN: 15 mg/dL (ref 6–20)
CO2: 24 mmol/L (ref 22–32)
Calcium: 9.1 mg/dL (ref 8.9–10.3)
Chloride: 104 mmol/L (ref 101–111)
Creatinine, Ser: 1.18 mg/dL (ref 0.61–1.24)
Glucose, Bld: 93 mg/dL (ref 65–99)
POTASSIUM: 3.9 mmol/L (ref 3.5–5.1)
Sodium: 134 mmol/L — ABNORMAL LOW (ref 135–145)
TOTAL PROTEIN: 6.3 g/dL — AB (ref 6.5–8.1)

## 2015-12-27 LAB — TSH: TSH: 0.282 u[IU]/mL — AB (ref 0.350–4.500)

## 2015-12-27 NOTE — Progress Notes (Signed)
Patient ID: Joseph Garrett, male   DOB: 07/22/1960, 55 y.o.   MRN: [id number]     Primary Care Physician: RADIONTCHENKO, ALEXEI, MD Referring Physician: Dr. Allred  Joseph Garrett is a 55 y.o. male with a h/o persistent afib s/p abaltion x 2 on amiodarone. He is here for ER f/u from 5/7, where he presented in afib and did not cardiovert x 3 attempts.  It was recommended that he increase amiodarone to 400 mg daily and f/u in afib clinic. He did increase amiodarone x 2 -3 weeks but recently reduced dose on his own and called to make appointment. He does feel worse in afib. He states his last cardioversion was in the cath lab and he required 300 joules. He has not missed any doses of xarelto. He was sent to pulmonology for abnormal PFT's last fall and was asked to return in 4 months for repeat testing and he cancelled appointment.   Today, he denies symptoms of palpitations, chest pain, shortness of breath, orthopnea, PND, lower extremity edema, dizziness, presyncope, syncope, or neurologic sequela.Positive for fatigue. The patient is tolerating medications without difficulties and is otherwise without complaint today.   Past Medical History  Diagnosis Date  . Paroxysmal atrial fibrillation (HCC)   . TIA (transient ischemic attack)   . Hypercholesteremia   . Thrombocytopenia (HCC)   . Hypertension   . Obesity   . Depression   . Gout   . Hives   . GERD (gastroesophageal reflux disease)   . Cirrhosis (HCC)     liver scarring on biopsy, presumed to be due to methotrexate  . Psoriasis   . OSA (obstructive sleep apnea)     mild, uses CPAP  . CHF (congestive heart failure) (HCC)   . DJD (degenerative joint disease)   . Syncope 02/21/2014  . Persistent atrial fibrillation (HCC) 03/02/2012  . Essential hypertension 08/30/2013  . Chronic combined systolic and diastolic CHF (congestive heart failure) (HCC)    Past Surgical History  Procedure Laterality Date  . Cholecystectomy  2011  . Nose  surgery      Turbinates  . Reconstruction of nose    . Tee without cardioversion  03/16/2012    Procedure: TRANSESOPHAGEAL ECHOCARDIOGRAM (TEE);  Surgeon: Paula V Ross, MD;  Location: [facility] ENDOSCOPY;  Service: Cardiovascular;  Laterality: N/A;  . Atrial fibrillation ablation  03/17/12    PVI and CTI ablation by Dr Allred  . Tendon repair      Right Forearm  . Tee without cardioversion N/A 09/17/2012    Procedure: TRANSESOPHAGEAL ECHOCARDIOGRAM (TEE);  Surgeon: Brian S Crenshaw, MD;  Location: [facility] ENDOSCOPY;  Service: Cardiovascular;  Laterality: N/A;  . Ablation of dysrhythmic focus  09/17/2012  . Cardioversion N/A 10/09/2012    Procedure: CARDIOVERSION;  Surgeon: Dalton S McLean, MD;  Location: [facility] ENDOSCOPY;  Service: Cardiovascular;  Laterality: N/A;  . Cardioversion N/A 02/22/2014    Procedure: CARDIOVERSION;  Surgeon: Traci R Turner, MD;  Location: [facility] ENDOSCOPY;  Service: Cardiovascular;  Laterality: N/A;  . Atrial fibrillation ablation N/A 03/17/2012    Procedure: ATRIAL FIBRILLATION ABLATION;  Surgeon: James Allred, MD;  Location: [facility] CATH LAB;  Service: Cardiovascular;  Laterality: N/A;  . Atrial fibrillation ablation N/A 09/18/2012    Procedure: ATRIAL FIBRILLATION ABLATION;  Surgeon: James Allred, MD;  Location: [facility] CATH LAB;  Service: Cardiovascular;  Laterality: N/A;    Current Outpatient Prescriptions  Medication Sig Dispense Refill  . amiodarone (PACERONE) 200 MG tablet Take 200 mg by mouth   2 (two) times daily.    . carvedilol (COREG) 3.125 MG tablet Take 1 tablet (3.125 mg total) by mouth 2 (two) times daily with a meal. 60 tablet 11  . ibuprofen (ADVIL,MOTRIN) 800 MG tablet Take 1,600 mg by mouth 2 (two) times daily as needed for moderate pain.    Marland Kitchen lisinopril (PRINIVIL,ZESTRIL) 20 MG tablet TAKE 1 TABLET TWICE A DAY 60 tablet 0  . Oxycodone HCl 10 MG TABS Take 1 tablet by mouth daily as needed.     Marland Kitchen oxymetazoline (AFRIN) 0.05 % nasal spray Place 2 sprays into both nostrils 2 (two)  times daily as needed for congestion.    . rivaroxaban (XARELTO) 20 MG TABS tablet Take 20 mg by mouth every evening.     . sertraline (ZOLOFT) 100 MG tablet Take 150 mg by mouth daily.     . simvastatin (ZOCOR) 20 MG tablet Take 1 tablet (20 mg total) by mouth daily. 90 tablet 0   No current facility-administered medications for this encounter.    Allergies  Allergen Reactions  . Penicillins Rash    Social History   Social History  . Marital Status: Married    Spouse Name: N/A  . Number of Children: N/A  . Years of Education: N/A   Occupational History  . Not on file.   Social History Main Topics  . Smoking status: Never Smoker   . Smokeless tobacco: Never Used  . Alcohol Use: No  . Drug Use: No  . Sexual Activity: Not on file   Other Topics Concern  . Not on file   Social History Narrative   Lives in [place] with spouse.  3 children are healthy.     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    Family History  Problem Relation Age of Onset  . Heart disease    . Diabetes    . Heart attack Father     ROS- All systems are reviewed and negative except as per the HPI above  Physical Exam: Filed Vitals:   12/27/15 1422  BP: 118/82  Pulse: 104  Height: 6\' 2"  (1.88 m)  Weight: 309 lb 6.4 oz (140.343 kg)    GEN- The patient is well appearing, alert and oriented x 3 today.   Head- normocephalic, atraumatic Eyes-  Sclera clear, conjunctiva pink Ears- hearing intact Oropharynx- clear Neck- supple, no JVP Lymph- no cervical lymphadenopathy Lungs- Clear to ausculation bilaterally, normal work of breathing Heart- irregular rate and rhythm, no murmurs, rubs or gallops, PMI not laterally displaced GI- soft, NT, ND, + BS Extremities- no clubbing, cyanosis, or edema MS- no significant deformity or atrophy Skin- no rash or lesion Psych- euthymic mood, full affect Neuro- strength and sensation are intact  EKG- afib/flutter at 104 bpm, qrs 80 ms,  qtc 460 ms.  Epic records reviewed  Assessment and Plan: 1.Persisitent afib since failed cardioversion 5/07  He desires to be back in rhythm Increase amiodarone back to 200 mg bid until cardioversion Schedule cardioversion in cath lab where higher energy can be used since he failed 200 joules with Dr. [name] Weight loss encouraged, states he lost 25+ pounds but gained back when he hurt his back.  2. HTN Stable  3. Nonischemic CM Stable  F/u with Dr. Nehemiah Settle [facility] [address] [place], [place] [address] [phone]

## 2015-12-27 NOTE — Patient Instructions (Signed)
Your physician has recommended you make the following change in your medication:  1)Increase Amiodarone to 200mg  twice a day  Cardioversion scheduled for Friday, June 30th with Dr. [name]  - Arrive at the [facility] and go to admitting at 9:30AM  -Do not eat or drink anything after midnight the night prior to your procedure.  - Take all your medication with a sip of water prior to arrival.  - You will not be able to drive home after your procedure.   Scheduler will be in touch with you regarding follow up with Dr. [name] in 2 weeks.

## 2015-12-29 ENCOUNTER — Ambulatory Visit (HOSPITAL_COMMUNITY): Payer: BLUE CROSS/BLUE SHIELD | Admitting: Certified Registered"

## 2015-12-29 ENCOUNTER — Ambulatory Visit (HOSPITAL_COMMUNITY)
Admission: RE | Admit: 2015-12-29 | Discharge: 2015-12-29 | Disposition: A | Discharge status: Home / Self Care | Payer: BLUE CROSS/BLUE SHIELD | Source: Ambulatory Visit | Attending: Internal Medicine | Admitting: Internal Medicine

## 2015-12-29 ENCOUNTER — Encounter (HOSPITAL_COMMUNITY)
Admission: RE | Disposition: A | Discharge status: Home / Self Care | Payer: Self-pay | Source: Ambulatory Visit | Attending: Internal Medicine

## 2015-12-29 ENCOUNTER — Encounter (HOSPITAL_COMMUNITY): Payer: Self-pay | Admitting: Anesthesiology

## 2015-12-29 DIAGNOSIS — F329 Major depressive disorder, single episode, unspecified: Secondary | ICD-10-CM | POA: Diagnosis not present

## 2015-12-29 DIAGNOSIS — E7800 Pure hypercholesterolemia, unspecified: Secondary | ICD-10-CM | POA: Diagnosis not present

## 2015-12-29 DIAGNOSIS — Z7901 Long term (current) use of anticoagulants: Secondary | ICD-10-CM | POA: Insufficient documentation

## 2015-12-29 DIAGNOSIS — Z8249 Family history of ischemic heart disease and other diseases of the circulatory system: Secondary | ICD-10-CM | POA: Diagnosis not present

## 2015-12-29 DIAGNOSIS — M1990 Unspecified osteoarthritis, unspecified site: Secondary | ICD-10-CM | POA: Diagnosis not present

## 2015-12-29 DIAGNOSIS — Z833 Family history of diabetes mellitus: Secondary | ICD-10-CM | POA: Diagnosis not present

## 2015-12-29 DIAGNOSIS — I480 Paroxysmal atrial fibrillation: Secondary | ICD-10-CM | POA: Diagnosis not present

## 2015-12-29 DIAGNOSIS — Z8673 Personal history of transient ischemic attack (TIA), and cerebral infarction without residual deficits: Secondary | ICD-10-CM | POA: Diagnosis not present

## 2015-12-29 DIAGNOSIS — M109 Gout, unspecified: Secondary | ICD-10-CM | POA: Insufficient documentation

## 2015-12-29 DIAGNOSIS — G4733 Obstructive sleep apnea (adult) (pediatric): Secondary | ICD-10-CM | POA: Diagnosis not present

## 2015-12-29 DIAGNOSIS — Z880 Allergy status to penicillin: Secondary | ICD-10-CM | POA: Diagnosis not present

## 2015-12-29 DIAGNOSIS — I110 Hypertensive heart disease with heart failure: Secondary | ICD-10-CM | POA: Diagnosis not present

## 2015-12-29 DIAGNOSIS — K7460 Unspecified cirrhosis of liver: Secondary | ICD-10-CM | POA: Diagnosis not present

## 2015-12-29 DIAGNOSIS — K219 Gastro-esophageal reflux disease without esophagitis: Secondary | ICD-10-CM | POA: Insufficient documentation

## 2015-12-29 DIAGNOSIS — D696 Thrombocytopenia, unspecified: Secondary | ICD-10-CM | POA: Diagnosis not present

## 2015-12-29 DIAGNOSIS — E669 Obesity, unspecified: Secondary | ICD-10-CM | POA: Diagnosis not present

## 2015-12-29 DIAGNOSIS — I428 Other cardiomyopathies: Secondary | ICD-10-CM | POA: Diagnosis not present

## 2015-12-29 DIAGNOSIS — I481 Persistent atrial fibrillation: Secondary | ICD-10-CM | POA: Insufficient documentation

## 2015-12-29 DIAGNOSIS — I959 Hypotension, unspecified: Secondary | ICD-10-CM | POA: Diagnosis not present

## 2015-12-29 DIAGNOSIS — I5042 Chronic combined systolic (congestive) and diastolic (congestive) heart failure: Secondary | ICD-10-CM | POA: Insufficient documentation

## 2015-12-29 HISTORY — PX: CARDIOVERSION: SHX1299

## 2015-12-29 HISTORY — PX: ELECTROPHYSIOLOGIC STUDY: SHX172A

## 2015-12-29 SURGERY — CARDIOVERSION (CATH LAB)
Anesthesia: LOCAL

## 2015-12-29 SURGERY — CARDIOVERSION
Anesthesia: General

## 2015-12-29 MED ORDER — LIDOCAINE HCL (CARDIAC) 20 MG/ML IV SOLN
INTRAVENOUS | Status: DC | PRN
  Administered 2015-12-29: 40 mg via INTRATRACHEAL

## 2015-12-29 MED ORDER — SODIUM CHLORIDE 0.9 % IV SOLN
INTRAVENOUS | Status: DC | PRN
  Administered 2015-12-29: 14:00:00 via INTRAVENOUS

## 2015-12-29 MED ORDER — PROPOFOL 10 MG/ML IV BOLUS
INTRAVENOUS | Status: DC | PRN
  Administered 2015-12-29: 80 mg via INTRAVENOUS

## 2015-12-29 SURGICAL SUPPLY — 1 items: PAD DEFIB LIFELINK (PAD) ×2 IMPLANT

## 2015-12-29 NOTE — Interval H&P Note (Signed)
History and Physical Interval Note:  12/29/2015 1:58 PM  Joseph Garrett  has presented today for surgery, with the diagnosis of Arrhythmia  The various methods of treatment have been discussed with the patient and family. After consideration of risks, benefits and other options for treatment, the patient has consented to  Procedure(s): CARDIOVERSION (N/A) as a surgical intervention .  The patient's history has been reviewed, patient examined, no change in status, stable for surgery.  I have reviewed the patient's chart and labs.  Questions were answered to the patient's satisfaction.    Reports compliance with xarelto without interruption.  Risks of cardioversion discussed at length with patient who wishes to proceed. Hillis Range

## 2015-12-29 NOTE — Discharge Instructions (Signed)
Electrical Cardioversion, Care After °Refer to this sheet in the next few weeks. These instructions provide you with information on caring for yourself after your procedure. Your health care provider may also give you more specific instructions. Your treatment has been planned according to current medical practices, but problems sometimes occur. Call your health care provider if you have any problems or questions after your procedure. °WHAT TO EXPECT AFTER THE PROCEDURE °After your procedure, it is typical to have the following sensations: °· Some redness on the skin where the shocks were delivered. If this is tender, a sunburn lotion or hydrocortisone cream may help. °· Possible return of an abnormal heart rhythm within hours or days after the procedure. °HOME CARE INSTRUCTIONS °· Take medicines only as directed by your health care provider. Be sure you understand how and when to take your medicine. °· Learn how to feel your pulse and check it often. °· Limit your activity for 48 hours after the procedure or as directed by your health care provider. °· Avoid or minimize caffeine and other stimulants as directed by your health care provider. °SEEK MEDICAL CARE IF: °· You feel like your heart is beating too fast or your pulse is not regular. °· You have any questions about your medicines. °· You have bleeding that will not stop. °SEEK IMMEDIATE MEDICAL CARE IF: °· You are dizzy or feel faint. °· It is hard to breathe or you feel short of breath. °· There is a change in discomfort in your chest. °· Your speech is slurred or you have trouble moving an arm or leg on one side of your body. °· You get a serious muscle cramp that does not go away. °· Your fingers or toes turn cold or blue. °  °This information is not intended to replace advice given to you by your health care provider. Make sure you discuss any questions you have with your health care provider. °  °Document Released: 04/07/2013 Document Revised: 07/08/2014  Document Reviewed: 04/07/2013 °Elsevier Interactive Patient Education ©2016 [employer] ° °

## 2015-12-29 NOTE — Anesthesia Postprocedure Evaluation (Signed)
Anesthesia Post Note  Patient: Joseph Garrett  Procedure(s) Performed: Procedure(s) (LRB): CARDIOVERSION (N/A)  Patient location during evaluation: PACU Anesthesia Type: General Level of consciousness: awake, oriented and patient cooperative Pain management: pain level controlled Vital Signs Assessment: post-procedure vital signs reviewed and stable Respiratory status: spontaneous breathing and respiratory function stable Cardiovascular status: blood pressure returned to baseline Anesthetic complications: no    Last Vitals:  Filed Vitals:   12/29/15 1238 12/29/15 1253  BP: 121/96 122/96  Pulse: 90 90  Temp: 36.6 C 36.6 C  Resp: 18 18    Last Pain: There were no vitals filed for this visit.               Demarco Bacci EDWARD

## 2015-12-29 NOTE — H&P (View-Only) (Signed)
Patient ID: Joseph Garrett, male   DOB: 1961/03/30, 55 y.o.   MRN: [id number]     Primary Care Physician: Verl Bangs, MD Referring Physician: Dr. Donne [name] is a 55 y.o. male with a h/o persistent afib s/p abaltion x 2 on amiodarone. He is here for ER f/u from 5/7, where he presented in afib and did not cardiovert x 3 attempts.  It was recommended that he increase amiodarone to 400 mg daily and f/u in afib clinic. He did increase amiodarone x 2 -3 weeks but recently reduced dose on his own and called to make appointment. He does feel worse in afib. He states his last cardioversion was in the cath lab and he required 300 joules. He has not missed any doses of xarelto. He was sent to pulmonology for abnormal PFT's last fall and was asked to return in 4 months for repeat testing and he cancelled appointment.   Today, he denies symptoms of palpitations, chest pain, shortness of breath, orthopnea, PND, lower extremity edema, dizziness, presyncope, syncope, or neurologic sequela.Positive for fatigue. The patient is tolerating medications without difficulties and is otherwise without complaint today.   Past Medical History  Diagnosis Date  . Paroxysmal atrial fibrillation (HCC)   . TIA (transient ischemic attack)   . Hypercholesteremia   . Thrombocytopenia (HCC)   . Hypertension   . Obesity   . Depression   . Gout   . Hives   . GERD (gastroesophageal reflux disease)   . Cirrhosis (HCC)     liver scarring on biopsy, presumed to be due to methotrexate  . Psoriasis   . OSA (obstructive sleep apnea)     mild, uses CPAP  . CHF (congestive heart failure) (HCC)   . DJD (degenerative joint disease)   . Syncope 02/21/2014  . Persistent atrial fibrillation (HCC) 03/02/2012  . Essential hypertension 08/30/2013  . Chronic combined systolic and diastolic CHF (congestive heart failure) [facility])    Past Surgical History  Procedure Laterality Date  . Cholecystectomy  2011  . Nose  surgery      Turbinates  . Reconstruction of nose    . Tee without cardioversion  03/16/2012    Procedure: TRANSESOPHAGEAL ECHOCARDIOGRAM (TEE);  Surgeon: [name], MD;  Location: [facility] ENDOSCOPY;  Service: Cardiovascular;  Laterality: N/A;  . Atrial fibrillation ablation  03/17/12    PVI and CTI ablation by Dr [name]  . Tendon repair      Right Forearm  . Tee without cardioversion N/A 09/17/2012    Procedure: TRANSESOPHAGEAL ECHOCARDIOGRAM (TEE);  Surgeon: Lewayne Bunting, MD;  Location: [facility] ENDOSCOPY;  Service: Cardiovascular;  Laterality: N/A;  . Ablation of dysrhythmic focus  09/17/2012  . Cardioversion N/A 10/09/2012    Procedure: CARDIOVERSION;  Surgeon: Laurey Morale, MD;  Location[facility] ENDOSCOPY;  Service: Cardiovascular;  Laterality: N/A;  . Cardioversion N/A 02/22/2014    Procedure: CARDIOVERSION;  Surgeon: Quintella Reichert, MD;  Location: [facility] ENDOSCOPY;  Service: Cardiovascular;  Laterality: N/A;  . Atrial fibrillation ablation N/A 03/17/2012    Procedure: ATRIAL FIBRILLATION ABLATION;  Surgeon: Hillis Range, MD;  Location: [facility] CATH LAB;  Service: Cardiovascular;  Laterality: N/A;  . Atrial fibrillation ablation N/A 09/18/2012    Procedure: ATRIAL FIBRILLATION ABLATION;  Surgeon: Hillis Range, MD;  Location: [facility] CATH LAB;  Service: Cardiovascular;  Laterality: N/A;    Current Outpatient Prescriptions  Medication Sig Dispense Refill  . amiodarone (PACERONE) 200 MG tablet Take 200 mg by mouth  2 (two) times daily.    . carvedilol (COREG) 3.125 MG tablet Take 1 tablet (3.125 mg total) by mouth 2 (two) times daily with a meal. 60 tablet 11  . ibuprofen (ADVIL,MOTRIN) 800 MG tablet Take 1,600 mg by mouth 2 (two) times daily as needed for moderate pain.    Marland Kitchen lisinopril (PRINIVIL,ZESTRIL) 20 MG tablet TAKE 1 TABLET TWICE A DAY 60 tablet 0  . Oxycodone HCl 10 MG TABS Take 1 tablet by mouth daily as needed.     Marland Kitchen oxymetazoline (AFRIN) 0.05 % nasal spray Place 2 sprays into both nostrils 2 (two)  times daily as needed for congestion.    . rivaroxaban (XARELTO) 20 MG TABS tablet Take 20 mg by mouth every evening.     . sertraline (ZOLOFT) 100 MG tablet Take 150 mg by mouth daily.     . simvastatin (ZOCOR) 20 MG tablet Take 1 tablet (20 mg total) by mouth daily. 90 tablet 0   No current facility-administered medications for this encounter.    Allergies  Allergen Reactions  . Penicillins Rash    Social History   Social History  . Marital Status: Married    Spouse Name: N/A  . Number of Children: N/A  . Years of Education: N/A   Occupational History  . Not on file.   Social History Main Topics  . Smoking status: Never Smoker   . Smokeless tobacco: Never Used  . Alcohol Use: No  . Drug Use: No  . Sexual Activity: Not on file   Other Topics Concern  . Not on file   Social History Narrative   Lives in [place] with spouse.  3 children are healthy.     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    Family History  Problem Relation Age of Onset  . Heart disease    . Diabetes    . Heart attack Father     ROS- All systems are reviewed and negative except as per the HPI above  Physical Exam: Filed Vitals:   12/27/15 1422  BP: 118/82  Pulse: 104  Height: 6\' 2"  (1.88 m)  Weight: 309 lb 6.4 oz (140.343 kg)    GEN- The patient is well appearing, alert and oriented x 3 today.   Head- normocephalic, atraumatic Eyes-  Sclera clear, conjunctiva pink Ears- hearing intact Oropharynx- clear Neck- supple, no JVP Lymph- no cervical lymphadenopathy Lungs- Clear to ausculation bilaterally, normal work of breathing Heart- irregular rate and rhythm, no murmurs, rubs or gallops, PMI not laterally displaced GI- soft, NT, ND, + BS Extremities- no clubbing, cyanosis, or edema MS- no significant deformity or atrophy Skin- no rash or lesion Psych- euthymic mood, full affect Neuro- strength and sensation are intact  EKG- afib/flutter at 104 bpm, qrs 80 ms,  qtc 460 ms.  Epic records reviewed  Assessment and Plan: 1.Persisitent afib since failed cardioversion 5/07  He desires to be back in rhythm Increase amiodarone back to 200 mg bid until cardioversion Schedule cardioversion in cath lab where higher energy can be used since he failed 200 joules with Dr. [name] Weight loss encouraged, states he lost 25+ pounds but gained back when he hurt his back.  2. HTN Stable  3. Nonischemic CM Stable  F/u with Dr. Nehemiah Settle [facility] [address] [place], [place] [address] [phone]

## 2015-12-29 NOTE — Progress Notes (Signed)
Pt dressing. Will be taken by wheelchair to car .

## 2015-12-29 NOTE — Transfer of Care (Signed)
Immediate Anesthesia Transfer of Care Note  Patient: Joseph Garrett  Procedure(s) Performed: Procedure(s): CARDIOVERSION (N/A)  Patient Location: PACU and Cath Lab  Anesthesia Type:General  Level of Consciousness: awake, oriented and patient cooperative  Airway & Oxygen Therapy: Patient Spontanous Breathing and Patient connected to nasal cannula oxygen  Post-op Assessment: Report given to RN, Post -op Vital signs reviewed and stable and Patient moving all extremities  Post vital signs: Reviewed and stable  Last Vitals:  Filed Vitals:   12/29/15 1238 12/29/15 1253  BP: 121/96 122/96  Pulse: 90 90  Temp: 36.6 C 36.6 C  Resp: 18 18    Last Pain: There were no vitals filed for this visit.       Complications: No apparent anesthesia complications

## 2015-12-29 NOTE — Anesthesia Preprocedure Evaluation (Addendum)
Anesthesia Evaluation  Patient identified by MRN, date of birth, ID band Patient awake    Reviewed: Allergy & Precautions, NPO status , Patient's Chart, lab work & pertinent test results  Airway Mallampati: II  TM Distance: >3 FB Neck ROM: Full    Dental  (+) Teeth Intact, Dental Advisory Given   Pulmonary shortness of breath, sleep apnea ,    Pulmonary exam normal        Cardiovascular hypertension, Pt. on medications +CHF   Rhythm:Irregular Rate:Abnormal     Neuro/Psych    GI/Hepatic   Endo/Other    Renal/GU      Musculoskeletal   Abdominal   Peds  Hematology   Anesthesia Other Findings   Reproductive/Obstetrics                            Anesthesia Physical Anesthesia Plan  ASA: III  Anesthesia Plan: General   Post-op Pain Management:    Induction: Intravenous  Airway Management Planned: Mask  Additional Equipment:   Intra-op Plan:   Post-operative Plan:   Informed Consent: I have reviewed the patients History and Physical, chart, labs and discussed the procedure including the risks, benefits and alternatives for the proposed anesthesia with the patient or authorized representative who has indicated his/her understanding and acceptance.   Dental advisory given  Plan Discussed with: CRNA, Anesthesiologist and Surgeon  Anesthesia Plan Comments:        Anesthesia Quick Evaluation

## 2015-12-29 NOTE — Progress Notes (Signed)
Pt in cath holding S/P cardioversion. Awake and oriented but remains sedated and hypotensive. In NSR. Pt's wife at bedside. Plan to observe till BP is in stated baseline range, SBP  90-100 torr. Post care instructions printed and received by pt's wife.

## 2015-12-30 ENCOUNTER — Encounter (HOSPITAL_COMMUNITY): Payer: Self-pay | Admitting: Internal Medicine

## 2016-01-01 ENCOUNTER — Encounter (HOSPITAL_COMMUNITY): Payer: Self-pay | Admitting: Internal Medicine

## 2016-01-03 ENCOUNTER — Telehealth (HOSPITAL_COMMUNITY): Payer: Self-pay | Admitting: *Deleted

## 2016-01-03 NOTE — Telephone Encounter (Signed)
Pt called in stating he has converted back into AFib since last evening. HR is controlled in the 80-90s and he feels ok just knows he is in afib.  Pt had increased his amiodarone back to 400mg  daily but instructed pt (per Rudi Coco) to decrease this back down to 200mg  daily as his TSH was abnormal upon last check. Will have TSH rechecked at office visit with Dr. [name] scheduled for 7/24.  Instructed patient to keep follow up with Dr. [name] and to call back if heart rate starts to be elevated as we can try to adjust another medication to rate control the afib.

## 2016-01-05 ENCOUNTER — Other Ambulatory Visit (HOSPITAL_COMMUNITY): Payer: Self-pay | Admitting: *Deleted

## 2016-01-05 MED ORDER — LISINOPRIL 20 MG PO TABS: 20.0000 mg | ORAL_TABLET | Freq: Two times a day (BID) | ORAL | Status: DC

## 2016-01-17 ENCOUNTER — Telehealth: Payer: Self-pay | Admitting: Internal Medicine

## 2016-01-17 NOTE — Telephone Encounter (Signed)
Returned call to patient and he says he had the same symptoms yesterday he had when he had his TIA.  He had a bad headache, ane abdominal pain.  He went home and went to bed.  Today it occurred to him that this is how he felt when he had his TIA.  He felt fine todd and feels good now.  He has not missed any of his blood thinner.  He has an appointment next week with Dr [name].  If he has another episode he will go to the ER to be evaluated

## 2016-01-17 NOTE — Telephone Encounter (Signed)
New message   Pt said he thinks he had a stroke on yesterday (01/17/16). Please call.

## 2016-01-22 ENCOUNTER — Encounter: Payer: Self-pay | Admitting: Internal Medicine

## 2016-01-22 ENCOUNTER — Ambulatory Visit (INDEPENDENT_AMBULATORY_CARE_PROVIDER_SITE_OTHER): Payer: BLUE CROSS/BLUE SHIELD | Admitting: Internal Medicine

## 2016-01-22 VITALS — BP 110/80 | HR 84 | Ht 74.0 in | Wt 306.8 lb

## 2016-01-22 DIAGNOSIS — I481 Persistent atrial fibrillation: Secondary | ICD-10-CM

## 2016-01-22 DIAGNOSIS — I4819 Other persistent atrial fibrillation: Secondary | ICD-10-CM

## 2016-01-22 LAB — BASIC METABOLIC PANEL
BUN: 17 mg/dL (ref 7–25)
CALCIUM: 9 mg/dL (ref 8.6–10.3)
CO2: 29 mmol/L (ref 20–31)
CREATININE: 1.16 mg/dL (ref 0.70–1.33)
Chloride: 103 mmol/L (ref 98–110)
GLUCOSE: 94 mg/dL (ref 65–99)
Potassium: 4.7 mmol/L (ref 3.5–5.3)
Sodium: 139 mmol/L (ref 135–146)

## 2016-01-22 LAB — CBC WITH DIFFERENTIAL/PLATELET
BASOS ABS: 0 {cells}/uL (ref 0–200)
Basophils Relative: 0 %
EOS PCT: 6 %
Eosinophils Absolute: 282 cells/uL (ref 15–500)
HCT: 40.6 % (ref 38.5–50.0)
Hemoglobin: 13.4 g/dL (ref 13.2–17.1)
Lymphocytes Relative: 24 %
Lymphs Abs: 1128 cells/uL (ref 850–3900)
MCH: 30.9 pg (ref 27.0–33.0)
MCHC: 33 g/dL (ref 32.0–36.0)
MCV: 93.8 fL (ref 80.0–100.0)
MONOS PCT: 12 %
MPV: 10.9 fL (ref 7.5–12.5)
Monocytes Absolute: 564 cells/uL (ref 200–950)
NEUTROS PCT: 58 %
Neutro Abs: 2726 cells/uL (ref 1500–7800)
PLATELETS: 188 10*3/uL (ref 140–400)
RBC: 4.33 MIL/uL (ref 4.20–5.80)
RDW: 13.5 % (ref 11.0–15.0)
WBC: 4.7 10*3/uL (ref 3.8–10.8)

## 2016-01-22 LAB — PROTIME-INR
INR: 1
PROTHROMBIN TIME: 10.8 s (ref 9.0–11.5)

## 2016-01-22 NOTE — Progress Notes (Signed)
PCP: Joseph Garrett is a 55 y.o. male who presents today for electrophysiology follow up with h/o afib ablation x 2, last procedure 08/2012.  When I saw him last, I advised surgical MAZE with Dr Cornelius Moras.  He did not have this Garrett.   His AF has increased.  I cardioverted him several weeks ago however he returned to afib 2 days later. He reports significant reduction in exercise tolerance during afib.  He has SOB and extreme fatigue.  He is clearly much better in sinus. He called our office last week stating that he thought he had had a TIA.  His only symptoms were several hours of nausea, abdominal pain, and headache.  He denies any neurologic symptoms with the event.  He went home and went to sleep and the episode resolved prior to waking.    The patient is otherwise without complaint today.   Past Medical History:  Diagnosis Date  . CHF (congestive heart failure) (HCC)   . Chronic combined systolic and diastolic CHF (congestive heart failure) (HCC)   . Cirrhosis (HCC)    liver scarring on biopsy, presumed to be due to methotrexate  . Depression   . DJD (degenerative joint disease)   . Essential hypertension 08/30/2013  . GERD (gastroesophageal reflux disease)   . Gout   . Hives   . Hypercholesteremia   . Hypertension   . Obesity   . OSA (obstructive sleep apnea)    mild, uses CPAP  . Paroxysmal atrial fibrillation (HCC)   . Persistent atrial fibrillation (HCC) 03/02/2012  . Psoriasis   . Syncope 02/21/2014  . Thrombocytopenia (HCC)   . TIA (transient ischemic attack)    Past Surgical History:  Procedure Laterality Date  . ABLATION OF DYSRHYTHMIC FOCUS  09/17/2012  . ATRIAL FIBRILLATION ABLATION  03/17/12   PVI and CTI ablation by Dr [name]  . ATRIAL FIBRILLATION ABLATION N/A 03/17/2012   Procedure: ATRIAL FIBRILLATION ABLATION;  Surgeon: Hillis Range, MD;  Location: [facility] C[facility] LAB;  Service: Cardiovascular;  Laterality: N/A;  . ATRIAL FIBRILLATION ABLATION N/A 09/18/2012   Procedure: ATRIAL FIBRILLATION ABLATION;  Surgeon: Hillis Range, MD;  Location: [facility] CATH LAB;  Service: Cardiovascular;  Laterality: N/A;  . CARDIOVERSION N/A 10/09/2012   Procedure: CARDIOVERSION;  Surgeon: Laurey Morale, MD;  Location: [facility] ENDOSCOPY;  Service: Cardiovascular;  Laterality: N/A;  . CARDIOVERSION N/A 02/22/2014   Procedure: CARDIOVERSION;  Surgeon: Quintella Reichert, MD;  Location: [facility] ENDOSCOPY;  Service: Cardiovascular;  Laterality: N/A;  . CARDIOVERSION N/A 12/29/2015   Procedure: CARDIOVERSION;  Surgeon: Hillis Range, MD;  Location: [facility] OR;  Service: Cardiovascular;  Laterality: N/A;  . CHOLECYSTECTOMY  2011  . ELECTROPHYSIOLOGIC STUDY N/A 12/29/2015   Procedure: Cardioversion;  Surgeon: Hillis Range, MD;  Location: [facility] INVASIVE CV LAB;  Service: Cardiovascular;  Laterality: N/A;  . NOSE SURGERY     Turbinates  . RECONSTRUCTION OF NOSE    . TEE WITHOUT CARDIOVERSION  03/16/2012   Procedure: TRANSESOPHAGEAL ECHOCARDIOGRAM (TEE);  Surgeon: [name], MD;  Location: [facility] ENDOSCOPY;  Service: Cardiovascular;  Laterality: N/A;  . TEE WITHOUT CARDIOVERSION N/A 09/17/2012   Procedure: TRANSESOPHAGEAL ECHOCARDIOGRAM (TEE);  Surgeon: Lewayne Bunting, MD;  Location: [facility] ENDOSCOPY;  Service: Cardiovascular;  Laterality: N/A;  . TENDON REPAIR     Right Forearm    Current Outpatient Prescriptions  Medication Sig Dispense Refill  . amiodarone (PACERONE) 200 MG tablet Take 200 mg by mouth daily.     Marland Kitchen  carvedilol (COREG) 3.125 MG tablet Take 1 tablet (3.125 mg total) by mouth 2 (two) times daily with a meal. 60 tablet 11  . lisinopril (PRINIVIL,ZESTRIL) 20 MG tablet Take 1 tablet (20 mg total) by mouth 2 (two) times daily. 28 tablet 0  . OVER THE COUNTER MEDICATION Take 2 tablets by mouth 2 (two) times daily. Nerve Fix    . Oxycodone HCl 10 MG TABS Take 10 mg by mouth daily.     Marland Kitchen oxymetazoline (AFRIN) 0.05 % nasal spray Place 2 sprays into both nostrils 2 (two) times daily as needed for  congestion.    . rivaroxaban (XARELTO) 20 MG TABS tablet Take 20 mg by mouth every evening.     . sertraline (ZOLOFT) 100 MG tablet Take 200 mg by mouth every morning.     . simvastatin (ZOCOR) 20 MG tablet Take 1 tablet (20 mg total) by mouth daily. 90 tablet 0   No current facility-administered medications for this visit.     Physical Exam: Vitals:   01/22/16 0851  BP: 110/80  Pulse: 84  Weight: (!) 306 lb 12.8 oz (139.2 kg)  Height:  (1.88 m)    GEN- The patient is well appearing, alert and oriented x 3 today.   Head- normocephalic, atraumatic Eyes-  Sclera clear, conjunctiva pink Ears- hearing intact Oropharynx- clear Lungs- Clear to ausculation bilaterally, normal work of breathing Heart- irregular rate and rhythm, no murmurs, rubs or gallops, PMI not laterally displaced GI- soft, NT, ND, + BS Extremities- no clubbing, cyanosis, or edema  ekg today reveals afib 84 bpm, poor R wave transition, nonspecific ST/T changes  Assessment and Plan:  1.  Persistent afib He continues to have episodic afib despite ablation x 2.   He has failed medical therapy with amiodarone.  Today, we discussed surgical maze.  He wishes now to proceed.  I will arrange RHC/LHC and TEE as previously requested by Dr Cornelius Moras and then refer back to Dr Cornelius Moras. Risks of cath and TEE were discussed in detail with the patient who wishes to proceed.  2. HTN Stable No change required today.   3. OSA Compliance with CPAP advised  4. Nonischemic CM Stable Cath and TEE planned as above  5. Syncope No recurrence Has declined ILR previously  Refer back to Dr Cornelius Moras Follow-up in AF clinic in 2 months  Hillis Range MD, [facility] 01/22/2016 9:37 AM

## 2016-01-22 NOTE — Patient Instructions (Addendum)
Medication Instructions:  Your physician recommends that you continue on your current medications as directed. Please refer to the Current Medication list given to you today.   Labwork: Your physician recommends that you return for lab work today: BMP/cbc   Testing/Procedures:  Your physician has requested that you have a TEE. During a TEE, sound waves are used to create images of your heart. It provides your doctor with information about the size and shape of your heart and how well your heart's chambers and valves are working. In this test, a transducer is attached to the end of a flexible tube that's guided down your throat and into your esophagus (the tube leading from you mouth to your stomach) to get a more detailed image of your heart. You are not awake for the procedure. Please see the instruction sheet given to you today. For further information please visit [email or web address]    Your physician has requested that you have a cardiac catheterization. Cardiac catheterization is used to diagnose and/or treat various heart conditions. Doctors may recommend this procedure for a number of different reasons. The most common reason is to evaluate chest pain. Chest pain can be a symptom of coronary artery disease (CAD), and cardiac catheterization can show whether plaque is narrowing or blocking your heart's arteries. This procedure is also used to evaluate the valves, as well as measure the blood flow and oxygen levels in different parts of your heart. For further information please visit [email or web address] Please follow instruction sheet, as given.  Please arrive at [facility] Entrance at [facility] at 8:30am on 01/25/16 NPO after midnight HOLD Xarelto for 24 hours prior     Follow-Up:  You have been referred to Dr Cornelius Moras for MAZE procedure   Your physician recommends that you schedule a follow-up appointment in: 2 months with Rudi Coco, NP in afib clinic    Any  Other Special Instructions Will Be Listed Below (If Applicable).     If you need a refill on your cardiac medications before your next appointment, please call your pharmacy.

## 2016-01-24 ENCOUNTER — Other Ambulatory Visit: Payer: Self-pay | Admitting: Nurse Practitioner

## 2016-01-25 ENCOUNTER — Ambulatory Visit (HOSPITAL_BASED_OUTPATIENT_CLINIC_OR_DEPARTMENT_OTHER)
Admission: RE | Admit: 2016-01-25 | Discharge: 2016-01-25 | Disposition: A | Discharge status: Home / Self Care | Payer: BLUE CROSS/BLUE SHIELD | Source: Ambulatory Visit | Attending: Nurse Practitioner | Admitting: Nurse Practitioner

## 2016-01-25 ENCOUNTER — Encounter (HOSPITAL_COMMUNITY)
Admission: RE | Disposition: A | Discharge status: Home / Self Care | Payer: Self-pay | Source: Ambulatory Visit | Attending: Cardiology

## 2016-01-25 ENCOUNTER — Encounter (HOSPITAL_COMMUNITY): Payer: Self-pay | Admitting: *Deleted

## 2016-01-25 ENCOUNTER — Ambulatory Visit (HOSPITAL_COMMUNITY)
Admission: RE | Admit: 2016-01-25 | Discharge: 2016-01-25 | Disposition: A | Discharge status: Home / Self Care | Payer: BLUE CROSS/BLUE SHIELD | Source: Ambulatory Visit | Attending: Cardiology | Admitting: Cardiology

## 2016-01-25 DIAGNOSIS — G4733 Obstructive sleep apnea (adult) (pediatric): Secondary | ICD-10-CM | POA: Diagnosis not present

## 2016-01-25 DIAGNOSIS — I481 Persistent atrial fibrillation: Secondary | ICD-10-CM | POA: Insufficient documentation

## 2016-01-25 DIAGNOSIS — I2510 Atherosclerotic heart disease of native coronary artery without angina pectoris: Secondary | ICD-10-CM | POA: Diagnosis not present

## 2016-01-25 DIAGNOSIS — I340 Nonrheumatic mitral (valve) insufficiency: Secondary | ICD-10-CM

## 2016-01-25 DIAGNOSIS — M109 Gout, unspecified: Secondary | ICD-10-CM | POA: Insufficient documentation

## 2016-01-25 DIAGNOSIS — I4891 Unspecified atrial fibrillation: Secondary | ICD-10-CM | POA: Diagnosis not present

## 2016-01-25 DIAGNOSIS — Z7901 Long term (current) use of anticoagulants: Secondary | ICD-10-CM | POA: Insufficient documentation

## 2016-01-25 DIAGNOSIS — Z8673 Personal history of transient ischemic attack (TIA), and cerebral infarction without residual deficits: Secondary | ICD-10-CM | POA: Diagnosis not present

## 2016-01-25 DIAGNOSIS — E669 Obesity, unspecified: Secondary | ICD-10-CM | POA: Insufficient documentation

## 2016-01-25 DIAGNOSIS — I110 Hypertensive heart disease with heart failure: Secondary | ICD-10-CM | POA: Insufficient documentation

## 2016-01-25 DIAGNOSIS — R55 Syncope and collapse: Secondary | ICD-10-CM | POA: Insufficient documentation

## 2016-01-25 DIAGNOSIS — M1990 Unspecified osteoarthritis, unspecified site: Secondary | ICD-10-CM | POA: Diagnosis not present

## 2016-01-25 DIAGNOSIS — I428 Other cardiomyopathies: Secondary | ICD-10-CM | POA: Diagnosis not present

## 2016-01-25 DIAGNOSIS — I5042 Chronic combined systolic (congestive) and diastolic (congestive) heart failure: Secondary | ICD-10-CM | POA: Insufficient documentation

## 2016-01-25 DIAGNOSIS — F329 Major depressive disorder, single episode, unspecified: Secondary | ICD-10-CM | POA: Diagnosis not present

## 2016-01-25 DIAGNOSIS — E7800 Pure hypercholesterolemia, unspecified: Secondary | ICD-10-CM | POA: Diagnosis not present

## 2016-01-25 DIAGNOSIS — K7460 Unspecified cirrhosis of liver: Secondary | ICD-10-CM | POA: Diagnosis not present

## 2016-01-25 DIAGNOSIS — L409 Psoriasis, unspecified: Secondary | ICD-10-CM | POA: Diagnosis not present

## 2016-01-25 DIAGNOSIS — K219 Gastro-esophageal reflux disease without esophagitis: Secondary | ICD-10-CM | POA: Diagnosis not present

## 2016-01-25 HISTORY — PX: TEE WITHOUT CARDIOVERSION: SHX5443

## 2016-01-25 HISTORY — PX: CARDIAC CATHETERIZATION: SHX172

## 2016-01-25 LAB — POCT I-STAT 3, VENOUS BLOOD GAS (G3P V)
ACID-BASE DEFICIT: 4 mmol/L — AB (ref 0.0–2.0)
Acid-base deficit: 3 mmol/L — ABNORMAL HIGH (ref 0.0–2.0)
BICARBONATE: 22.3 meq/L (ref 20.0–24.0)
BICARBONATE: 23 meq/L (ref 20.0–24.0)
O2 SAT: 64 %
O2 Saturation: 65 %
PO2 VEN: 37 mmHg (ref 31.0–45.0)
TCO2: 24 mmol/L (ref 0–100)
TCO2: 24 mmol/L (ref 0–100)
pCO2, Ven: 44.5 mmHg — ABNORMAL LOW (ref 45.0–50.0)
pCO2, Ven: 45.7 mmHg (ref 45.0–50.0)
pH, Ven: 7.307 — ABNORMAL HIGH (ref 7.250–7.300)
pH, Ven: 7.309 — ABNORMAL HIGH (ref 7.250–7.300)
pO2, Ven: 37 mmHg (ref 31.0–45.0)

## 2016-01-25 SURGERY — ECHOCARDIOGRAM, TRANSESOPHAGEAL
Anesthesia: Moderate Sedation

## 2016-01-25 SURGERY — RIGHT/LEFT HEART CATH AND CORONARY ANGIOGRAPHY

## 2016-01-25 MED ORDER — SODIUM CHLORIDE 0.9 % WEIGHT BASED INFUSION: 3.0000 mL/kg/h | INTRAVENOUS | Status: DC

## 2016-01-25 MED ORDER — MIDAZOLAM HCL 2 MG/2ML IJ SOLN
INTRAMUSCULAR | Status: AC
  Filled 2016-01-25: qty 2

## 2016-01-25 MED ORDER — LIDOCAINE HCL (PF) 1 % IJ SOLN
INTRAMUSCULAR | Status: DC | PRN
  Administered 2016-01-25: 5 mL
  Administered 2016-01-25: 2 mL

## 2016-01-25 MED ORDER — ONDANSETRON HCL 4 MG/2ML IJ SOLN: 4.0000 mg | Freq: Four times a day (QID) | INTRAMUSCULAR | Status: DC | PRN

## 2016-01-25 MED ORDER — HEPARIN SODIUM (PORCINE) 1000 UNIT/ML IJ SOLN
INTRAMUSCULAR | Status: AC
  Filled 2016-01-25: qty 1

## 2016-01-25 MED ORDER — SODIUM CHLORIDE 0.9 % IV SOLN: INTRAVENOUS | Status: DC

## 2016-01-25 MED ORDER — SODIUM CHLORIDE 0.9 % IV SOLN: 250.0000 mL | INTRAVENOUS | Status: DC | PRN

## 2016-01-25 MED ORDER — SODIUM CHLORIDE 0.9% FLUSH
3.0000 mL | Freq: Two times a day (BID) | INTRAVENOUS | Status: DC
Start: 2016-01-25 — End: 2016-01-25

## 2016-01-25 MED ORDER — ASPIRIN 81 MG PO CHEW
CHEWABLE_TABLET | ORAL | Status: DC | PRN
  Administered 2016-01-25: 81 mg via ORAL

## 2016-01-25 MED ORDER — FENTANYL CITRATE (PF) 100 MCG/2ML IJ SOLN
INTRAMUSCULAR | Status: DC | PRN
  Administered 2016-01-25 (×2): 25 ug via INTRAVENOUS

## 2016-01-25 MED ORDER — FENTANYL CITRATE (PF) 100 MCG/2ML IJ SOLN
INTRAMUSCULAR | Status: AC
  Filled 2016-01-25: qty 2

## 2016-01-25 MED ORDER — VERAPAMIL HCL 2.5 MG/ML IV SOLN
INTRAVENOUS | Status: AC
  Filled 2016-01-25: qty 2

## 2016-01-25 MED ORDER — HEPARIN (PORCINE) IN NACL 2-0.9 UNIT/ML-% IJ SOLN
INTRAMUSCULAR | Status: DC | PRN
  Administered 2016-01-25: 12:00:00 via INTRA_ARTERIAL

## 2016-01-25 MED ORDER — LIDOCAINE HCL (PF) 1 % IJ SOLN
INTRAMUSCULAR | Status: AC
  Filled 2016-01-25: qty 30

## 2016-01-25 MED ORDER — SODIUM CHLORIDE 0.9% FLUSH: 3.0000 mL | INTRAVENOUS | Status: DC | PRN

## 2016-01-25 MED ORDER — SODIUM CHLORIDE 0.9% FLUSH: 3.0000 mL | Freq: Two times a day (BID) | INTRAVENOUS | Status: DC

## 2016-01-25 MED ORDER — SODIUM CHLORIDE 0.9 % WEIGHT BASED INFUSION: 1.0000 mL/kg/h | INTRAVENOUS | Status: DC

## 2016-01-25 MED ORDER — IOPAMIDOL (ISOVUE-370) INJECTION 76%
INTRAVENOUS | Status: AC
  Filled 2016-01-25: qty 100

## 2016-01-25 MED ORDER — ASPIRIN 81 MG PO CHEW
CHEWABLE_TABLET | ORAL | Status: AC
  Filled 2016-01-25: qty 1

## 2016-01-25 MED ORDER — ACETAMINOPHEN 325 MG PO TABS: 650.0000 mg | ORAL_TABLET | ORAL | Status: DC | PRN

## 2016-01-25 MED ORDER — HEPARIN (PORCINE) IN NACL 2-0.9 UNIT/ML-% IJ SOLN
INTRAMUSCULAR | Status: AC
  Filled 2016-01-25: qty 1000

## 2016-01-25 MED ORDER — BUTAMBEN-TETRACAINE-BENZOCAINE 2-2-14 % EX AERO
INHALATION_SPRAY | CUTANEOUS | Status: DC | PRN
  Administered 2016-01-25: 2 via TOPICAL

## 2016-01-25 MED ORDER — MIDAZOLAM HCL 2 MG/2ML IJ SOLN
INTRAMUSCULAR | Status: DC | PRN
  Administered 2016-01-25 (×2): 1 mg via INTRAVENOUS

## 2016-01-25 MED ORDER — FENTANYL CITRATE (PF) 100 MCG/2ML IJ SOLN
INTRAMUSCULAR | Status: DC | PRN
  Administered 2016-01-25: 25 ug via INTRAVENOUS

## 2016-01-25 MED ORDER — HEPARIN SODIUM (PORCINE) 1000 UNIT/ML IJ SOLN
INTRAMUSCULAR | Status: DC | PRN
  Administered 2016-01-25: 5500 [IU] via INTRAVENOUS

## 2016-01-25 MED ORDER — HEPARIN (PORCINE) IN NACL 2-0.9 UNIT/ML-% IJ SOLN
INTRAMUSCULAR | Status: DC | PRN
  Administered 2016-01-25: 1000 mL

## 2016-01-25 MED ORDER — MIDAZOLAM HCL 10 MG/2ML IJ SOLN
INTRAMUSCULAR | Status: DC | PRN
  Administered 2016-01-25 (×2): 2 mg via INTRAVENOUS

## 2016-01-25 MED ORDER — MIDAZOLAM HCL 5 MG/ML IJ SOLN
INTRAMUSCULAR | Status: AC
  Filled 2016-01-25: qty 2

## 2016-01-25 SURGICAL SUPPLY — 14 items
CATH BALLN WEDGE 5F 110CM (CATHETERS) ×1 IMPLANT
CATH INFINITI 5 FR JL3.5 (CATHETERS) ×1 IMPLANT
CATH INFINITI 5FR ANG PIGTAIL (CATHETERS) ×1 IMPLANT
CATH INFINITI JR4 5F (CATHETERS) ×1 IMPLANT
DEVICE RAD COMP TR BAND LRG (VASCULAR PRODUCTS) ×1 IMPLANT
GLIDESHEATH SLEND SS 6F .021 (SHEATH) ×1 IMPLANT
KIT HEART LEFT (KITS) ×2 IMPLANT
KIT HEART RIGHT NAMIC (KITS) ×2 IMPLANT
PACK CARDIAC CATHETERIZATION (CUSTOM PROCEDURE TRAY) ×2 IMPLANT
SHEATH FAST CATH BRACH 5F 5CM (SHEATH) ×1 IMPLANT
SYR MEDRAD MARK V 150ML (SYRINGE) ×2 IMPLANT
TRANSDUCER W/STOPCOCK (MISCELLANEOUS) ×3 IMPLANT
TUBING CIL FLEX 10 FLL-RA (TUBING) ×2 IMPLANT
WIRE SAFE-T 1.5MM-J .035X260CM (WIRE) ×1 IMPLANT

## 2016-01-25 NOTE — Progress Notes (Signed)
The right brachial venous sheath was removed and pressure held for 12 minutes. No apparent complications. Sheath site level 0. Right radial pulse intact.

## 2016-01-25 NOTE — Interval H&P Note (Signed)
History and Physical Interval Note:  01/25/2016 11:37 AM  Joseph Garrett  has presented today for surgery, with the diagnosis of afib - shortness of breath  The various methods of treatment have been discussed with the patient and family. After consideration of risks, benefits and other options for treatment, the patient has consented to  Procedure(s): Right/Left Heart Cath and Coronary Angiography (N/A) as a surgical intervention .  The patient's history has been reviewed, patient examined, no change in status, stable for surgery.  I have reviewed the patient's chart and labs.  Questions were answered to the patient's satisfaction.     Dalton [employer]

## 2016-01-25 NOTE — CV Procedure (Signed)
Procedure: TEE  Indication: Atrial fibrillation, pre-Maze.  Sedation: Versed 4 mg IV, Fentanyl 50 mcg IV  Findings: Please see echo section for full report.  The patient was in atrial fibrillation.  Normal LV size with EF mildly reduced globally, estimated EF 45-50%.  Normal RV size and systolic function.  Trivial TR.  Mild to moderate mitral regurgitation.  Mild left atrial enlargement, no LA appendage thrombus.  Normal right atrium.  Trileaflet aortic valve with no stenosis or regurgitation.   No PFO/ASD, negative bubble study.  Normal caliber thoracic aorta with minimal plaque.   May proceed to cath now.   Marca Ancona 01/25/2016 10:30 AM

## 2016-01-25 NOTE — Discharge Instructions (Signed)
Radial Site Care °Refer to this sheet in the next few weeks. These instructions provide you with information about caring for yourself after your procedure. Your health care provider may also give you more specific instructions. Your treatment has been planned according to current medical practices, but problems sometimes occur. Call your health care provider if you have any problems or questions after your procedure. °WHAT TO EXPECT AFTER THE PROCEDURE °After your procedure, it is typical to have the following: °· Bruising at the radial site that usually fades within 1-2 weeks. °· Blood collecting in the tissue (hematoma) that may be painful to the touch. It should usually decrease in size and tenderness within 1-2 weeks. °HOME CARE INSTRUCTIONS °· Take medicines only as directed by your health care provider. °· You may shower 24-48 hours after the procedure or as directed by your health care provider. Remove the bandage (dressing) and gently wash the site with plain soap and water. Pat the area dry with a clean towel. Do not rub the site, because this may cause bleeding. °· Do not take baths, swim, or use a hot tub until your health care provider approves. °· Check your insertion site every day for redness, swelling, or drainage. °· Do not apply powder or lotion to the site. °· Do not flex or bend the affected arm for 24 hours or as directed by your health care provider. °· Do not push or pull heavy objects with the affected arm for 24 hours or as directed by your health care provider. °· Do not lift over 10 lb (4.5 kg) for 5 days after your procedure or as directed by your health care provider. °· Ask your health care provider when it is okay to: °¨ Return to work or school. °¨ Resume usual physical activities or sports. °¨ Resume sexual activity. °· Do not drive home if you are discharged the same day as the procedure. Have someone else drive you. °· You may drive 24 hours after the procedure unless otherwise  instructed by your health care provider. °· Do not operate machinery or power tools for 24 hours after the procedure. °· If your procedure was done as an outpatient procedure, which means that you went home the same day as your procedure, a responsible adult should be with you for the first 24 hours after you arrive home. °· Keep all follow-up visits as directed by your health care provider. This is important. °SEEK MEDICAL CARE IF: °· You have a fever. °· You have chills. °· You have increased bleeding from the radial site. Hold pressure on the site. CALL 911 °SEEK IMMEDIATE MEDICAL CARE IF: °· You have unusual pain at the radial site. °· You have redness, warmth, or swelling at the radial site. °· You have drainage (other than a small amount of blood on the dressing) from the radial site. °· The radial site is bleeding, and the bleeding does not stop after 30 minutes of holding steady pressure on the site. °· Your arm or hand becomes pale, cool, tingly, or numb. °  °This information is not intended to replace advice given to you by your health care provider. Make sure you discuss any questions you have with your health care provider. °  °Document Released: 07/20/2010 Document Revised: 07/08/2014 Document Reviewed: 01/03/2014 °Elsevier Interactive Patient Education ©2016 [employer] ° °

## 2016-01-25 NOTE — H&P (View-Only) (Signed)
PCP: Joseph Garrett is a 55 y.o. male who presents today for electrophysiology follow up with h/o afib ablation x 2, last procedure 08/2012.  When I saw him last, I advised surgical MAZE with Dr Cornelius Moras.  He did not have this Garrett.   His AF has increased.  I cardioverted him several weeks ago however he returned to afib 2 days later. He reports significant reduction in exercise tolerance during afib.  He has SOB and extreme fatigue.  He is clearly much better in sinus. He called our office last week stating that he thought he had had a TIA.  His only symptoms were several hours of nausea, abdominal pain, and headache.  He denies any neurologic symptoms with the event.  He went home and went to sleep and the episode resolved prior to waking.    The patient is otherwise without complaint today.   Past Medical History:  Diagnosis Date  . CHF (congestive heart failure) (HCC)   . Chronic combined systolic and diastolic CHF (congestive heart failure) (HCC)   . Cirrhosis (HCC)    liver scarring on biopsy, presumed to be due to methotrexate  . Depression   . DJD (degenerative joint disease)   . Essential hypertension 08/30/2013  . GERD (gastroesophageal reflux disease)   . Gout   . Hives   . Hypercholesteremia   . Hypertension   . Obesity   . OSA (obstructive sleep apnea)    mild, uses CPAP  . Paroxysmal atrial fibrillation (HCC)   . Persistent atrial fibrillation (HCC) 03/02/2012  . Psoriasis   . Syncope 02/21/2014  . Thrombocytopenia (HCC)   . TIA (transient ischemic attack)    Past Surgical History:  Procedure Laterality Date  . ABLATION OF DYSRHYTHMIC FOCUS  09/17/2012  . ATRIAL FIBRILLATION ABLATION  03/17/12   PVI and CTI ablation by Dr [name]  . ATRIAL FIBRILLATION ABLATION N/A 03/17/2012   Procedure: ATRIAL FIBRILLATION ABLATION;  Surgeon: Hillis Range, MD;  Location: [facility] CATH LAB;  Service: Cardiovascular;  Laterality: N/A;  . ATRIAL FIBRILLATION ABLATION N/A 09/18/2012   Procedure: ATRIAL FIBRILLATION ABLATION;  Surgeon: Hillis Range, MD;  Location: [facility] CATH LAB;  Service: Cardiovascular;  Laterality: N/A;  . CARDIOVERSION N/A 10/09/2012   Procedure: CARDIOVERSION;  Surgeon: Laurey Morale, MD;  Location: [facility] ENDOSCOPY;  Service: Cardiovascular;  Laterality: N/A;  . CARDIOVERSION N/A 02/22/2014   Procedure: CARDIOVERSION;  Surgeon: Quintella Reichert, MD;  Location: [facility] ENDOSCOPY;  Service: Cardiovascular;  Laterality: N/A;  . CARDIOVERSION N/A 12/29/2015   Procedure: CARDIOVERSION;  Surgeon: Hillis Range, MD;  Location: [facility] OR;  Service: Cardiovascular;  Laterality: N/A;  . CHOLECYSTECTOMY  2011  . ELECTROPHYSIOLOGIC STUDY N/A 12/29/2015   Procedure: Cardioversion;  Surgeon: Hillis Range, MD;  Location: [facility] INVASIVE CV LAB;  Service: Cardiovascular;  Laterality: N/A;  . NOSE SURGERY     Turbinates  . RECONSTRUCTION OF NOSE    . TEE WITHOUT CARDIOVERSION  03/16/2012   Procedure: TRANSESOPHAGEAL ECHOCARDIOGRAM (TEE);  Surgeon: [name], MD;  Location: [facility] ENDOSCOPY;  Service: Cardiovascular;  Laterality: N/A;  . TEE WITHOUT CARDIOVERSION N/A 09/17/2012   Procedure: TRANSESOPHAGEAL ECHOCARDIOGRAM (TEE);  Surgeon: Lewayne Bunting, MD;  Location: [facility] ENDOSCOPY;  Service: Cardiovascular;  Laterality: N/A;  . TENDON REPAIR     Right Forearm    Current Outpatient Prescriptions  Medication Sig Dispense Refill  . amiodarone (PACERONE) 200 MG tablet Take 200 mg by mouth daily.     Marland Kitchen  carvedilol (COREG) 3.125 MG tablet Take 1 tablet (3.125 mg total) by mouth 2 (two) times daily with a meal. 60 tablet 11  . lisinopril (PRINIVIL,ZESTRIL) 20 MG tablet Take 1 tablet (20 mg total) by mouth 2 (two) times daily. 28 tablet 0  . OVER THE COUNTER MEDICATION Take 2 tablets by mouth 2 (two) times daily. Nerve Fix    . Oxycodone HCl 10 MG TABS Take 10 mg by mouth daily.     . oxymetazoline (AFRIN) 0.05 % nasal spray Place 2 sprays into both nostrils 2 (two) times daily as needed for  congestion.    . rivaroxaban (XARELTO) 20 MG TABS tablet Take 20 mg by mouth every evening.     . sertraline (ZOLOFT) 100 MG tablet Take 200 mg by mouth every morning.     . simvastatin (ZOCOR) 20 MG tablet Take 1 tablet (20 mg total) by mouth daily. 90 tablet 0   No current facility-administered medications for this visit.     Physical Exam: Vitals:   01/22/16 0851  BP: 110/80  Pulse: 84  Weight: (!) 306 lb 12.8 oz (139.2 kg)  Height: 6' 2" (1.88 m)    GEN- The patient is well appearing, alert and oriented x 3 today.   Head- normocephalic, atraumatic Eyes-  Sclera clear, conjunctiva pink Ears- hearing intact Oropharynx- clear Lungs- Clear to ausculation bilaterally, normal work of breathing Heart- irregular rate and rhythm, no murmurs, rubs or gallops, PMI not laterally displaced GI- soft, NT, ND, + BS Extremities- no clubbing, cyanosis, or edema  ekg today reveals afib 84 bpm, poor R wave transition, nonspecific ST/T changes  Assessment and Plan:  1.  Persistent afib He continues to have episodic afib despite ablation x 2.   He has failed medical therapy with amiodarone.  Today, we discussed surgical maze.  He wishes now to proceed.  I will arrange RHC/LHC and TEE as previously requested by Dr Owen and then refer back to Dr Owen. Risks of cath and TEE were discussed in detail with the patient who wishes to proceed.  2. HTN Stable No change required today.   3. OSA Compliance with CPAP advised  4. Nonischemic CM Stable Cath and TEE planned as above  5. Syncope No recurrence Has declined ILR previously  Refer back to Dr Owen Follow-up in AF clinic in 2 months  Roselind Klus MD, FACC 01/22/2016 9:37 AM   

## 2016-01-25 NOTE — Research (Signed)
LEADERS FREE Informed Consent   Subject Name: Joseph Garrett  Subject met inclusion and exclusion criteria.  The informed consent form, study requirements and expectations were reviewed with the subject and questions and concerns were addressed prior to the signing of the consent form.  The subject verbalized understanding of the trail requirements.  The subject agreed to participate in the LEADERS Free trial and signed the informed consent.  The informed consent was obtained prior to performance of any protocol-specific procedures for the subject.  A copy of the signed informed consent was given to the subject and a copy was placed in the subject's medical record.  [name] 01/25/2016, 9:20

## 2016-01-25 NOTE — Interval H&P Note (Signed)
History and Physical Interval Note:  01/25/2016 10:14 AM  Joseph Garrett  has presented today for surgery, with the diagnosis of sOB and a-fib  The various methods of treatment have been discussed with the patient and family. After consideration of risks, benefits and other options for treatment, the patient has consented to  Procedure(s): TRANSESOPHAGEAL ECHOCARDIOGRAM (TEE) (N/A) as a surgical intervention .  The patient's history has been reviewed, patient examined, no change in status, stable for surgery.  I have reviewed the patient's chart and labs.  Questions were answered to the patient's satisfaction.     Donya Tomaro [employer]

## 2016-01-26 ENCOUNTER — Encounter (HOSPITAL_COMMUNITY): Payer: Self-pay | Admitting: Cardiology

## 2016-01-29 ENCOUNTER — Other Ambulatory Visit: Payer: Self-pay | Admitting: Internal Medicine

## 2016-01-31 ENCOUNTER — Encounter: Payer: BLUE CROSS/BLUE SHIELD | Admitting: Surgery

## 2016-02-01 ENCOUNTER — Institutional Professional Consult (permissible substitution) (INDEPENDENT_AMBULATORY_CARE_PROVIDER_SITE_OTHER): Payer: BLUE CROSS/BLUE SHIELD | Admitting: Thoracic Surgery (Cardiothoracic Vascular Surgery)

## 2016-02-01 ENCOUNTER — Other Ambulatory Visit: Payer: Self-pay | Admitting: Internal Medicine

## 2016-02-01 ENCOUNTER — Other Ambulatory Visit: Payer: Self-pay | Admitting: *Deleted

## 2016-02-01 ENCOUNTER — Encounter: Payer: Self-pay | Admitting: Thoracic Surgery (Cardiothoracic Vascular Surgery)

## 2016-02-01 VITALS — BP 125/86 | HR 96 | Resp 20 | Ht 74.0 in | Wt 295.0 lb

## 2016-02-01 DIAGNOSIS — I481 Persistent atrial fibrillation: Secondary | ICD-10-CM

## 2016-02-01 DIAGNOSIS — I5042 Chronic combined systolic (congestive) and diastolic (congestive) heart failure: Secondary | ICD-10-CM | POA: Diagnosis not present

## 2016-02-01 DIAGNOSIS — I480 Paroxysmal atrial fibrillation: Secondary | ICD-10-CM

## 2016-02-01 DIAGNOSIS — I340 Nonrheumatic mitral (valve) insufficiency: Secondary | ICD-10-CM | POA: Diagnosis not present

## 2016-02-01 DIAGNOSIS — I4819 Other persistent atrial fibrillation: Secondary | ICD-10-CM

## 2016-02-01 DIAGNOSIS — Z01818 Encounter for other preprocedural examination: Secondary | ICD-10-CM

## 2016-02-01 NOTE — Progress Notes (Signed)
[address]       [place] [address]             [phone]     CARDIOTHORACIC SURGERY CONSULTATION REPORT  Referring Provider is Hillis Range, MD PCP is Roxanne Mins, PA-C  Chief Complaint  Patient presents with  . Atrial Fibrillation    Surgical eval for MAZE procedure, Cardiac Cath 01/15/16    HPI:  Patient is a 55 year old obese white male with long-standing history of atrial fibrillation, chronic combined systolic and diastolic congestive heart failure, hypertension, hypercholesterolemia, and obstructive sleep apnea who has been referred for surgical consultation to discuss treatment options for management of recurrent persistent atrial fibrillation which has failed medical therapy on multiple agents and catheter-based ablation on 2 previous occasions.  The patient states that he was initially diagnosed with paroxysmal atrial fibrillation more than 20 years ago.  Over the years he developed increasing frequency and duration of symptoms of tachycardia palpitations and shortness of breath that occurred while the patient was in atrial fibrillation. He was followed for a period of time by Dr. Shelva Majestic and treated with digoxin and Coumadin. He was treated with flecainide for at least 3 years. Ultimately he was referred to Dr. [name] and underwent catheter-based ablation in September 2013. He initially did well but he developed recurrent persistent atrial fibrillation within 6 months. He underwent repeat catheter-based ablation and March 2014.  Unfortunately he again developed early recurrence of atrial fibrillation. He was started on amiodarone and has undergone DC cardioversion on numerous occasions.  He continued to have episodes of recurrent persistent atrial fibrillation associated with symptoms of shortness of breath, dizzy spells, and 2 brief syncopal episodes. He was referred for surgical consultation in November 2015. We discussed the risks and benefits of  surgical Maze procedure at that time, but the patient decided to hold off as he was maintaining sinus rhythm on amiodarone at that time.  The patient states that several months ago he began to experience worsening symptoms of exertional shortness of breath and fatigue. He was seen in follow-up recently by Dr. [name] and the possibility of reconsidering surgical ablation was discussed.  He underwent repeat cardioversion on 12/29/2015. The patient initially went back into sinus rhythm but again returned to atrial fibrillation. Since then the patient subsequently underwent TEE and diagnostic cardiac catheterization by Dr. Shirlee Latch on 01/25/2016. Transesophageal echocardiogram revealed stable mild left ventricular systolic dysfunction with ejection fraction estimated 45-50%. There was mild to moderate central mitral regurgitation with mild left atrial enlargement and no thrombus in the left atrial appendage.  Diagnostic catheterization revealed mild nonobstructive coronary artery disease with mild left ventricular systolic dysfunction and normal right-sided pressures. The patient was referred for surgical consultation.  The patient is married and lives locally in [place] with his wife. They have 3 grown children. He works full-time as a Paramedic.  In the past he enjoyed karate but he has not been practicing for quite some time now.  The patient complains of a several month history of exertional shortness of breath. He now gets short of breath with mild to moderate level activity such as going up a single flight of stairs. His exertional shortness of breath limits his daily activities to a significant degree. He denies resting shortness of breath, PND, orthopnea. He has never had any chest pain or chest tightness. He has frequent palpitations. He has had occasional dizzy spells and  2 previous syncopal episodes, although last syncopal episode was more than  one year ago.   Past Medical History:  Diagnosis Date  . CHF (congestive heart failure) (HCC)   . Chronic combined systolic and diastolic CHF (congestive heart failure) (HCC)   . Cirrhosis (HCC)    liver scarring on biopsy, presumed to be due to methotrexate  . Depression   . DJD (degenerative joint disease)   . Essential hypertension 08/30/2013  . GERD (gastroesophageal reflux disease)   . Gout   . Hives   . Hypercholesteremia   . Hypertension   . Mitral regurgitation - type I dysfunction - moderate by TEE   . Obesity   . OSA (obstructive sleep apnea)    mild, uses CPAP  . Paroxysmal atrial fibrillation (HCC)   . Persistent atrial fibrillation (HCC) 03/02/2012  . Psoriasis   . Syncope 02/21/2014  . Thrombocytopenia (HCC)   . TIA (transient ischemic attack)     Past Surgical History:  Procedure Laterality Date  . ABLATION OF DYSRHYTHMIC FOCUS  09/17/2012  . ATRIAL FIBRILLATION ABLATION  03/17/12   PVI and CTI ablation by Dr [name]  . ATRIAL FIBRILLATION ABLATION N/A 03/17/2012   Procedure: ATRIAL FIBRILLATION ABLATION;  Surgeon: Hillis Range, MD;  Location: [facility] CATH LAB;  Service: Cardiovascular;  Laterality: N/A;  . ATRIAL FIBRILLATION ABLATION N/A 09/18/2012   Procedure: ATRIAL FIBRILLATION ABLATION;  Surgeon: Hillis Range, MD;  Location: [facility] CATH LAB;  Service: Cardiovascular;  Laterality: N/A;  . CARDIAC CATHETERIZATION N/A 01/25/2016   Procedure: Right/Left Heart Cath and Coronary Angiography;  Surgeon: Laurey Morale, MD;  Location: [facility] INVASIVE CV LAB;  Service: Cardiovascular;  Laterality: N/A;  . CARDIOVERSION N/A 10/09/2012   Procedure: CARDIOVERSION;  Surgeon: Laurey Morale, MD;  Location: [facility] ENDOSCOPY;  Service: Cardiovascular;  Laterality: N/A;  . CARDIOVERSION N/A 02/22/2014   Procedure: CARDIOVERSION;  Surgeon: Quintella Reichert, MD;  Location: [facility] ENDOSCOPY;  Service: Cardiovascular;  Laterality: N/A;  . CARDIOVERSION N/A 12/29/2015   Procedure: CARDIOVERSION;  Surgeon:  Hillis Range, MD;  Location: [facility] OR;  Service: Cardiovascular;  Laterality: N/A;  . CHOLECYSTECTOMY  2011  . ELECTROPHYSIOLOGIC STUDY N/A 12/29/2015   Procedure: Cardioversion;  Surgeon: Hillis Range, MD;  Location: [facility] INVASIVE CV LAB;  Service: Cardiovascular;  Laterality: N/A;  . NOSE SURGERY     Turbinates  . RECONSTRUCTION OF NOSE    . TEE WITHOUT CARDIOVERSION  03/16/2012   Procedure: TRANSESOPHAGEAL ECHOCARDIOGRAM (TEE);  Surgeon: [name], MD;  Location: [facility] ENDOSCOPY;  Service: Cardiovascular;  Laterality: N/A;  . TEE WITHOUT CARDIOVERSION N/A 09/17/2012   Procedure: TRANSESOPHAGEAL ECHOCARDIOGRAM (TEE);  Surgeon: Lewayne Bunting, MD;  Location: [facility] ENDOSCOPY;  Service: Cardiovascular;  Laterality: N/A;  . TEE WITHOUT CARDIOVERSION N/A 01/25/2016   Procedure: TRANSESOPHAGEAL ECHOCARDIOGRAM (TEE);  Surgeon: Laurey Morale, MD;  Location: [facility] ENDOSCOPY;  Service: Cardiovascular;  Laterality: N/A;  . TENDON REPAIR     Right Forearm    Family History  Problem Relation Age of Onset  . Heart attack Father   . Heart disease    . Diabetes      Social History   Social History  . Marital status: Married    Spouse name: N/A  . Number of children: N/A  . Years of education: N/A   Occupational History  . Not on file.   Social History Main Topics  . Smoking status: Never Smoker  . Smokeless tobacco: Never Used  . Alcohol use No  .  Drug use: No  . Sexual activity: Not on file   Other Topics Concern  . Not on file   Social History Narrative   Lives in [place] with spouse.  3 children are healthy.     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    Current Outpatient Prescriptions  Medication Sig Dispense Refill  . amiodarone (PACERONE) 200 MG tablet TAKE 1 TABLET DAILY 180 tablet 3  . carvedilol (COREG) 3.125 MG tablet Take 1 tablet (3.125 mg total) by mouth 2 (two) times daily with a meal. 60 tablet 11  . lisinopril (PRINIVIL,ZESTRIL) 20 MG tablet Take 1  tablet (20 mg total) by mouth 2 (two) times daily. 28 tablet 0  . Oxycodone HCl 10 MG TABS Take 10 mg by mouth daily.     Marland Kitchen oxymetazoline (AFRIN) 0.05 % nasal spray Place 2 sprays into both nostrils 2 (two) times daily as needed for congestion.    . rivaroxaban (XARELTO) 20 MG TABS tablet Take 20 mg by mouth every evening.     . sertraline (ZOLOFT) 100 MG tablet Take 150 mg by mouth every morning.     . simvastatin (ZOCOR) 20 MG tablet TAKE 1 TABLET DAILY 90 tablet 3   No current facility-administered medications for this visit.     Allergies  Allergen Reactions  . Penicillins Rash    Has patient had a PCN reaction causing immediate rash, facial/tongue/throat swelling, SOB or lightheadedness with hypotension: Yes Has patient had a PCN reaction causing severe rash involving mucus membranes or skin necrosis: No Has patient had a PCN reaction that required hospitalization No Has patient had a PCN reaction occurring within the last 10 years: No If all of the above answers are "NO", then may proceed with Cephalosporin use.       Review of Systems:   General:  normal appetite, decreased energy, no weight gain, + intentional 10 lb weight loss, no fever  Cardiac:  no chest pain with exertion, no chest pain at rest, +SOB with exertion, no resting SOB, no PND, no orthopnea, + palpitations, + arrhythmia, + atrial fibrillation, + LE edema, + dizzy spells, + syncope  Respiratory:  + shortness of breath, no home oxygen, + productive cough, no dry cough, no bronchitis, no wheezing, no hemoptysis, no asthma, no pain with inspiration or cough, + sleep apnea, + CPAP at night  GI:   no difficulty swallowing, no reflux, no frequent heartburn, no hiatal hernia, no abdominal pain, no constipation, no diarrhea, no hematochezia, no hematemesis, no melena  GU:   no dysuria,  + frequency, no urinary tract infection, no hematuria, + enlarged prostate, no kidney stones, no kidney disease  Vascular:  no pain  suggestive of claudication, no pain in feet, no leg cramps, no varicose veins, no DVT, no non-healing foot ulcer  Neuro:   no stroke, ? recent TIA's, no seizures, no headaches, no temporary blindness one eye,  no slurred speech, no peripheral neuropathy, + chronic pain especially right great toe, no instability of gait, no memory/cognitive dysfunction  Musculoskeletal: + arthritis, + joint swelling, no myalgias, no difficulty walking, normal mobility   Skin:   + rash of psoriasis, no itching, no skin infections, no pressure sores or ulcerations  Psych:   no anxiety, + depression, no nervousness, no unusual recent stress  Eyes:   no blurry vision, no floaters, no recent vision changes, does not wear glasses or contacts  ENT:   no hearing loss, no loose  or painful teeth, no dentures, last saw dentist several years ago  Hematologic:  + easy bruising, + abnormal bleeding, no clotting disorder, no frequent epistaxis  Endocrine:  no diabetes, does not check CBG's at home     Physical Exam:   BP 125/86 (BP Location: Right Arm, Patient Position: Sitting, Cuff Size: Large)   Pulse 96   Resp 20   Ht 6\' 2"  (1.88 m)   Wt 295 lb (133.8 kg)   SpO2 98% Comment: RA  BMI 37.88 kg/m   General:  Moderately obese,  well-appearing  HEENT:  Unremarkable   Neck:   no JVD, no bruits, no adenopathy   Chest:   clear to auscultation, symmetrical breath sounds, no wheezes, no rhonchi   CV:   Irregular rate and rhythm, no murmur   Abdomen:  soft, non-tender, no masses   Extremities:  warm, well-perfused, pulses diminished, no LE edema  Rectal/GU  Deferred  Neuro:   Grossly non-focal and symmetrical throughout  Skin:   Clean and dry, no rashes, no breakdown   Diagnostic Tests:  Procedure: TEE  Indication: Atrial fibrillation, pre-Maze.  Sedation: Versed 4 mg IV, Fentanyl 50 mcg IV  Findings: Please see echo section for full report.  The patient was in atrial fibrillation.  Normal LV size with EF mildly  reduced globally, estimated EF 45-50%.  Normal RV size and systolic function.  Trivial TR.  Mild to moderate mitral regurgitation.  Mild left atrial enlargement, no LA appendage thrombus.  Normal right atrium.  Trileaflet aortic valve with no stenosis or regurgitation.   No PFO/ASD, negative bubble study.  Normal caliber thoracic aorta with minimal plaque.   May proceed to cath now.   Marca Ancona 01/25/2016 10:30 AM    Right/Left Heart Cath and Coronary Angiography   Conclusion  1. Normal filling pressures.  2. Nonobstructive coronary disease.  3. EF 50-55%.   Procedural Details/Technique   Technical Details Procedure: Right Heart Cath, Left Heart Cath, Selective Coronary Angiography, LV angiography  Indication: Atrial fibrillation, pre-Maze surgery.   Procedural Details: The right brachial and radial areas were prepped, draped, and anesthetized with 1% lidocaine. There was a pre-existing peripheral IV in the right brachial area that was replaced with a 45F sheath. A Swan-Ganz catheter was used for the right heart catheterization. Standard protocol was followed for recording of right heart pressures and sampling of oxygen saturations. Fick cardiac output was calculated. The right radial artery was entered using modified Seldinger technique and a 7F sheath was placed. The patient received 3 mg IA verapamil and weight-based IV heparin. Standard Judkins catheters were used for selective coronary angiography and left ventriculography. There were no immediate procedural complications. The patient was transferred to the post catheterization recovery area for further monitoring.  During this procedure the patient is administered a total of Versed 2 mg and Fentanyl 50 mg to achieve and maintain moderate conscious sedation. The patient's heart rate, blood pressure, and oxygen saturation are monitored continuously during the procedure. The period of conscious sedation is 28 minutes, of which I was  present face-to-face 100% of this time.   Estimated blood loss <50 mL. .    Coronary Findings   Dominance: Right  Left Main  30% distal LM stenosis.  Left Anterior Descending  Luminal irregularities.  Ramus Intermedius  Large, no significant disease.  Left Circumflex  No significant disease.  Right Coronary Artery  No significant disease.  Right Heart   Right Heart Pressures [facility] Procedural Findings:  Hemodynamics (mmHg) RA mean 6 RV 31/6 PA 33/9, mean 22 PCWP mean 10 LV 105/11 AO 110/69  Oxygen saturations: PA 65% AO 98%  Cardiac Output (Fick) 5.75  Cardiac Index (Fick) 2.21    Wall Motion   EF 50-55%, mild diffuse hypokinesis.         Implants     No implant documentation for this case.  PACS Images   Show images for Cardiac catheterization   Link to Procedure Log   Procedure Log    Hemo Data   Flowsheet Row Most Recent Value  Fick Cardiac Output 5.75 L/min  Fick Cardiac Output Index 2.21 (L/min)/BSA  RA A Wave 8 mmHg  RA V Wave 8 mmHg  RA Mean 6 mmHg  RV Systolic Pressure 31 mmHg  RV Diastolic Pressure 2 mmHg  RV EDP 6 mmHg  PA Systolic Pressure 33 mmHg  PA Diastolic Pressure 9 mmHg  PA Mean 22 mmHg  PW A Wave 13 mmHg  PW V Wave 17 mmHg  PW Mean 10 mmHg  AO Systolic Pressure 110 mmHg  AO Diastolic Pressure 69 mmHg  AO Mean 92 mmHg  LV Systolic Pressure 105 mmHg  LV Diastolic Pressure 5 mmHg  LV EDP 11 mmHg  Arterial Occlusion Pressure Extended Systolic Pressure 106 mmHg  Arterial Occlusion Pressure Extended Diastolic Pressure 72 mmHg  Arterial Occlusion Pressure Extended Mean Pressure 88 mmHg  Left Ventricular Apex Extended Systolic Pressure 105 mmHg  Left Ventricular Apex Extended Diastolic Pressure 5 mmHg  Left Ventricular Apex Extended EDP Pressure 12 mmHg  QP/QS 1  TPVR Index 9.95 HRUI  TSVR Index 41.6 HRUI  PVR SVR Ratio 0.14  TPVR/TSVR Ratio 0.24     Impression:  Patient has long-standing history of atrial fibrillation  that dates back more than 20 years ago. Recently he has had worsening symptoms of exertional shortness of breath, fatigue, and palpitations in the setting of recurrent persistent atrial fibrillation that has failed medical therapy with multiple agents and catheter-based ablation on 2 previous occasions.  Comorbid medical conditions include history of chronic combined systolic and diastolic congestive heart failure, obesity, obstructive sleep apnea, and hypertension.  I have personally reviewed the patient's recent transesophageal echocardiogram and diagnostic catheterization. TEE reveals type I dysfunction of the mitral valve with moderate central mitral regurgitation.  There is mild left atrial enlargement and mild left ventricular hypertrophy. Diagnostic cardiac catheterization is notable for the absence of significant coronary artery disease. Options include long-term medical therapy versus surgical ablation. I feel the patient is an acceptable candidate for Maze procedure.   Plan:  The patient and his wife were counseled at length regarding the indications, risks and potential benefits of maze procedure.  The rationale for elective surgery has been explained, including a comparison between surgery and continued medical therapy.  The relative risks and benefits of performing a maze procedure was discussed at length, including the expected likelihood of long term freedom from recurrent symptomatic atrial fibrillation and/or atrial flutter as well as the risks of possible need for permanent pacemaker placement.  Alternative surgical approaches have been discussed including a comparison between conventional sternotomy and minimally-invasive techniques.  The relative risks and benefits of each have been reviewed as they pertain to the patient's specific circumstances, and all of their questions have been addressed.  We also discussed the fact that the patient has at least mild to moderate mitral regurgitation  secondary to type I mitral valve dysfunction. Under the circumstances we would plan to reevaluate  the severity of mitral regurgitation at the time of surgery and proceed with mitral valve repair (ring annuloplasty) if necessary. The patient hopes to proceed with surgery sometime in September.  The patient will undergo CT angiography of the aorta and iliac vessels to evaluate the feasibility of peripheral arterial cannulation for surgery.  We tentatively plan to proceed with surgery on Wednesday, 03/20/2016. The patient has been instructed to stop taking Xarelto one week prior to surgery. He will return to our office on Monday, 03/18/2016 for follow-up prior to surgery.    I spent in excess of 90 minutes during the conduct of this office consultation and >50% of this time involved direct face-to-face encounter with the patient for counseling and/or coordination of their care.   Salvatore Decent. Cornelius Moras, MD 02/01/2016 12:40 PM

## 2016-02-01 NOTE — Patient Instructions (Signed)
Schedule CT angiogram any time before Monday 9/18  Stop taking Xarelto on Wednesday 9/13  Continue taking all other medications without change through the day before surgery.  Have nothing to eat or drink after midnight the night before surgery.  On the morning of surgery take only Coreg (carvedilol) with a sip of water.

## 2016-02-02 ENCOUNTER — Encounter: Payer: BLUE CROSS/BLUE SHIELD | Admitting: Thoracic Surgery (Cardiothoracic Vascular Surgery)

## 2016-02-05 ENCOUNTER — Other Ambulatory Visit: Payer: Self-pay | Admitting: *Deleted

## 2016-02-05 DIAGNOSIS — I4891 Unspecified atrial fibrillation: Secondary | ICD-10-CM

## 2016-02-05 DIAGNOSIS — I340 Nonrheumatic mitral (valve) insufficiency: Secondary | ICD-10-CM

## 2016-02-06 ENCOUNTER — Other Ambulatory Visit: Payer: Self-pay | Admitting: *Deleted

## 2016-02-06 DIAGNOSIS — I4891 Unspecified atrial fibrillation: Secondary | ICD-10-CM

## 2016-02-06 DIAGNOSIS — I340 Nonrheumatic mitral (valve) insufficiency: Secondary | ICD-10-CM

## 2016-02-09 ENCOUNTER — Ambulatory Visit
Admission: RE | Admit: 2016-02-09 | Discharge: 2016-02-09 | Disposition: A | Discharge status: Home / Self Care | Payer: BLUE CROSS/BLUE SHIELD | Source: Ambulatory Visit | Attending: Thoracic Surgery (Cardiothoracic Vascular Surgery) | Admitting: Thoracic Surgery (Cardiothoracic Vascular Surgery)

## 2016-02-09 DIAGNOSIS — I4819 Other persistent atrial fibrillation: Secondary | ICD-10-CM

## 2016-02-09 DIAGNOSIS — Z01818 Encounter for other preprocedural examination: Secondary | ICD-10-CM

## 2016-02-09 DIAGNOSIS — I340 Nonrheumatic mitral (valve) insufficiency: Secondary | ICD-10-CM

## 2016-02-09 MED ORDER — IOPAMIDOL (ISOVUE-370) INJECTION 76%
80.0000 mL | Freq: Once | INTRAVENOUS | Status: AC | PRN
  Administered 2016-02-09: 80 mL via INTRAVENOUS

## 2016-03-05 ENCOUNTER — Encounter (HOSPITAL_COMMUNITY): Payer: Self-pay | Admitting: Nurse Practitioner

## 2016-03-05 ENCOUNTER — Ambulatory Visit (HOSPITAL_COMMUNITY)
Admission: RE | Admit: 2016-03-05 | Discharge: 2016-03-05 | Disposition: A | Discharge status: Home / Self Care | Payer: BLUE CROSS/BLUE SHIELD | Source: Ambulatory Visit | Attending: Nurse Practitioner | Admitting: Nurse Practitioner

## 2016-03-05 VITALS — BP 126/92 | HR 87 | Ht 74.0 in | Wt 307.8 lb

## 2016-03-05 DIAGNOSIS — I10 Essential (primary) hypertension: Secondary | ICD-10-CM

## 2016-03-05 DIAGNOSIS — Z6839 Body mass index (BMI) 39.0-39.9, adult: Secondary | ICD-10-CM | POA: Insufficient documentation

## 2016-03-05 DIAGNOSIS — F329 Major depressive disorder, single episode, unspecified: Secondary | ICD-10-CM | POA: Diagnosis not present

## 2016-03-05 DIAGNOSIS — Z8249 Family history of ischemic heart disease and other diseases of the circulatory system: Secondary | ICD-10-CM | POA: Insufficient documentation

## 2016-03-05 DIAGNOSIS — Z880 Allergy status to penicillin: Secondary | ICD-10-CM | POA: Diagnosis not present

## 2016-03-05 DIAGNOSIS — K219 Gastro-esophageal reflux disease without esophagitis: Secondary | ICD-10-CM | POA: Diagnosis not present

## 2016-03-05 DIAGNOSIS — I428 Other cardiomyopathies: Secondary | ICD-10-CM | POA: Insufficient documentation

## 2016-03-05 DIAGNOSIS — Z7901 Long term (current) use of anticoagulants: Secondary | ICD-10-CM | POA: Diagnosis not present

## 2016-03-05 DIAGNOSIS — E7800 Pure hypercholesterolemia, unspecified: Secondary | ICD-10-CM | POA: Diagnosis not present

## 2016-03-05 DIAGNOSIS — I110 Hypertensive heart disease with heart failure: Secondary | ICD-10-CM | POA: Diagnosis not present

## 2016-03-05 DIAGNOSIS — I481 Persistent atrial fibrillation: Secondary | ICD-10-CM | POA: Insufficient documentation

## 2016-03-05 DIAGNOSIS — R001 Bradycardia, unspecified: Secondary | ICD-10-CM

## 2016-03-05 DIAGNOSIS — Z79899 Other long term (current) drug therapy: Secondary | ICD-10-CM | POA: Diagnosis not present

## 2016-03-05 DIAGNOSIS — L409 Psoriasis, unspecified: Secondary | ICD-10-CM | POA: Insufficient documentation

## 2016-03-05 DIAGNOSIS — I4819 Other persistent atrial fibrillation: Secondary | ICD-10-CM

## 2016-03-05 DIAGNOSIS — I5032 Chronic diastolic (congestive) heart failure: Secondary | ICD-10-CM | POA: Insufficient documentation

## 2016-03-05 DIAGNOSIS — E669 Obesity, unspecified: Secondary | ICD-10-CM | POA: Diagnosis not present

## 2016-03-05 DIAGNOSIS — I4891 Unspecified atrial fibrillation: Secondary | ICD-10-CM | POA: Diagnosis present

## 2016-03-05 DIAGNOSIS — G4733 Obstructive sleep apnea (adult) (pediatric): Secondary | ICD-10-CM | POA: Diagnosis not present

## 2016-03-05 DIAGNOSIS — Z8673 Personal history of transient ischemic attack (TIA), and cerebral infarction without residual deficits: Secondary | ICD-10-CM | POA: Diagnosis not present

## 2016-03-05 MED ORDER — CARVEDILOL 3.125 MG PO TABS: 6.2500 mg | ORAL_TABLET | Freq: Two times a day (BID) | ORAL | 11 refills | Status: DC

## 2016-03-05 NOTE — Progress Notes (Signed)
Patient ID: Joseph Garrett, male   DOB: Apr 04, 1961, 55 y.o.   MRN: [id number]     Primary Care Physician: Roxanne Mins, PA-C Referring Physician: Dr. Donne Joseph Garrett is a 55 y.o. male with a h/o persistent afib s/p abaltion x 2 on amiodarone. He is here for ER f/u from 5/7, where he presented in afib and did not cardiovert x 3 attempts.  It was recommended that he increase amiodarone to 400 mg daily and f/u in afib clinic. He did increase amiodarone x 2 -3 weeks but recently reduced dose on his own and called to make appointment. He does feel worse in afib. He states his last cardioversion was in the cath lab and he required 300 joules. He has not missed any doses of xarelto. He was sent to pulmonology for abnormal PFT's last fall and was asked to return in 4 months for repeat testing and he cancelled appointment.   He was rescheduled in the cath lab with cardioversion with Dr. [name]. He did return to SR but had ERAF. He was then referred to [name] for a maze procedure which is pending 9/18. He asked to be seen today for just getting tired of afib/ fatigue, intermittent dizziness. He does not check his BP/HR at home and asked if he could be seen today.   In office, 9/5, he is in afib at 87 bpm and BP is acceptable at 126/92. He does fly to [place] on Thursday. Will increase coreg to 6.25 mg bid to see if he feels better.  Today, he denies symptoms of palpitations, chest pain, shortness of breath, orthopnea, PND, lower extremity edema, dizziness, presyncope, syncope, or neurologic sequela.Positive for fatigue. The patient is tolerating medications without difficulties and is otherwise without complaint today.   Past Medical History:  Diagnosis Date  . CHF (congestive heart failure) (HCC)   . Chronic combined systolic and diastolic CHF (congestive heart failure) (HCC)   . Cirrhosis (HCC)    liver scarring on biopsy, presumed to be due to methotrexate  . Depression   . DJD  (degenerative joint disease)   . Essential hypertension 08/30/2013  . GERD (gastroesophageal reflux disease)   . Gout   . Hives   . Hypercholesteremia   . Hypertension   . Mitral regurgitation - type I dysfunction - moderate by TEE   . Obesity   . OSA (obstructive sleep apnea)    mild, uses CPAP  . Paroxysmal atrial fibrillation (HCC)   . Persistent atrial fibrillation (HCC) 03/02/2012  . Psoriasis   . Syncope 02/21/2014  . Thrombocytopenia (HCC)   . TIA (transient ischemic attack)    Past Surgical History:  Procedure Laterality Date  . ABLATION OF DYSRHYTHMIC FOCUS  09/17/2012  . ATRIAL FIBRILLATION ABLATION  03/17/12   PVI and CTI ablation by Dr [name]  . ATRIAL FIBRILLATION ABLATION N/A 03/17/2012   Procedure: ATRIAL FIBRILLATION ABLATION;  Surgeon: Hillis Range, MD;  Location: [facility] CATH LAB;  Service: Cardiovascular;  Laterality: N/A;  . ATRIAL FIBRILLATION ABLATION N/A 09/18/2012   Procedure: ATRIAL FIBRILLATION ABLATION;  Surgeon: Hillis Range, MD;  Location: [facility] CATH LAB;  Service: Cardiovascular;  Laterality: N/A;  . CARDIAC CATHETERIZATION N/A 01/25/2016   Procedure: Right/Left Heart Cath and Coronary Angiography;  Surgeon: Laurey Morale, MD;  Location: [facility] INVASIVE CV LAB;  Service: Cardiovascular;  Laterality: N/A;  . CARDIOVERSION N/A 10/09/2012   Procedure: CARDIOVERSION;  Surgeon: Laurey Morale, MD;  Location: [facility] ENDOSCOPY;  Service: Cardiovascular;  Laterality: N/A;  . CARDIOVERSION N/A 02/22/2014   Procedure: CARDIOVERSION;  Surgeon: Quintella Reichert, MD;  Location: [facility] ENDOSCOPY;  Service: Cardiovascular;  Laterality: N/A;  . CARDIOVERSION N/A 12/29/2015   Procedure: CARDIOVERSION;  Surgeon: Hillis Range, MD;  Location: [facility] OR;  Service: Cardiovascular;  Laterality: N/A;  . CHOLECYSTECTOMY  2011  . ELECTROPHYSIOLOGIC STUDY N/A 12/29/2015   Procedure: Cardioversion;  Surgeon: Hillis Range, MD;  Location: [facility] INVASIVE CV LAB;  Service: Cardiovascular;  Laterality: N/A;  . NOSE  SURGERY     Turbinates  . RECONSTRUCTION OF NOSE    . TEE WITHOUT CARDIOVERSION  03/16/2012   Procedure: TRANSESOPHAGEAL ECHOCARDIOGRAM (TEE);  Surgeon: [name], MD;  Location: [facility] ENDOSCOPY;  Service: Cardiovascular;  Laterality: N/A;  . TEE WITHOUT CARDIOVERSION N/A 09/17/2012   Procedure: TRANSESOPHAGEAL ECHOCARDIOGRAM (TEE);  Surgeon: Lewayne Bunting, MD;  Location: [facility] ENDOSCOPY;  Service: Cardiovascular;  Laterality: N/A;  . TEE WITHOUT CARDIOVERSION N/A 01/25/2016   Procedure: TRANSESOPHAGEAL ECHOCARDIOGRAM (TEE);  Surgeon: Laurey Morale, MD;  Location: [facility] ENDOSCOPY;  Service: Cardiovascular;  Laterality: N/A;  . TENDON REPAIR     Right Forearm    Current Outpatient Prescriptions  Medication Sig Dispense Refill  . amiodarone (PACERONE) 200 MG tablet TAKE 1 TABLET DAILY 180 tablet 3  . carvedilol (COREG) 3.125 MG tablet Take 2 tablets (6.25 mg total) by mouth 2 (two) times daily with a meal. 60 tablet 11  . lisinopril (PRINIVIL,ZESTRIL) 20 MG tablet Take 1 tablet (20 mg total) by mouth 2 (two) times daily. 28 tablet 0  . Oxycodone HCl 10 MG TABS Take 10 mg by mouth daily.     Marland Kitchen oxymetazoline (AFRIN) 0.05 % nasal spray Place 2 sprays into both nostrils 2 (two) times daily as needed for congestion.    . rivaroxaban (XARELTO) 20 MG TABS tablet Take 20 mg by mouth every evening.     . sertraline (ZOLOFT) 100 MG tablet Take 150 mg by mouth every morning.     . simvastatin (ZOCOR) 20 MG tablet TAKE 1 TABLET DAILY 90 tablet 3   No current facility-administered medications for this encounter.     Allergies  Allergen Reactions  . Penicillins Rash    Has patient had a PCN reaction causing immediate rash, facial/tongue/throat swelling, SOB or lightheadedness with hypotension: Yes Has patient had a PCN reaction causing severe rash involving mucus membranes or skin necrosis: No Has patient had a PCN reaction that required hospitalization No Has patient had a PCN reaction occurring within  the last 10 years: No If all of the above answers are "NO", then may proceed with Cephalosporin use.     Social History   Social History  . Marital status: Married    Spouse name: N/A  . Number of children: N/A  . Years of education: N/A   Occupational History  . Not on file.   Social History Main Topics  . Smoking status: Never Smoker  . Smokeless tobacco: Never Used  . Alcohol use No  . Drug use: No  . Sexual activity: Not on file   Other Topics Concern  . Not on file   Social History Narrative   Lives in [place] with spouse.  3 children are healthy.     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    Family History  Problem Relation Age of Onset  . Heart attack Father   . Heart disease    . Diabetes  ROS- All systems are reviewed and negative except as per the HPI above  Physical Exam: Vitals:   03/05/16 1123  BP: (!) 126/92  Pulse: 87  Weight: (!) 307 lb 12.8 oz (139.6 kg)  Height: 6\' 2"  (1.88 m)    GEN- The patient is well appearing, alert and oriented x 3 today.   Head- normocephalic, atraumatic Eyes-  Sclera clear, conjunctiva pink Ears- hearing intact Oropharynx- clear Neck- supple, no JVP Lymph- no cervical lymphadenopathy Lungs- Clear to ausculation bilaterally, normal work of breathing Heart- irregular rate and rhythm, no murmurs, rubs or gallops, PMI not laterally displaced GI- soft, NT, ND, + BS Extremities- no clubbing, cyanosis, or edema MS- no significant deformity or atrophy Skin- no rash or lesion Psych- euthymic mood, full affect Neuro- strength and sensation are intact  EKG- afib/flutter at 87 bpm, qrs 88 ms, qtc 447 ms.  Epic records reviewed  Assessment and Plan: 1.Persisitent symptomatic afib with failed cardioversion x2, May and June of this yea, as well as failed abation x2.  He desires to be back in rhythm, pending MAZE procedure 9/18 Continue amiodarone at 200 mg a day for rate control Increase  coreg to 6.25 mg bid to see if improves symptoms and f/u tomorrow pm for BP/HR check  2. HTN Stable  3. Nonischemic CM Stable    Roise Emert C. Matthew Folksarroll, ANP-C Afib Clinic [facility] [address] [place], [place] [id number] [phone]

## 2016-03-05 NOTE — Patient Instructions (Signed)
Your physician has recommended you make the following change in your medication:  1)Increase coreg to 6.25mg  twice a day

## 2016-03-06 ENCOUNTER — Ambulatory Visit (HOSPITAL_COMMUNITY)
Admission: RE | Admit: 2016-03-06 | Discharge: 2016-03-06 | Disposition: A | Discharge status: Home / Self Care | Payer: BLUE CROSS/BLUE SHIELD | Source: Ambulatory Visit | Attending: Nurse Practitioner | Admitting: Nurse Practitioner

## 2016-03-06 NOTE — Progress Notes (Signed)
Pt in for BP and HR check today.  Pt heart rate was 85 and the blood pressure reading was 128/92.  To be reviewed by Rudi Coco, NP

## 2016-03-08 ENCOUNTER — Encounter (HOSPITAL_COMMUNITY): Payer: Self-pay | Admitting: *Deleted

## 2016-03-15 ENCOUNTER — Encounter (HOSPITAL_COMMUNITY): Payer: Self-pay

## 2016-03-15 NOTE — Pre-Procedure Instructions (Addendum)
Joseph Garrett  03/15/2016      [id number] - [place], [place] - [address] AT [facility] [address] [place] [address] Phone: [phone] Fax: [phone]  [facility] - [place], [place] - [address] [place] [address] Phone: [phone] Fax: [phone]  [facility] - [place], [place] - [address] [place] [address] Phone: [phone] Fax: [phone]    Your procedure is scheduled on Wednesday September 20.  Report to [facility] at 6:30 A.M.  Call this number if you have problems the morning of surgery:  [phone]   Remember:  Do not eat food or drink liquids after midnight.  Take these medicines the morning of surgery with A SIP OF WATER: amiodarone (pacerone), carvedilol (Coreg), oxycodone if needed, nasal spray if needed, sertraline (Zoloft)  7 days prior to surgery STOP taking any Aspirin, Aleve, Naproxen, Ibuprofen, Motrin, Advil, Goody's, BC's, all herbal medications, fish oil, and all vitamins    Do not wear jewelry.  Do not wear lotions, powders, or cologne or deoderant.  Do not shave 48 hours prior to surgery.  Men may shave face and neck.  Do not bring valuables to the hospital.  [facility] is not responsible for any belongings or valuables.  Contacts, dentures or bridgework may not be worn into surgery.  Leave your suitcase in the car.  After surgery it may be brought to your room.  For patients admitted to the hospital, discharge time will be determined by your treatment team.  Patients discharged the day of surgery will not be allowed to drive home.    Special instructions:     [facility]- Preparing For Surgery  Before surgery, you can play an important role. Because skin is not sterile, your skin needs to be as free of germs as possible. You can reduce the number of germs on  your skin by washing with CHG (chlorahexidine gluconate) Soap before surgery.  CHG is an antiseptic cleaner which kills germs and bonds with the skin to continue killing germs even after washing.  Please do not use if you have an allergy to CHG or antibacterial soaps. If your skin becomes reddened/irritated stop using the CHG.  Do not shave (including legs and underarms) for at least 48 hours prior to first CHG shower. It is OK to shave your face.  Please follow these instructions carefully.   1. Shower the NIGHT BEFORE SURGERY and the MORNING OF SURGERY with CHG.   2. If you chose to wash your hair, wash your hair first as usual with your normal shampoo.  3. After you shampoo, rinse your hair and body thoroughly to remove the shampoo.  4. Use CHG as you would any other liquid soap. You can apply CHG directly to the skin and wash gently with a scrungie or a clean washcloth.   5. Apply the CHG Soap to your body ONLY FROM THE NECK DOWN.  Do not use on open wounds or open sores. Avoid contact with your eyes, ears, mouth and genitals (private parts). Wash genitals (private parts) with your normal soap.  6. Wash thoroughly, paying special attention to the area where your surgery will be performed.  7. Thoroughly rinse your body with warm water from the neck down.  8. DO NOT shower/wash with your normal soap after using  and rinsing off the CHG Soap.  9. Pat yourself dry with a CLEAN TOWEL.   10. Wear CLEAN PAJAMAS   11. Place CLEAN SHEETS on your bed the night of your first shower and DO NOT SLEEP WITH PETS.    Day of Surgery: Do not apply any deodorants/lotions. Please wear clean clothes to the hospital/surgery center.      Please read over the following fact sheets that you were given. MRSA Information

## 2016-03-18 ENCOUNTER — Ambulatory Visit (INDEPENDENT_AMBULATORY_CARE_PROVIDER_SITE_OTHER): Payer: BLUE CROSS/BLUE SHIELD | Admitting: Thoracic Surgery (Cardiothoracic Vascular Surgery)

## 2016-03-18 ENCOUNTER — Ambulatory Visit (HOSPITAL_COMMUNITY)
Admission: RE | Admit: 2016-03-18 | Discharge: 2016-03-18 | Disposition: A | Discharge status: Home / Self Care | Payer: BLUE CROSS/BLUE SHIELD | Source: Ambulatory Visit | Attending: Thoracic Surgery (Cardiothoracic Vascular Surgery) | Admitting: Thoracic Surgery (Cardiothoracic Vascular Surgery)

## 2016-03-18 ENCOUNTER — Encounter: Payer: Self-pay | Admitting: Thoracic Surgery (Cardiothoracic Vascular Surgery)

## 2016-03-18 ENCOUNTER — Other Ambulatory Visit: Payer: Self-pay

## 2016-03-18 ENCOUNTER — Encounter (HOSPITAL_COMMUNITY): Payer: Self-pay

## 2016-03-18 ENCOUNTER — Encounter (HOSPITAL_COMMUNITY)
Admission: RE | Admit: 2016-03-18 | Discharge: 2016-03-18 | Disposition: A | Discharge status: Home / Self Care | Payer: BLUE CROSS/BLUE SHIELD | Source: Ambulatory Visit | Attending: Thoracic Surgery (Cardiothoracic Vascular Surgery) | Admitting: Thoracic Surgery (Cardiothoracic Vascular Surgery)

## 2016-03-18 ENCOUNTER — Other Ambulatory Visit (HOSPITAL_COMMUNITY): Payer: BLUE CROSS/BLUE SHIELD

## 2016-03-18 VITALS — BP 127/84 | HR 56 | Resp 20 | Ht 74.0 in | Wt 306.0 lb

## 2016-03-18 DIAGNOSIS — I480 Paroxysmal atrial fibrillation: Secondary | ICD-10-CM | POA: Diagnosis not present

## 2016-03-18 DIAGNOSIS — I340 Nonrheumatic mitral (valve) insufficiency: Secondary | ICD-10-CM | POA: Diagnosis not present

## 2016-03-18 DIAGNOSIS — I4891 Unspecified atrial fibrillation: Secondary | ICD-10-CM | POA: Insufficient documentation

## 2016-03-18 DIAGNOSIS — R918 Other nonspecific abnormal finding of lung field: Secondary | ICD-10-CM | POA: Diagnosis not present

## 2016-03-18 DIAGNOSIS — G4700 Insomnia, unspecified: Secondary | ICD-10-CM

## 2016-03-18 DIAGNOSIS — I517 Cardiomegaly: Secondary | ICD-10-CM | POA: Insufficient documentation

## 2016-03-18 DIAGNOSIS — J984 Other disorders of lung: Secondary | ICD-10-CM | POA: Diagnosis not present

## 2016-03-18 DIAGNOSIS — I4819 Other persistent atrial fibrillation: Secondary | ICD-10-CM

## 2016-03-18 DIAGNOSIS — R942 Abnormal results of pulmonary function studies: Secondary | ICD-10-CM | POA: Insufficient documentation

## 2016-03-18 DIAGNOSIS — I5042 Chronic combined systolic (congestive) and diastolic (congestive) heart failure: Secondary | ICD-10-CM | POA: Diagnosis not present

## 2016-03-18 DIAGNOSIS — I481 Persistent atrial fibrillation: Secondary | ICD-10-CM

## 2016-03-18 HISTORY — DX: Reserved for inherently not codable concepts without codable children: IMO0001

## 2016-03-18 LAB — ABO/RH: ABO/RH(D): A POS

## 2016-03-18 LAB — BLOOD GAS, ARTERIAL
Acid-base deficit: 0.3 mmol/L (ref 0.0–2.0)
BICARBONATE: 23.6 mmol/L (ref 20.0–28.0)
Drawn by: 206361
FIO2: 21
O2 Saturation: 96.5 %
PCO2 ART: 37.5 mmHg (ref 32.0–48.0)
PH ART: 7.416 (ref 7.350–7.450)
PO2 ART: 87.5 mmHg (ref 83.0–108.0)
Patient temperature: 98.6

## 2016-03-18 LAB — VAS US DOPPLER PRE CABG
LCCADSYS: -90 cm/s
LCCAPDIAS: 30 cm/s
LEFT ECA DIAS: -17 cm/s
LEFT VERTEBRAL DIAS: 18 cm/s
LICADDIAS: -33 cm/s
LICAPDIAS: -15 cm/s
LICAPSYS: -52 cm/s
Left CCA dist dias: -23 cm/s
Left CCA prox sys: 108 cm/s
Left ICA dist sys: -81 cm/s
RIGHT ECA DIAS: -19 cm/s
RIGHT VERTEBRAL DIAS: 17 cm/s
Right CCA prox dias: 18 cm/s
Right CCA prox sys: 67 cm/s
Right cca dist sys: -55 cm/s

## 2016-03-18 LAB — PULMONARY FUNCTION TEST
DL/VA % PRED: 80 %
DL/VA: 3.9 ml/min/mmHg/L
DLCO COR % PRED: 64 %
DLCO UNC: 25.06 ml/min/mmHg
DLCO cor: 24.46 ml/min/mmHg
DLCO unc % pred: 66 %
FEF 25-75 POST: 3.89 L/s
FEF 25-75 Pre: 3.55 L/sec
FEF2575-%CHANGE-POST: 9 %
FEF2575-%PRED-POST: 110 %
FEF2575-%Pred-Pre: 101 %
FEV1-%Change-Post: 3 %
FEV1-%PRED-POST: 84 %
FEV1-%Pred-Pre: 82 %
FEV1-PRE: 3.48 L
FEV1-Post: 3.59 L
FEV1FVC-%CHANGE-POST: 3 %
FEV1FVC-%Pred-Pre: 105 %
FEV6-%Change-Post: 0 %
FEV6-%PRED-PRE: 81 %
FEV6-%Pred-Post: 80 %
FEV6-POST: 4.31 L
FEV6-PRE: 4.32 L
FEV6FVC-%PRED-POST: 104 %
FEV6FVC-%PRED-PRE: 104 %
FVC-%CHANGE-POST: 0 %
FVC-%PRED-PRE: 77 %
FVC-%Pred-Post: 77 %
FVC-POST: 4.31 L
FVC-PRE: 4.32 L
POST FEV6/FVC RATIO: 100 %
PRE FEV1/FVC RATIO: 81 %
PRE FEV6/FVC RATIO: 100 %
Post FEV1/FVC ratio: 83 %
RV % PRED: 105 %
RV: 2.52 L
TLC % pred: 90 %
TLC: 7.05 L

## 2016-03-18 LAB — CBC
HEMATOCRIT: 46 % (ref 39.0–52.0)
HEMOGLOBIN: 15.2 g/dL (ref 13.0–17.0)
MCH: 30.6 pg (ref 26.0–34.0)
MCHC: 33 g/dL (ref 30.0–36.0)
MCV: 92.7 fL (ref 78.0–100.0)
Platelets: 169 10*3/uL (ref 150–400)
RBC: 4.96 MIL/uL (ref 4.22–5.81)
RDW: 13.2 % (ref 11.5–15.5)
WBC: 6.1 10*3/uL (ref 4.0–10.5)

## 2016-03-18 LAB — COMPREHENSIVE METABOLIC PANEL
ALK PHOS: 72 U/L (ref 38–126)
ALT: 40 U/L (ref 17–63)
ANION GAP: 9 (ref 5–15)
AST: 28 U/L (ref 15–41)
Albumin: 4 g/dL (ref 3.5–5.0)
BILIRUBIN TOTAL: 1.2 mg/dL (ref 0.3–1.2)
BUN: 24 mg/dL — ABNORMAL HIGH (ref 6–20)
CALCIUM: 9.4 mg/dL (ref 8.9–10.3)
CO2: 20 mmol/L — ABNORMAL LOW (ref 22–32)
CREATININE: 1.28 mg/dL — AB (ref 0.61–1.24)
Chloride: 111 mmol/L (ref 101–111)
GFR calc non Af Amer: >60 mL/min (ref >60)
Glucose, Bld: 62 mg/dL — ABNORMAL LOW (ref 65–99)
Potassium: 4.5 mmol/L (ref 3.5–5.1)
Sodium: 140 mmol/L (ref 135–145)
TOTAL PROTEIN: 6.9 g/dL (ref 6.5–8.1)

## 2016-03-18 LAB — TYPE AND SCREEN
ABO/RH(D): A POS
Antibody Screen: NEGATIVE

## 2016-03-18 LAB — URINALYSIS, ROUTINE W REFLEX MICROSCOPIC
Bilirubin Urine: NEGATIVE
GLUCOSE, UA: NEGATIVE mg/dL
HGB URINE DIPSTICK: NEGATIVE
Ketones, ur: NEGATIVE mg/dL
LEUKOCYTES UA: NEGATIVE
Nitrite: NEGATIVE
Protein, ur: NEGATIVE mg/dL
SPECIFIC GRAVITY, URINE: 1.014 (ref 1.005–1.030)
pH: 5.5 (ref 5.0–8.0)

## 2016-03-18 LAB — SURGICAL PCR SCREEN
MRSA, PCR: POSITIVE — AB
Staphylococcus aureus: POSITIVE — AB

## 2016-03-18 LAB — PROTIME-INR
INR: 1.06
Prothrombin Time: 13.8 seconds (ref 11.4–15.2)

## 2016-03-18 LAB — APTT: APTT: 30 s (ref 24–36)

## 2016-03-18 MED ORDER — ZOLPIDEM TARTRATE 5 MG PO TABS: 5.0000 mg | ORAL_TABLET | Freq: Every evening | ORAL | Status: DC | PRN

## 2016-03-18 MED ORDER — ZOLPIDEM TARTRATE 5 MG PO TABS: 5.0000 mg | ORAL_TABLET | Freq: Every evening | ORAL | 0 refills | Status: DC | PRN

## 2016-03-18 MED ORDER — ALBUTEROL SULFATE (2.5 MG/3ML) 0.083% IN NEBU
2.5000 mg | INHALATION_SOLUTION | Freq: Once | RESPIRATORY_TRACT | Status: AC
  Administered 2016-03-18: 2.5 mg via RESPIRATORY_TRACT

## 2016-03-18 NOTE — Telephone Encounter (Signed)
RX for Ambien 5 mg called to [facility]

## 2016-03-18 NOTE — Progress Notes (Signed)
[address]       [place] [address]             [phone]     CARDIOTHORACIC SURGERY OFFICE NOTE  Referring Provider is Hillis Range, MD PCP is Roxanne Mins, PA-C   HPI:  Patient returns to the office today for follow-up of chronic persistent atrial fibrillation, chronic combined systolic and diastolic congestive heart failure and mitral regurgitation with plans to proceed with minimally invasive maze procedure and possible mitral valve repair later this week.  He was originally seen in consultation on 02/01/2016.  He reports no new problems or complaints. He states that he was having more dizzy spells and exertional fatigue recently, and his dose of carvedilol was adjusted by Rudi Coco in the atrial fibrillation clinic. He denies any resting shortness of breath, PND, orthopnea, chest pain, or syncope. Appetite is stable. He denies any fevers, chills, or productive cough. He has been having problems sleeping recently.   Current Outpatient Prescriptions  Medication Sig Dispense Refill  . amiodarone (PACERONE) 200 MG tablet TAKE 1 TABLET DAILY 180 tablet 3  . carvedilol (COREG) 3.125 MG tablet Take 3.125 mg by mouth 2 (two) times daily with a meal.    . lisinopril (PRINIVIL,ZESTRIL) 20 MG tablet Take 1 tablet (20 mg total) by mouth 2 (two) times daily. 28 tablet 0  . Oxycodone HCl 10 MG TABS Take 10 mg by mouth daily as needed (pain).     Marland Kitchen oxymetazoline (AFRIN) 0.05 % nasal spray Place 2 sprays into both nostrils 2 (two) times daily as needed for congestion.    . sertraline (ZOLOFT) 100 MG tablet Take 150 mg by mouth every morning.     . simvastatin (ZOCOR) 20 MG tablet TAKE 1 TABLET DAILY 90 tablet 3   Current Facility-Administered Medications  Medication Dose Route Frequency Provider Last Rate Last Dose  . zolpidem (AMBIEN) tablet 5 mg  5 mg Oral QHS PRN Purcell Nails, MD          Physical Exam:   BP 127/84 (BP Location: Right Arm, Patient  Position: Sitting, Cuff Size: Large)   Pulse (!) 56   Resp 20   Ht 6\' 2"  (1.88 m)   Wt (!) 306 lb (138.8 kg)   SpO2 97% Comment: RA  BMI 39.29 kg/m   General:  Obese but well appearing  Chest:   Clear to auscultation  CV:   Irregular rate and rhythm  Incisions:  n/a  Abdomen:  Soft and nontender  Extremities:  Warm and well-perfused  Diagnostic Tests:  CT ANGIOGRAPHY CHEST, ABDOMEN AND PELVIS  TECHNIQUE: Multidetector CT imaging through the chest, abdomen and pelvis was performed using the standard protocol during bolus administration of intravenous contrast. Multiplanar reconstructed images and MIPs were obtained and reviewed to evaluate the vascular anatomy.  CONTRAST:  80 mL of Isovue 370 intravenously.  COMPARISON:  None.  FINDINGS: CTA CHEST FINDINGS  No pneumothorax or pleural effusion is noted. Minimal subsegmental atelectasis is noted in both lung bases. There is no evidence of thoracic aortic dissection or aneurysm. Mild coronary artery calcifications are noted. No mass or adenopathy is noted in the mediastinum. Great vessels are widely patent without significant stenosis. No significant osseous abnormality is noted.  Review of the MIP images confirms the above findings.  CTA ABDOMEN AND PELVIS FINDINGS  There is no evidence of abdominal aortic aneurysm or dissection. The mesenteric and renal arteries are widely patent without  significant stenosis. Visualized iliac arteries are widely patent. Status post cholecystectomy. The liver, spleen and pancreas unremarkable. Adrenal glands and kidneys appear normal. No hydronephrosis or renal obstruction is noted. No renal or ureteral calculi are noted. There is no evidence of bowel obstruction. The appendix appears normal. Sigmoid diverticulosis is noted without inflammation. Urinary bladder appears normal. No significant adenopathy is noted.  Review of the MIP images confirms the above  findings.  IMPRESSION: There is no evidence of thoracic or abdominal aortic aneurysm or dissection.  Mild coronary artery calcifications are noted suggesting coronary artery disease.  Sigmoid diverticulosis is noted without inflammation.   Electronically Signed   By: Lupita RaiderJames  Green Jr, M.D.   On: 02/09/2016 12:24    Impression:  Patient has long-standing history of atrial fibrillation that dates back more than 20 years ago. Recently he has had worsening symptoms of exertional shortness of breath, fatigue, and palpitations in the setting of recurrent persistent atrial fibrillation that has failed medical therapy with multiple agents and catheter-based ablation on 2 previous occasions.  Comorbid medical conditions include history of chronic combined systolic and diastolic congestive heart failure, obesity, obstructive sleep apnea, and hypertension.  I have personally reviewed the patient's recent transesophageal echocardiogram and diagnostic catheterization. TEE reveals type I dysfunction of the mitral valve with moderate central mitral regurgitation.  There is mild left atrial enlargement and mild left ventricular hypertrophy. Diagnostic cardiac catheterization is notable for the absence of significant coronary artery disease. Options include long-term medical therapy versus surgical ablation. I feel the patient is an acceptable candidate for Maze procedure.  CT angiography reveals no contraindications to peripheral arterial cannulation for surgery   Plan:  The patient was again counseled at length regarding the indications, risks and potential benefits of maze procedure.  The rationale for elective surgery has been explained, including a comparison between surgery and continued medical therapy.  The relative risks and benefits of performing a maze procedure was discussed at length, including the expected likelihood of long term freedom from recurrent symptomatic atrial fibrillation  and/or atrial flutter as well as the risks of possible need for permanent pacemaker placement.  Alternative surgical approaches have been discussed including a comparison between conventional sternotomy and minimally-invasive techniques.  The relative risks and benefits of each have been reviewed as they pertain to the patient's specific circumstances, and all of their questions have been addressed.  We also discussed the fact that the patient has at least mild to moderate mitral regurgitation secondary to type I mitral valve dysfunction. Under the circumstances we would plan to reevaluate the severity of mitral regurgitation at the time of surgery and proceed with mitral valve repair (ring annuloplasty) if necessary.      The patient understands and accepts all potential risks of surgery including but not limited to risk of death, stroke or other neurologic complication, myocardial infarction, congestive heart failure, respiratory failure, renal failure, bleeding requiring transfusion and/or reexploration, arrhythmia, infection or other wound complications, pneumonia, pleural and/or pericardial effusion, pulmonary embolus, aortic dissection or other major vascular complication, or delayed complications related to valve repair or replacement including but not limited to structural valve deterioration and failure, thrombosis, embolization, endocarditis, or paravalvular leak.  Specific risks potentially related to the minimally-invasive approach were discussed at length, including but not limited to risk of conversion to full or partial sternotomy, aortic dissection or other major vascular complication, unilateral acute lung injury or pulmonary edema, phrenic nerve dysfunction or paralysis, rib fracture, chronic pain, lung hernia,  or lymphocele.  Expectations for the patient's postoperative convalescence at been discussed. All of his questions have been answered.    I spent in excess of 30 minutes during the  conduct of this office consultation and >50% of this time involved direct face-to-face encounter with the patient for counseling and/or coordination of their care.    Salvatore Decent. Cornelius Moras, MD 03/18/2016 10:01 AM

## 2016-03-18 NOTE — Progress Notes (Signed)
Pre-op Cardiac Surgery  Carotid Findings:  Bilateral: No significant (1-39%) ICA stenosis. Antegrade vertebral flow.    Upper Extremity Right Left  Brachial Pressures 143 132  Radial Waveforms Tri Tri  Ulnar Waveforms Tri Tri  Palmar Arch (Allen's Test) Normal  Normal     Farrel Demark, RDMS, RVT  03/18/2016

## 2016-03-18 NOTE — Patient Instructions (Signed)
Patient has already stopped taking Xarelto  Patient should continue taking all current medications without change through the day before surgery.  Patient should have nothing to eat or drink after midnight the night before surgery.  On the morning of surgery patient should take only carvedilol with a sip of water.

## 2016-03-18 NOTE — Progress Notes (Signed)
Mupirocin Ointment Rx called into [facility] on [address], [place] for positive PCR for MRSA and Staph. Left message on pt's voicemail informing him of results and need to pick up Rx.

## 2016-03-18 NOTE — Progress Notes (Signed)
PCP:Dr. Alycia Rossetti @ [facility]  Cardiologist:Dr. Allred ([place]) And Dr. Kathlene November duran @ [facility]  Requested sleep study from [facility] in [place]

## 2016-03-18 NOTE — H&P (Signed)
[address]       [place] [id number]             [phone]          CARDIOTHORACIC SURGERY HISTORY AND PHYSICAL EXAM  Referring Provider is Hillis RangeAllred, James, MD PCP is Roxanne Minsuran, Michael, PA-C      Chief Complaint  Patient presents with  . Atrial Fibrillation    Surgical eval for MAZE procedure, Cardiac Cath 01/15/16    HPI:  Patient is a 55 year old obese white male with long-standing history of atrial fibrillation, chronic combined systolic and diastolic congestive heart failure, hypertension, hypercholesterolemia, and obstructive sleep apnea who has been referred for surgical consultation to discuss treatment options for management of recurrent persistent atrial fibrillation which has failed medical therapy on multiple agents and catheter-based ablation on 2 previous occasions.  The patient states that he was initially diagnosed with paroxysmal atrial fibrillation more than 20 years ago.  Over the years he developed increasing frequency and duration of symptoms of tachycardia palpitations and shortness of breath that occurred while the patient was in atrial fibrillation. He was followed for a period of time by Dr. Shelva Majesticorelli and treated with digoxin and Coumadin. He was treated with flecainide for at least 3 years. Ultimately he was referred to Dr. Johney FrameAllred and underwent catheter-based ablation in September 2013. He initially did well but he developed recurrent persistent atrial fibrillation within 6 months. He underwent repeat catheter-based ablation and March 2014.  Unfortunately he again developed early recurrence of atrial fibrillation. He was started on amiodarone and has undergone DC cardioversion on numerous occasions.  He continued to have episodes of recurrent persistent atrial fibrillation associated with symptoms of shortness of breath, dizzy spells, and 2 brief syncopal episodes. He was referred for surgical consultation in November 2015. We discussed the risks  and benefits of surgical Maze procedure at that time, but the patient decided to hold off as he was maintaining sinus rhythm on amiodarone at that time.  The patient states that several months ago he began to experience worsening symptoms of exertional shortness of breath and fatigue. He was seen in follow-up recently by Dr. Johney FrameAllred and the possibility of reconsidering surgical ablation was discussed.  He underwent repeat cardioversion on 12/29/2015. The patient initially went back into sinus rhythm but again returned to atrial fibrillation. Since then the patient subsequently underwent TEE and diagnostic cardiac catheterization by Dr. Shirlee LatchMcLean on 01/25/2016. Transesophageal echocardiogram revealed stable mild left ventricular systolic dysfunction with ejection fraction estimated 45-50%. There was mild to moderate central mitral regurgitation with mild left atrial enlargement and no thrombus in the left atrial appendage.  Diagnostic catheterization revealed mild nonobstructive coronary artery disease with mild left ventricular systolic dysfunction and normal right-sided pressures. The patient was referred for surgical consultation.  The patient is married and lives locally in [place] with his wife. They have 3 grown children. 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  In the past he enjoyed karate but he has not been practicing for quite some time now.  The patient complains of a several month history of exertional shortness of breath. He now gets short of breath with mild to moderate level activity such as going up a single flight of stairs. His exertional shortness of breath limits his daily activities to a significant degree. He denies resting shortness of breath, PND, orthopnea. He has never had any chest pain or chest tightness. He  has frequent palpitations. He has had occasional dizzy spells and 2 previous syncopal episodes, although last syncopal  episode was more than 57 years ago.  Patient returns to the office today for follow-up of chronic persistent atrial fibrillation, chronic combined systolic and diastolic congestive heart failure and mitral regurgitation with plans to proceed with minimally invasive maze procedure and possible mitral valve repair later this week.  He was originally seen in consultation on 02/01/2016.  He reports no new problems or complaints. He states that he was having more dizzy spells and exertional fatigue recently, and his dose of carvedilol was adjusted by Rudi Coco in the atrial fibrillation clinic. He denies any resting shortness of breath, PND, orthopnea, chest pain, or syncope. Appetite is stable. He denies any fevers, chills, or productive cough. He has been having problems sleeping recently.   Past Medical History:  Diagnosis Date  . CHF (congestive heart failure) (HCC)   . Chronic combined systolic and diastolic CHF (congestive heart failure) (HCC)   . Cirrhosis (HCC)    liver scarring on biopsy, presumed to be due to methotrexate  . Depression   . DJD (degenerative joint disease)   . Essential hypertension 08/30/2013  . GERD (gastroesophageal reflux disease)    not present  . Gout   . Hives   . Hypercholesteremia   . Hypertension   . Mitral regurgitation - type I dysfunction - moderate by TEE   . Obesity   . OSA (obstructive sleep apnea)    mild, uses CPAP  . Paroxysmal atrial fibrillation (HCC)   . Persistent atrial fibrillation (HCC) 03/02/2012  . Psoriasis   . Shortness of breath dyspnea    exertion  . Syncope 02/21/2014  . Thrombocytopenia (HCC)   . TIA (transient ischemic attack)     Past Surgical History:  Procedure Laterality Date  . ABLATION OF DYSRHYTHMIC FOCUS  09/17/2012  . ATRIAL FIBRILLATION ABLATION  03/17/12   PVI and CTI ablation by Dr [name]  . ATRIAL FIBRILLATION ABLATION N/A 03/17/2012   Procedure: ATRIAL FIBRILLATION ABLATION;  Surgeon: Hillis Range, MD;   Location: [facility] CATH LAB;  Service: Cardiovascular;  Laterality: N/A;  . ATRIAL FIBRILLATION ABLATION N/A 09/18/2012   Procedure: ATRIAL FIBRILLATION ABLATION;  Surgeon: Hillis Range, MD;  Location: [facility] CATH LAB;  Service: Cardiovascular;  Laterality: N/A;  . CARDIAC CATHETERIZATION N/A 01/25/2016   Procedure: Right/Left Heart Cath and Coronary Angiography;  Surgeon: Laurey Morale, MD;  Location: [facility] INVASIVE CV LAB;  Service: Cardiovascular;  Laterality: N/A;  . CARDIOVERSION N/A 10/09/2012   Procedure: CARDIOVERSION;  Surgeon: Laurey Morale, MD;  Location: [facility] ENDOSCOPY;  Service: Cardiovascular;  Laterality: N/A;  . CARDIOVERSION N/A 02/22/2014   Procedure: CARDIOVERSION;  Surgeon: Quintella Reichert, MD;  Location: [facility] ENDOSCOPY;  Service: Cardiovascular;  Laterality: N/A;  . CARDIOVERSION N/A 12/29/2015   Procedure: CARDIOVERSION;  Surgeon: Hillis Range, MD;  Location: [facility] OR;  Service: Cardiovascular;  Laterality: N/A;  . CHOLECYSTECTOMY  2011  . ELBOW ARTHROSCOPY Left 08/2014  . ELECTROPHYSIOLOGIC STUDY N/A 12/29/2015   Procedure: Cardioversion;  Surgeon: Hillis Range, MD;  Location: [facility] INVASIVE CV LAB;  Service: Cardiovascular;  Laterality: N/A;  . EYE SURGERY Bilateral 04/2015   cataract surgery  . KNEE ARTHROSCOPY Right   . NOSE SURGERY     Turbinates  . RECONSTRUCTION OF NOSE    . TEE WITHOUT CARDIOVERSION  03/16/2012   Procedure: TRANSESOPHAGEAL ECHOCARDIOGRAM (TEE);  Surgeon: [name], MD;  Location: [facility] ENDOSCOPY;  Service:  Cardiovascular;  Laterality: N/A;  . TEE WITHOUT CARDIOVERSION N/A 09/17/2012   Procedure: TRANSESOPHAGEAL ECHOCARDIOGRAM (TEE);  Surgeon: Lewayne Bunting, MD;  Location: [facility] ENDOSCOPY;  Service: Cardiovascular;  Laterality: N/A;  . TEE WITHOUT CARDIOVERSION N/A 01/25/2016   Procedure: TRANSESOPHAGEAL ECHOCARDIOGRAM (TEE);  Surgeon: Laurey Morale, MD;  Location: [facility] ENDOSCOPY;  Service: Cardiovascular;  Laterality: N/A;  . TENDON REPAIR     Right Forearm  . TENDON  REPAIR Right 1990's   forearm    Family History  Problem Relation Age of Onset  . Heart attack Father   . Heart disease    . Diabetes      Social History Social History  Substance Use Topics  . Smoking status: Never Smoker  . Smokeless tobacco: Never Used  . Alcohol use No    Prior to Admission medications   Medication Sig Start Date End Date Taking? Authorizing Provider  amiodarone (PACERONE) 200 MG tablet TAKE 1 TABLET DAILY 02/01/16  Yes Hillis Range, MD  lisinopril (PRINIVIL,ZESTRIL) 20 MG tablet Take 1 tablet (20 mg total) by mouth 2 (two) times daily. 01/05/16  Yes Newman Nip, NP  Oxycodone HCl 10 MG TABS Take 10 mg by mouth daily as needed (pain).  11/03/15  Yes Historical Provider, MD  oxymetazoline (AFRIN) 0.05 % nasal spray Place 2 sprays into both nostrils 2 (two) times daily as needed for congestion.   Yes Historical Provider, MD  sertraline (ZOLOFT) 100 MG tablet Take 150 mg by mouth every morning.    Yes Historical Provider, MD  simvastatin (ZOCOR) 20 MG tablet TAKE 1 TABLET DAILY 01/29/16  Yes Hillis Range, MD  carvedilol (COREG) 3.125 MG tablet Take 3.125 mg by mouth 2 (two) times daily with a meal.    Historical Provider, MD  zolpidem (AMBIEN) 5 MG tablet Take 1 tablet (5 mg total) by mouth at bedtime as needed for sleep. 03/18/16 06/16/16  Purcell Nails, MD    Allergies  Allergen Reactions  . Penicillins Rash    Has patient had a PCN reaction causing immediate rash, facial/tongue/throat swelling, SOB or lightheadedness with hypotension: Yes Has patient had a PCN reaction causing severe rash involving mucus membranes or skin necrosis: No Has patient had a PCN reaction that required hospitalization No Has patient had a PCN reaction occurring within the last 10 years: No If all of the above answers are "NO", then may proceed with Cephalosporin use.     Review of Systems:              General:                      normal appetite, decreased energy, no weight  gain, + intentional 10 lb weight loss, no fever             Cardiac:                       no chest pain with exertion, no chest pain at rest, +SOB with exertion, no resting SOB, no PND, no orthopnea, + palpitations, + arrhythmia, + atrial fibrillation, + LE edema, + dizzy spells, + syncope             Respiratory:                 + shortness of breath, no home oxygen, + productive cough, no dry cough, no bronchitis, no wheezing, no hemoptysis, no asthma, no pain with inspiration or  cough, + sleep apnea, + CPAP at night             GI:                               no difficulty swallowing, no reflux, no frequent heartburn, no hiatal hernia, no abdominal pain, no constipation, no diarrhea, no hematochezia, no hematemesis, no melena             GU:                              no dysuria,  + frequency, no urinary tract infection, no hematuria, + enlarged prostate, no kidney stones, no kidney disease             Vascular:                     no pain suggestive of claudication, no pain in feet, no leg cramps, no varicose veins, no DVT, no non-healing foot ulcer             Neuro:                         no stroke, ? recent TIA's, no seizures, no headaches, no temporary blindness one eye,  no slurred speech, no peripheral neuropathy, + chronic pain especially right great toe, no instability of gait, no memory/cognitive dysfunction             Musculoskeletal:         + arthritis, + joint swelling, no myalgias, no difficulty walking, normal mobility              Skin:                            + rash of psoriasis, no itching, no skin infections, no pressure sores or ulcerations             Psych:                         no anxiety, + depression, no nervousness, no unusual recent stress             Eyes:                           no blurry vision, no floaters, no recent vision changes, does not wear glasses or contacts             ENT:                            no hearing loss, no loose or painful teeth, no  dentures, last saw dentist several years ago             Hematologic:               + easy bruising, + abnormal bleeding, no clotting disorder, no frequent epistaxis             Endocrine:                   no diabetes, does not check CBG's at home  Physical Exam:              BP 125/86 (BP Location: Right Arm, Patient Position: Sitting, Cuff Size: Large)   Pulse 96   Resp 20   Ht 6\' 2"  (1.88 m)   Wt 295 lb (133.8 kg)   SpO2 98% Comment: RA  BMI 37.88 kg/m              General:                      Moderately obese,  well-appearing             HEENT:                       Unremarkable              Neck:                           no JVD, no bruits, no adenopathy              Chest:                          clear to auscultation, symmetrical breath sounds, no wheezes, no rhonchi              CV:                              Irregular rate and rhythm, no murmur              Abdomen:                    soft, non-tender, no masses              Extremities:                 warm, well-perfused, pulses diminished, no LE edema             Rectal/GU                   Deferred             Neuro:                         Grossly non-focal and symmetrical throughout             Skin:                            Clean and dry, no rashes, no breakdown   Diagnostic Tests:  Procedure: TEE  Indication: Atrial fibrillation, pre-Maze.  Sedation: Versed 4 mg IV, Fentanyl 50 mcg IV  Findings: Please see echo section for full report. The patient was in atrial fibrillation. Normal LV size with EF mildly reduced globally, estimated EF 45-50%. Normal RV size and systolic function. Trivial TR. Mild to moderate mitral regurgitation. Mild left atrial enlargement, no LA appendage thrombus. Normal right atrium. Trileaflet aortic valve with no stenosis or regurgitation. No PFO/ASD, negative bubble study. Normal caliber thoracic aorta with minimal plaque.   May proceed  to cath now.   Marca Ancona 01/25/2016 10:30 AM    Right/Left Heart Cath and Coronary Angiography  Conclusion  1. Normal filling pressures.  2. Nonobstructive coronary  disease.  3. EF 50-55%.   Procedural Details/Technique   Technical Details Procedure: Right Heart Cath, Left Heart Cath, Selective Coronary Angiography, LV angiography  Indication: Atrial fibrillation, pre-Maze surgery.   Procedural Details: The right brachial and radial areas were prepped, draped, and anesthetized with 1% lidocaine. There was a pre-existing peripheral IV in the right brachial area that was replaced with a 50F sheath. A Swan-Ganz catheter was used for the right heart catheterization. Standard protocol was followed for recording of right heart pressures and sampling of oxygen saturations. Fick cardiac output was calculated. The right radial artery was entered using modified Seldinger technique and a 35F sheath was placed. The patient received 3 mg IA verapamil and weight-based IV heparin. Standard Judkins catheters were used for selective coronary angiography and left ventriculography. There were no immediate procedural complications. The patient was transferred to the post catheterization recovery area for further monitoring.  During this procedure the patient is administered a total of Versed 2 mg and Fentanyl 50 mg to achieve and maintain moderate conscious sedation. The patient's heart rate, blood pressure, and oxygen saturation are monitored continuously during the procedure. The period of conscious sedation is 28 minutes, of which I was present face-to-face 100% of this time.   Estimated blood loss <50 mL. .    Coronary Findings   Dominance: Right  Left Main  30% distal LM stenosis.  Left Anterior Descending  Luminal irregularities.  Ramus Intermedius  Large, no significant disease.  Left Circumflex  No significant disease.  Right Coronary Artery  No significant disease.  Right Heart     Right Heart Pressures RHC Procedural Findings: Hemodynamics (mmHg) RA mean 6 RV 31/6 PA 33/9, mean 22 PCWP mean 10 LV 105/11 AO 110/69  Oxygen saturations: PA 65% AO 98%  Cardiac Output (Fick) 5.75  Cardiac Index (Fick) 2.21    Wall Motion   EF 50-55%, mild diffuse hypokinesis.         Implants        No implant documentation for this case.  PACS Images   Show images for Cardiac catheterization   Link to Procedure Log   Procedure Log    Hemo Data   Flowsheet Row Most Recent Value  Fick Cardiac Output 5.75 L/min  Fick Cardiac Output Index 2.21 (L/min)/BSA  RA A Wave 8 mmHg  RA V Wave 8 mmHg  RA Mean 6 mmHg  RV Systolic Pressure 31 mmHg  RV Diastolic Pressure 2 mmHg  RV EDP 6 mmHg  PA Systolic Pressure 33 mmHg  PA Diastolic Pressure 9 mmHg  PA Mean 22 mmHg  PW A Wave 13 mmHg  PW V Wave 17 mmHg  PW Mean 10 mmHg  AO Systolic Pressure 110 mmHg  AO Diastolic Pressure 69 mmHg  AO Mean 92 mmHg  LV Systolic Pressure 105 mmHg  LV Diastolic Pressure 5 mmHg  LV EDP 11 mmHg  Arterial Occlusion Pressure Extended Systolic Pressure 106 mmHg  Arterial Occlusion Pressure Extended Diastolic Pressure 72 mmHg  Arterial Occlusion Pressure Extended Mean Pressure 88 mmHg  Left Ventricular Apex Extended Systolic Pressure 105 mmHg  Left Ventricular Apex Extended Diastolic Pressure 5 mmHg  Left Ventricular Apex Extended EDP Pressure 12 mmHg  QP/QS 1  TPVR Index 9.95 HRUI  TSVR Index 41.6 HRUI  PVR SVR Ratio 0.14  TPVR/TSVR Ratio 0.24    CT ANGIOGRAPHY CHEST, ABDOMEN AND PELVIS  TECHNIQUE: Multidetector CT imaging through the chest, abdomen and pelvis was performed using the standard protocol during  bolus administration of intravenous contrast. Multiplanar reconstructed images and MIPs were obtained and reviewed to evaluate the vascular anatomy.  CONTRAST: 80 mL of Isovue 370 intravenously.  COMPARISON: None.  FINDINGS: CTA CHEST  FINDINGS  No pneumothorax or pleural effusion is noted. Minimal subsegmental atelectasis is noted in both lung bases. There is no evidence of thoracic aortic dissection or aneurysm. Mild coronary artery calcifications are noted. No mass or adenopathy is noted in the mediastinum. Great vessels are widely patent without significant stenosis. No significant osseous abnormality is noted.  Review of the MIP images confirms the above findings.  CTA ABDOMEN AND PELVIS FINDINGS  There is no evidence of abdominal aortic aneurysm or dissection. The mesenteric and renal arteries are widely patent without significant stenosis. Visualized iliac arteries are widely patent. Status post cholecystectomy. The liver, spleen and pancreas unremarkable. Adrenal glands and kidneys appear normal. No hydronephrosis or renal obstruction is noted. No renal or ureteral calculi are noted. There is no evidence of bowel obstruction. The appendix appears normal. Sigmoid diverticulosis is noted without inflammation. Urinary bladder appears normal. No significant adenopathy is noted.  Review of the MIP images confirms the above findings.  IMPRESSION: There is no evidence of thoracic or abdominal aortic aneurysm or dissection.  Mild coronary artery calcifications are noted suggesting coronary artery disease.  Sigmoid diverticulosis is noted without inflammation.   Electronically Signed By: Lupita Raider, M.D. On: 02/09/2016 12:24    Impression:  Patient has long-standing history of atrial fibrillation that dates back more than 20 years ago. Recently he has had worsening symptoms of exertional shortness of breath, fatigue, and palpitations in the setting of recurrent persistent atrial fibrillation that has failed medical therapy with multiple agents and catheter-based ablation on 2 previous occasions. Comorbid medical conditions include history of chronic combined systolic and diastolic  congestive heart failure, obesity, obstructive sleep apnea, and hypertension. I have personally reviewed the patient's recent transesophageal echocardiogram and diagnostic catheterization. TEE reveals type Idysfunction of the mitral valve with moderate central mitral regurgitation. There is mild left atrial enlargement and mild left ventricular hypertrophy. Diagnostic cardiac catheterization is notable for the absence of significant coronary artery disease. Options include long-term medical therapy versus surgical ablation. I feel the patient is an acceptable candidate for Maze procedure.  CT angiography reveals no contraindications to peripheral arterial cannulation for surgery   Plan:  The patient was againcounseled at length regarding the indications, risks and potential benefits of maze procedure. The rationale for elective surgery has been explained, including a comparison between surgery and continued medical therapy. The relative risks and benefits of performing a maze procedure was discussed at length, including the expected likelihood of long term freedom from recurrent symptomatic atrial fibrillation and/or atrial flutter as well as the risks of possible need for permanent pacemaker placement. Alternative surgical approaches have been discussed including a comparison between conventional sternotomy and minimally-invasive techniques. The relative risks and benefits of each have been reviewed as they pertain to the patient's specific circumstances, and all of their questions have been addressed. We also discussed the fact that the patient has at least mild to moderate mitral regurgitation secondary to type I mitral valve dysfunction. Under the circumstances we would plan to reevaluate the severity of mitral regurgitation at the time of surgery and proceed with mitral valve repair (ring annuloplasty) if necessary.      The patient understands and accepts all potential risks of surgery  including but not limited to risk of death, stroke or  other neurologic complication, myocardial infarction, congestive heart failure, respiratory failure, renal failure, bleeding requiring transfusion and/or reexploration, arrhythmia, infection or other wound complications, pneumonia, pleural and/or pericardial effusion, pulmonary embolus, aortic dissection or other major vascular complication, or delayed complications related to valve repair or replacement including but not limited to structural valve deterioration and failure, thrombosis, embolization, endocarditis, or paravalvular leak.  Specific risks potentially related to the minimally-invasive approach were discussed at length, including but not limited to risk of conversion to full or partial sternotomy, aortic dissection or other major vascular complication, unilateral acute lung injury or pulmonary edema, phrenic nerve dysfunction or paralysis, rib fracture, chronic pain, lung hernia, or lymphocele.  Expectations for the patient's postoperative convalescence at been discussed. All of his questions have been answered.     Salvatore Decent. Cornelius Moras, MD 03/18/2016 10:01 AM

## 2016-03-19 MED ORDER — DEXTROSE 5 % IV SOLN
0.0000 ug/min | INTRAVENOUS | Status: DC
Start: 2016-03-20 — End: 2016-03-20
  Filled 2016-03-19: qty 4

## 2016-03-19 MED ORDER — PLASMA-LYTE 148 IV SOLN
INTRAVENOUS | Status: DC
  Filled 2016-03-19: qty 2.5

## 2016-03-19 MED ORDER — VANCOMYCIN HCL 10 G IV SOLR
1250.0000 mg | INTRAVENOUS | Status: DC
  Filled 2016-03-19: qty 1250

## 2016-03-19 MED ORDER — VANCOMYCIN HCL 1000 MG IV SOLR
INTRAVENOUS | Status: AC
  Administered 2016-03-20: 1000 mL
  Filled 2016-03-19: qty 1000

## 2016-03-19 MED ORDER — SODIUM CHLORIDE 0.9 % IV SOLN
INTRAVENOUS | Status: AC
  Administered 2016-03-20: .7 [IU]/h via INTRAVENOUS
  Filled 2016-03-19: qty 2.5

## 2016-03-19 MED ORDER — AMINOCAPROIC ACID 250 MG/ML IV SOLN
INTRAVENOUS | Status: AC
  Administered 2016-03-20: 69.8 mL/h via INTRAVENOUS
  Filled 2016-03-19 (×2): qty 40

## 2016-03-19 MED ORDER — LEVOFLOXACIN IN D5W 500 MG/100ML IV SOLN
500.0000 mg | INTRAVENOUS | Status: AC
  Administered 2016-03-20: 500 mg via INTRAVENOUS
  Filled 2016-03-19: qty 100

## 2016-03-19 MED ORDER — DOPAMINE-DEXTROSE 3.2-5 MG/ML-% IV SOLN
0.0000 ug/kg/min | INTRAVENOUS | Status: DC
Start: 2016-03-20 — End: 2016-03-20
  Filled 2016-03-19 (×2): qty 250

## 2016-03-19 MED ORDER — PHENYLEPHRINE HCL 10 MG/ML IJ SOLN
30.0000 ug/min | INTRAMUSCULAR | Status: AC
  Administered 2016-03-20: 40 ug/min via INTRAVENOUS
  Filled 2016-03-19: qty 2

## 2016-03-19 MED ORDER — HEPARIN SODIUM (PORCINE) 1000 UNIT/ML IJ SOLN
INTRAMUSCULAR | Status: DC
  Filled 2016-03-19: qty 30

## 2016-03-19 MED ORDER — MAGNESIUM SULFATE 50 % IJ SOLN
40.0000 meq | INTRAMUSCULAR | Status: DC
  Filled 2016-03-19: qty 10

## 2016-03-19 MED ORDER — CHLORHEXIDINE GLUCONATE 0.12 % MT SOLN
15.0000 mL | Freq: Once | OROMUCOSAL | Status: AC
  Administered 2016-03-20: 15 mL via OROMUCOSAL
  Filled 2016-03-19: qty 15

## 2016-03-19 MED ORDER — POTASSIUM CHLORIDE 2 MEQ/ML IV SOLN
80.0000 meq | INTRAVENOUS | Status: DC
  Filled 2016-03-19: qty 40

## 2016-03-19 MED ORDER — NITROGLYCERIN IN D5W 200-5 MCG/ML-% IV SOLN
2.0000 ug/min | INTRAVENOUS | Status: DC
  Filled 2016-03-19: qty 250

## 2016-03-19 MED ORDER — METOPROLOL TARTRATE 12.5 MG HALF TABLET: 12.5000 mg | ORAL_TABLET | Freq: Once | ORAL | Status: DC

## 2016-03-19 MED ORDER — VANCOMYCIN HCL 10 G IV SOLR
1500.0000 mg | INTRAVENOUS | Status: AC
  Administered 2016-03-20: 1500 mg via INTRAVENOUS
  Filled 2016-03-19: qty 1500

## 2016-03-19 MED ORDER — DEXMEDETOMIDINE HCL IN NACL 400 MCG/100ML IV SOLN
0.1000 ug/kg/h | INTRAVENOUS | Status: AC
  Administered 2016-03-20: .5 ug/kg/h via INTRAVENOUS
  Filled 2016-03-19: qty 100

## 2016-03-19 NOTE — Progress Notes (Signed)
Anesthesia chart: Patient is a 55 year old male scheduled for minimally invasive maze procedure, possible mitral valve repair on 03/20/2016 by [name].  See Dr. Orvan July H&P regarding patient's history and pertinent cardiac study results up to patient's PAT visit. He has afib that has failed multiple agents, cardioversion, and catheter based ablation X 2. He has mild to moderate MR by TEE and non-obstructive CAD by cath.  PCP is Roxanne Mins, PA-C. Cardiologist is Dr. Hillis Range.  03/18/16 Carotid U/S: Summary: - The vertebral arteries appear patent with antegrade flow. - Findings consistent with 1-39 percent stenosis involving the   right internal carotid artery and the left internal carotid   artery.  03/18/16 PFTs: FVC 4.32 (77%), FEV1 3.48 (82%), DLCOunc 25.06 (66%).  Preoperative CXR and labs noted. Cr 1.28, glucose 62. A1c was not run because there was insufficient blood--to be done on the day of surgery. There is no history of DM. He is a first case.  If no acute changes then I would anticipate that he could proceed as planned. His glucose was in the 60's at PAT. It can be monitored perioperatively. Further evaluation by his surgeon and anesthesiologist on the day of surgery.  Velna Ochs [facility] Short Stay Center/Anesthesiology Phone [phone] 03/19/2016 10:10 AM

## 2016-03-19 NOTE — Anesthesia Preprocedure Evaluation (Addendum)
Anesthesia Evaluation  Patient identified by MRN, date of birth, ID band Patient awake    Reviewed: Allergy & Precautions, NPO status , Patient's Chart, lab work & pertinent test results  Airway Mallampati: II  TM Distance: >3 FB Neck ROM: Full    Dental  (+) Dental Advisory Given   Pulmonary shortness of breath, sleep apnea ,    breath sounds clear to auscultation       Cardiovascular hypertension, Pt. on medications +CHF   Rhythm:Regular Rate:Normal  12/2015: Left ventricle: The cavity size was normal. Wall thickness was   normal. Systolic function was mildly reduced. The estimated   ejection fraction was in the range of 45% to 50%. Mild diffuse   hypokinesis. - Aortic valve: There was no stenosis. - Aorta: Normal caliber thoracic aorta with minimal plaque. - Mitral valve: There was mild to moderate regurgitation. - Left atrium: The atrium was mildly dilated. No evidence of   thrombus in the atrial cavity or appendage. - Right ventricle: The cavity size was normal. Systolic function   was normal. - Right atrium: No evidence of thrombus in the atrial cavity or   appendage. - Atrial septum: No defect or patent foramen ovale was identified.   Echo contrast study showed no right-to-left atrial level shunt,   at baseline or with provocation. - Tricuspid valve: Peak RV-RA gradient (S): 19 mm Hg.   Neuro/Psych Anxiety Depression TIA   GI/Hepatic Neg liver ROS, GERD  ,  Endo/Other  negative endocrine ROS  Renal/GU Renal disease     Musculoskeletal  (+) Arthritis ,   Abdominal   Peds  Hematology negative hematology ROS (+)   Anesthesia Other Findings   Reproductive/Obstetrics                            Lab Results  Component Value Date   WBC 6.1 03/18/2016   HGB 15.2 03/18/2016   HCT 46.0 03/18/2016   MCV 92.7 03/18/2016   PLT 169 03/18/2016   Lab Results  Component Value Date   CREATININE 1.28 (H) 03/18/2016   BUN 24 (H) 03/18/2016   NA 140 03/18/2016   K 4.5 03/18/2016   CL 111 03/18/2016   CO2 20 (L) 03/18/2016    Anesthesia Physical Anesthesia Plan  ASA: III  Anesthesia Plan: General   Post-op Pain Management:    Induction: Intravenous  Airway Management Planned: Double Lumen EBT  Additional Equipment: Arterial line, TEE, CVP, PA Cath and Ultrasound Guidance Line Placement  Intra-op Plan:   Post-operative Plan: Post-operative intubation/ventilation  Informed Consent: I have reviewed the patients History and Physical, chart, labs and discussed the procedure including the risks, benefits and alternatives for the proposed anesthesia with the patient or authorized representative who has indicated his/her understanding and acceptance.   Dental advisory given  Plan Discussed with: CRNA  Anesthesia Plan Comments:        Anesthesia Quick Evaluation

## 2016-03-20 ENCOUNTER — Inpatient Hospital Stay (HOSPITAL_COMMUNITY): Payer: BLUE CROSS/BLUE SHIELD

## 2016-03-20 ENCOUNTER — Inpatient Hospital Stay (HOSPITAL_COMMUNITY): Payer: BLUE CROSS/BLUE SHIELD | Admitting: Certified Registered Nurse Anesthetist

## 2016-03-20 ENCOUNTER — Inpatient Hospital Stay (HOSPITAL_COMMUNITY)
Admission: RE | Admit: 2016-03-20 | Discharge: 2016-03-26 | DRG: 229 | Disposition: A | Discharge status: Home / Self Care | Payer: BLUE CROSS/BLUE SHIELD | Source: Ambulatory Visit | Attending: Thoracic Surgery (Cardiothoracic Vascular Surgery) | Admitting: Thoracic Surgery (Cardiothoracic Vascular Surgery)

## 2016-03-20 ENCOUNTER — Encounter (HOSPITAL_COMMUNITY): Payer: Self-pay | Admitting: Surgery

## 2016-03-20 ENCOUNTER — Inpatient Hospital Stay (HOSPITAL_COMMUNITY): Payer: BLUE CROSS/BLUE SHIELD | Admitting: Vascular Surgery

## 2016-03-20 ENCOUNTER — Encounter (HOSPITAL_COMMUNITY)
Admission: RE | Disposition: A | Discharge status: Home / Self Care | Payer: Self-pay | Source: Ambulatory Visit | Attending: Thoracic Surgery (Cardiothoracic Vascular Surgery)

## 2016-03-20 DIAGNOSIS — D62 Acute posthemorrhagic anemia: Secondary | ICD-10-CM | POA: Diagnosis not present

## 2016-03-20 DIAGNOSIS — Z9842 Cataract extraction status, left eye: Secondary | ICD-10-CM | POA: Diagnosis not present

## 2016-03-20 DIAGNOSIS — I5042 Chronic combined systolic (congestive) and diastolic (congestive) heart failure: Secondary | ICD-10-CM | POA: Diagnosis present

## 2016-03-20 DIAGNOSIS — K219 Gastro-esophageal reflux disease without esophagitis: Secondary | ICD-10-CM | POA: Diagnosis present

## 2016-03-20 DIAGNOSIS — Z9841 Cataract extraction status, right eye: Secondary | ICD-10-CM | POA: Diagnosis not present

## 2016-03-20 DIAGNOSIS — Z6837 Body mass index (BMI) 37.0-37.9, adult: Secondary | ICD-10-CM | POA: Diagnosis not present

## 2016-03-20 DIAGNOSIS — Z8679 Personal history of other diseases of the circulatory system: Secondary | ICD-10-CM

## 2016-03-20 DIAGNOSIS — J9811 Atelectasis: Secondary | ICD-10-CM | POA: Diagnosis not present

## 2016-03-20 DIAGNOSIS — Z8673 Personal history of transient ischemic attack (TIA), and cerebral infarction without residual deficits: Secondary | ICD-10-CM | POA: Diagnosis not present

## 2016-03-20 DIAGNOSIS — E669 Obesity, unspecified: Secondary | ICD-10-CM | POA: Diagnosis present

## 2016-03-20 DIAGNOSIS — M109 Gout, unspecified: Secondary | ICD-10-CM | POA: Diagnosis present

## 2016-03-20 DIAGNOSIS — F419 Anxiety disorder, unspecified: Secondary | ICD-10-CM | POA: Diagnosis present

## 2016-03-20 DIAGNOSIS — Z79899 Other long term (current) drug therapy: Secondary | ICD-10-CM

## 2016-03-20 DIAGNOSIS — D696 Thrombocytopenia, unspecified: Secondary | ICD-10-CM | POA: Diagnosis not present

## 2016-03-20 DIAGNOSIS — G4733 Obstructive sleep apnea (adult) (pediatric): Secondary | ICD-10-CM | POA: Diagnosis present

## 2016-03-20 DIAGNOSIS — I4891 Unspecified atrial fibrillation: Secondary | ICD-10-CM

## 2016-03-20 DIAGNOSIS — F329 Major depressive disorder, single episode, unspecified: Secondary | ICD-10-CM | POA: Diagnosis present

## 2016-03-20 DIAGNOSIS — G4730 Sleep apnea, unspecified: Secondary | ICD-10-CM | POA: Diagnosis present

## 2016-03-20 DIAGNOSIS — Z880 Allergy status to penicillin: Secondary | ICD-10-CM

## 2016-03-20 DIAGNOSIS — E7800 Pure hypercholesterolemia, unspecified: Secondary | ICD-10-CM | POA: Diagnosis present

## 2016-03-20 DIAGNOSIS — I428 Other cardiomyopathies: Secondary | ICD-10-CM | POA: Diagnosis present

## 2016-03-20 DIAGNOSIS — I4819 Other persistent atrial fibrillation: Secondary | ICD-10-CM | POA: Diagnosis present

## 2016-03-20 DIAGNOSIS — K7460 Unspecified cirrhosis of liver: Secondary | ICD-10-CM | POA: Diagnosis present

## 2016-03-20 DIAGNOSIS — Z9689 Presence of other specified functional implants: Secondary | ICD-10-CM

## 2016-03-20 DIAGNOSIS — I481 Persistent atrial fibrillation: Secondary | ICD-10-CM | POA: Diagnosis not present

## 2016-03-20 DIAGNOSIS — I110 Hypertensive heart disease with heart failure: Secondary | ICD-10-CM | POA: Diagnosis present

## 2016-03-20 DIAGNOSIS — Z9889 Other specified postprocedural states: Secondary | ICD-10-CM

## 2016-03-20 DIAGNOSIS — I10 Essential (primary) hypertension: Secondary | ICD-10-CM | POA: Diagnosis present

## 2016-03-20 DIAGNOSIS — I340 Nonrheumatic mitral (valve) insufficiency: Secondary | ICD-10-CM | POA: Diagnosis present

## 2016-03-20 DIAGNOSIS — R0602 Shortness of breath: Secondary | ICD-10-CM | POA: Diagnosis present

## 2016-03-20 DIAGNOSIS — R002 Palpitations: Secondary | ICD-10-CM | POA: Diagnosis present

## 2016-03-20 HISTORY — DX: Other specified postprocedural states: Z98.890

## 2016-03-20 HISTORY — DX: Personal history of other diseases of the circulatory system: Z86.79

## 2016-03-20 HISTORY — PX: TEE WITHOUT CARDIOVERSION: SHX5443

## 2016-03-20 HISTORY — PX: MINIMALLY INVASIVE MAZE PROCEDURE: SHX6244

## 2016-03-20 LAB — GLUCOSE, CAPILLARY
GLUCOSE-CAPILLARY: 109 mg/dL — AB (ref 65–99)
GLUCOSE-CAPILLARY: 123 mg/dL — AB (ref 65–99)
GLUCOSE-CAPILLARY: 95 mg/dL (ref 65–99)
GLUCOSE-CAPILLARY: 98 mg/dL (ref 65–99)
GLUCOSE-CAPILLARY: 99 mg/dL (ref 65–99)
Glucose-Capillary: 109 mg/dL — ABNORMAL HIGH (ref 65–99)
Glucose-Capillary: 106 mg/dL — ABNORMAL HIGH (ref 65–99)
Glucose-Capillary: 105 mg/dL — ABNORMAL HIGH (ref 65–99)

## 2016-03-20 LAB — POCT I-STAT 3, ART BLOOD GAS (G3+)
ACID-BASE DEFICIT: 4 mmol/L — AB (ref 0.0–2.0)
ACID-BASE EXCESS: 1 mmol/L (ref 0.0–2.0)
Acid-base deficit: 4 mmol/L — ABNORMAL HIGH (ref 0.0–2.0)
Acid-base deficit: 4 mmol/L — ABNORMAL HIGH (ref 0.0–2.0)
Acid-base deficit: 5 mmol/L — ABNORMAL HIGH (ref 0.0–2.0)
BICARBONATE: 21 mmol/L (ref 20.0–28.0)
BICARBONATE: 21.9 mmol/L (ref 20.0–28.0)
Bicarbonate: 25.8 mmol/L (ref 20.0–28.0)
Bicarbonate: 22.6 mmol/L (ref 20.0–28.0)
Bicarbonate: 21.6 mmol/L (ref 20.0–28.0)
O2 Saturation: 100 %
O2 Saturation: 93 %
O2 Saturation: 91 %
O2 Saturation: 97 %
O2 Saturation: 95 %
PCO2 ART: 38.8 mmHg (ref 32.0–48.0)
PCO2 ART: 44.1 mmHg (ref 32.0–48.0)
PCO2 ART: 45.9 mmHg (ref 32.0–48.0)
PH ART: 7.399 (ref 7.350–7.450)
PH ART: 7.342 — AB (ref 7.350–7.450)
PH ART: 7.293 — AB (ref 7.350–7.450)
PH ART: 7.306 — AB (ref 7.350–7.450)
PH ART: 7.284 — AB (ref 7.350–7.450)
PO2 ART: 467 mmHg — AB (ref 83.0–108.0)
PO2 ART: 68 mmHg — AB (ref 83.0–108.0)
PO2 ART: 104 mmHg (ref 83.0–108.0)
Patient temperature: 36.8
Patient temperature: 37.51
TCO2: 27 mmol/L (ref 0–100)
TCO2: 22 mmol/L (ref 0–100)
TCO2: 24 mmol/L (ref 0–100)
TCO2: 23 mmol/L (ref 0–100)
TCO2: 23 mmol/L (ref 0–100)
pCO2 arterial: 41.7 mmHg (ref 32.0–48.0)
pCO2 arterial: 46.5 mmHg (ref 32.0–48.0)
pO2, Arterial: 71 mmHg — ABNORMAL LOW (ref 83.0–108.0)
pO2, Arterial: 91 mmHg (ref 83.0–108.0)

## 2016-03-20 LAB — POCT I-STAT, CHEM 8
BUN: 21 mg/dL — ABNORMAL HIGH (ref 6–20)
BUN: 20 mg/dL (ref 6–20)
BUN: 18 mg/dL (ref 6–20)
BUN: 20 mg/dL (ref 6–20)
BUN: 19 mg/dL (ref 6–20)
BUN: 19 mg/dL (ref 6–20)
CALCIUM ION: 1.23 mmol/L (ref 1.15–1.40)
CALCIUM ION: 1.22 mmol/L (ref 1.15–1.40)
CALCIUM ION: 1.12 mmol/L — AB (ref 1.15–1.40)
CHLORIDE: 103 mmol/L (ref 101–111)
CHLORIDE: 107 mmol/L (ref 101–111)
CHLORIDE: 106 mmol/L (ref 101–111)
CREATININE: 0.9 mg/dL (ref 0.61–1.24)
Calcium, Ion: 1.11 mmol/L — ABNORMAL LOW (ref 1.15–1.40)
Calcium, Ion: 1.11 mmol/L — ABNORMAL LOW (ref 1.15–1.40)
Calcium, Ion: 1.16 mmol/L (ref 1.15–1.40)
Chloride: 105 mmol/L (ref 101–111)
Chloride: 103 mmol/L (ref 101–111)
Chloride: 106 mmol/L (ref 101–111)
Creatinine, Ser: 1.1 mg/dL (ref 0.61–1.24)
Creatinine, Ser: 0.9 mg/dL (ref 0.61–1.24)
Creatinine, Ser: 0.8 mg/dL (ref 0.61–1.24)
Creatinine, Ser: 0.8 mg/dL (ref 0.61–1.24)
Creatinine, Ser: 1 mg/dL (ref 0.61–1.24)
GLUCOSE: 96 mg/dL (ref 65–99)
Glucose, Bld: 105 mg/dL — ABNORMAL HIGH (ref 65–99)
Glucose, Bld: 126 mg/dL — ABNORMAL HIGH (ref 65–99)
Glucose, Bld: 149 mg/dL — ABNORMAL HIGH (ref 65–99)
Glucose, Bld: 153 mg/dL — ABNORMAL HIGH (ref 65–99)
Glucose, Bld: 100 mg/dL — ABNORMAL HIGH (ref 65–99)
HCT: 38 % — ABNORMAL LOW (ref 39.0–52.0)
HCT: 37 % — ABNORMAL LOW (ref 39.0–52.0)
HEMATOCRIT: 31 % — AB (ref 39.0–52.0)
HEMATOCRIT: 31 % — AB (ref 39.0–52.0)
HEMATOCRIT: 34 % — AB (ref 39.0–52.0)
HEMATOCRIT: 42 % (ref 39.0–52.0)
HEMOGLOBIN: 12.9 g/dL — AB (ref 13.0–17.0)
HEMOGLOBIN: 10.5 g/dL — AB (ref 13.0–17.0)
HEMOGLOBIN: 10.5 g/dL — AB (ref 13.0–17.0)
Hemoglobin: 12.6 g/dL — ABNORMAL LOW (ref 13.0–17.0)
Hemoglobin: 11.6 g/dL — ABNORMAL LOW (ref 13.0–17.0)
Hemoglobin: 14.3 g/dL (ref 13.0–17.0)
POTASSIUM: 4.7 mmol/L (ref 3.5–5.1)
POTASSIUM: 4.3 mmol/L (ref 3.5–5.1)
Potassium: 4.5 mmol/L (ref 3.5–5.1)
Potassium: 4.4 mmol/L (ref 3.5–5.1)
Potassium: 4.2 mmol/L (ref 3.5–5.1)
Potassium: 5 mmol/L (ref 3.5–5.1)
SODIUM: 140 mmol/L (ref 135–145)
SODIUM: 141 mmol/L (ref 135–145)
SODIUM: 140 mmol/L (ref 135–145)
SODIUM: 142 mmol/L (ref 135–145)
SODIUM: 142 mmol/L (ref 135–145)
Sodium: 141 mmol/L (ref 135–145)
TCO2: 26 mmol/L (ref 0–100)
TCO2: 27 mmol/L (ref 0–100)
TCO2: 28 mmol/L (ref 0–100)
TCO2: 29 mmol/L (ref 0–100)
TCO2: 23 mmol/L (ref 0–100)
TCO2: 23 mmol/L (ref 0–100)

## 2016-03-20 LAB — APTT: aPTT: 32 seconds (ref 24–36)

## 2016-03-20 LAB — CBC
HCT: 42.9 % (ref 39.0–52.0)
HEMATOCRIT: 43.4 % (ref 39.0–52.0)
HEMOGLOBIN: 14.2 g/dL (ref 13.0–17.0)
Hemoglobin: 13.8 g/dL (ref 13.0–17.0)
MCH: 30.3 pg (ref 26.0–34.0)
MCH: 30.3 pg (ref 26.0–34.0)
MCHC: 32.7 g/dL (ref 30.0–36.0)
MCHC: 32.2 g/dL (ref 30.0–36.0)
MCV: 92.7 fL (ref 78.0–100.0)
MCV: 94.3 fL (ref 78.0–100.0)
PLATELETS: 149 10*3/uL — AB (ref 150–400)
Platelets: 143 10*3/uL — ABNORMAL LOW (ref 150–400)
RBC: 4.68 MIL/uL (ref 4.22–5.81)
RBC: 4.55 MIL/uL (ref 4.22–5.81)
RDW: 13.5 % (ref 11.5–15.5)
RDW: 13.5 % (ref 11.5–15.5)
WBC: 16.1 10*3/uL — ABNORMAL HIGH (ref 4.0–10.5)
WBC: 13.8 10*3/uL — AB (ref 4.0–10.5)

## 2016-03-20 LAB — PROTIME-INR
INR: 1.36
Prothrombin Time: 16.9 seconds — ABNORMAL HIGH (ref 11.4–15.2)

## 2016-03-20 LAB — POCT I-STAT 4, (NA,K, GLUC, HGB,HCT)
GLUCOSE: 113 mg/dL — AB (ref 65–99)
HCT: 43 % (ref 39.0–52.0)
HEMOGLOBIN: 14.6 g/dL (ref 13.0–17.0)
POTASSIUM: 4.4 mmol/L (ref 3.5–5.1)
Sodium: 144 mmol/L (ref 135–145)

## 2016-03-20 LAB — HEMOGLOBIN AND HEMATOCRIT, BLOOD
HEMATOCRIT: 31.7 % — AB (ref 39.0–52.0)
HEMOGLOBIN: 10.6 g/dL — AB (ref 13.0–17.0)

## 2016-03-20 LAB — CREATININE, SERUM
CREATININE: 0.94 mg/dL (ref 0.61–1.24)
GFR calc Af Amer: >60 mL/min (ref >60)
GFR calc non Af Amer: >60 mL/min (ref >60)

## 2016-03-20 LAB — PLATELET COUNT: PLATELETS: 135 10*3/uL — AB (ref 150–400)

## 2016-03-20 LAB — MAGNESIUM: Magnesium: 2.8 mg/dL — ABNORMAL HIGH (ref 1.7–2.4)

## 2016-03-20 SURGERY — MAZE PROCEDURE, CARDIAC, MINIMALLY INVASIVE
Anesthesia: General | Site: Chest

## 2016-03-20 MED ORDER — INSULIN REGULAR BOLUS VIA INFUSION
0.0000 [IU] | Freq: Three times a day (TID) | INTRAVENOUS | Status: DC
  Filled 2016-03-20: qty 10

## 2016-03-20 MED ORDER — ACETAMINOPHEN 160 MG/5ML PO SOLN: 1000.0000 mg | Freq: Four times a day (QID) | ORAL | Status: DC

## 2016-03-20 MED ORDER — PROPOFOL 10 MG/ML IV BOLUS
INTRAVENOUS | Status: DC | PRN
  Administered 2016-03-20: 100 mg via INTRAVENOUS

## 2016-03-20 MED ORDER — MIDAZOLAM HCL 5 MG/5ML IJ SOLN
INTRAMUSCULAR | Status: DC | PRN
  Administered 2016-03-20: 5 mg via INTRAVENOUS
  Administered 2016-03-20: 2 mg via INTRAVENOUS
  Administered 2016-03-20: 3 mg via INTRAVENOUS
  Administered 2016-03-20: 2 mg via INTRAVENOUS

## 2016-03-20 MED ORDER — ACETAMINOPHEN 500 MG PO TABS
1000.0000 mg | ORAL_TABLET | Freq: Four times a day (QID) | ORAL | Status: AC
  Administered 2016-03-20 – 2016-03-25 (×17): 1000 mg via ORAL
  Filled 2016-03-20 (×17): qty 2

## 2016-03-20 MED ORDER — LACTATED RINGERS IV SOLN
INTRAVENOUS | Status: DC
  Administered 2016-03-20: 20 mL via INTRAVENOUS

## 2016-03-20 MED ORDER — ALBUMIN HUMAN 5 % IV SOLN
250.0000 mL | INTRAVENOUS | Status: AC | PRN
  Administered 2016-03-20 (×2): 250 mL via INTRAVENOUS

## 2016-03-20 MED ORDER — DOCUSATE SODIUM 100 MG PO CAPS
200.0000 mg | ORAL_CAPSULE | Freq: Every day | ORAL | Status: DC
  Administered 2016-03-21 – 2016-03-26 (×6): 200 mg via ORAL
  Filled 2016-03-20 (×6): qty 2

## 2016-03-20 MED ORDER — SUCCINYLCHOLINE CHLORIDE 20 MG/ML IJ SOLN
INTRAMUSCULAR | Status: DC | PRN
  Administered 2016-03-20: 120 mg via INTRAVENOUS

## 2016-03-20 MED ORDER — FENTANYL CITRATE (PF) 250 MCG/5ML IJ SOLN
INTRAMUSCULAR | Status: DC | PRN
  Administered 2016-03-20: 100 ug via INTRAVENOUS
  Administered 2016-03-20: 250 ug via INTRAVENOUS
  Administered 2016-03-20 (×2): 100 ug via INTRAVENOUS
  Administered 2016-03-20: 200 ug via INTRAVENOUS
  Administered 2016-03-20: 100 ug via INTRAVENOUS
  Administered 2016-03-20: 50 ug via INTRAVENOUS
  Administered 2016-03-20: 100 ug via INTRAVENOUS
  Administered 2016-03-20: 150 ug via INTRAVENOUS

## 2016-03-20 MED ORDER — SODIUM CHLORIDE 0.9 % IV SOLN: 250.0000 mL | INTRAVENOUS | Status: DC

## 2016-03-20 MED ORDER — PHENYLEPHRINE HCL 10 MG/ML IJ SOLN
INTRAMUSCULAR | Status: DC | PRN
  Administered 2016-03-20 (×2): 40 ug via INTRAVENOUS
  Administered 2016-03-20 (×2): 80 ug via INTRAVENOUS

## 2016-03-20 MED ORDER — NITROGLYCERIN IN D5W 200-5 MCG/ML-% IV SOLN: 0.0000 ug/min | INTRAVENOUS | Status: DC

## 2016-03-20 MED ORDER — ACETAMINOPHEN 160 MG/5ML PO SOLN
650.0000 mg | Freq: Once | ORAL | Status: AC
  Administered 2016-03-20: 650 mg

## 2016-03-20 MED ORDER — MIDAZOLAM HCL 2 MG/2ML IJ SOLN
INTRAMUSCULAR | Status: AC
  Filled 2016-03-20: qty 2

## 2016-03-20 MED ORDER — PROTAMINE SULFATE 10 MG/ML IV SOLN
INTRAVENOUS | Status: DC | PRN
  Administered 2016-03-20: 20 mg via INTRAVENOUS
  Administered 2016-03-20: 50 mg via INTRAVENOUS
  Administered 2016-03-20 (×2): 40 mg via INTRAVENOUS
  Administered 2016-03-20: 50 mg via INTRAVENOUS
  Administered 2016-03-20: 30 mg via INTRAVENOUS
  Administered 2016-03-20: 80 mg via INTRAVENOUS
  Administered 2016-03-20: 20 mg via INTRAVENOUS
  Administered 2016-03-20: 50 mg via INTRAVENOUS

## 2016-03-20 MED ORDER — ONDANSETRON HCL 4 MG/2ML IJ SOLN
4.0000 mg | Freq: Four times a day (QID) | INTRAMUSCULAR | Status: DC | PRN
  Administered 2016-03-21 – 2016-03-23 (×3): 4 mg via INTRAVENOUS
  Filled 2016-03-20 (×3): qty 2

## 2016-03-20 MED ORDER — SODIUM CHLORIDE 0.45 % IV SOLN: INTRAVENOUS | Status: DC | PRN

## 2016-03-20 MED ORDER — ROCURONIUM BROMIDE 100 MG/10ML IV SOLN
INTRAVENOUS | Status: DC | PRN
  Administered 2016-03-20: 50 mg via INTRAVENOUS
  Administered 2016-03-20: 40 mg via INTRAVENOUS
  Administered 2016-03-20: 30 mg via INTRAVENOUS

## 2016-03-20 MED ORDER — FAMOTIDINE IN NACL 20-0.9 MG/50ML-% IV SOLN
20.0000 mg | Freq: Two times a day (BID) | INTRAVENOUS | Status: DC
  Administered 2016-03-20: 20 mg via INTRAVENOUS

## 2016-03-20 MED ORDER — TRAMADOL HCL 50 MG PO TABS
50.0000 mg | ORAL_TABLET | ORAL | Status: DC | PRN
  Administered 2016-03-22 – 2016-03-23 (×2): 100 mg via ORAL
  Filled 2016-03-20 (×2): qty 2

## 2016-03-20 MED ORDER — VANCOMYCIN HCL IN DEXTROSE 1-5 GM/200ML-% IV SOLN
1000.0000 mg | Freq: Once | INTRAVENOUS | Status: AC
  Administered 2016-03-20: 1000 mg via INTRAVENOUS
  Filled 2016-03-20: qty 200

## 2016-03-20 MED ORDER — SODIUM CHLORIDE 0.9 % IR SOLN
Status: DC | PRN
  Administered 2016-03-20: 5000 mL

## 2016-03-20 MED ORDER — LACTATED RINGERS IV SOLN: 500.0000 mL | Freq: Once | INTRAVENOUS | Status: DC | PRN

## 2016-03-20 MED ORDER — ASPIRIN EC 325 MG PO TBEC
325.0000 mg | DELAYED_RELEASE_TABLET | Freq: Every day | ORAL | Status: DC
  Administered 2016-03-21 – 2016-03-24 (×4): 325 mg via ORAL
  Filled 2016-03-20 (×4): qty 1

## 2016-03-20 MED ORDER — MUPIROCIN 2 % EX OINT
1.0000 "application " | TOPICAL_OINTMENT | Freq: Two times a day (BID) | CUTANEOUS | Status: DC
  Administered 2016-03-20: 1 via TOPICAL

## 2016-03-20 MED ORDER — BISACODYL 5 MG PO TBEC
10.0000 mg | DELAYED_RELEASE_TABLET | Freq: Every day | ORAL | Status: DC
  Administered 2016-03-21 – 2016-03-26 (×6): 10 mg via ORAL
  Filled 2016-03-20 (×6): qty 2

## 2016-03-20 MED ORDER — PROTAMINE SULFATE 10 MG/ML IV SOLN
INTRAVENOUS | Status: AC
  Filled 2016-03-20: qty 25

## 2016-03-20 MED ORDER — SODIUM CHLORIDE 0.9% FLUSH
3.0000 mL | Freq: Two times a day (BID) | INTRAVENOUS | Status: DC
  Administered 2016-03-21 – 2016-03-26 (×3): 3 mL via INTRAVENOUS

## 2016-03-20 MED ORDER — PHENYLEPHRINE HCL 10 MG/ML IJ SOLN
INTRAVENOUS | Status: DC | PRN
  Administered 2016-03-20: 40 ug/min via INTRAVENOUS

## 2016-03-20 MED ORDER — BISACODYL 10 MG RE SUPP: 10.0000 mg | Freq: Every day | RECTAL | Status: DC

## 2016-03-20 MED ORDER — HEPARIN SODIUM (PORCINE) 1000 UNIT/ML IJ SOLN
INTRAMUSCULAR | Status: DC | PRN
  Administered 2016-03-20: 38000 [IU] via INTRAVENOUS

## 2016-03-20 MED ORDER — SODIUM CHLORIDE 0.9 % IV SOLN
INTRAVENOUS | Status: DC
  Administered 2016-03-20: 1.4 [IU]/h via INTRAVENOUS
  Filled 2016-03-20: qty 2.5

## 2016-03-20 MED ORDER — CHLORHEXIDINE GLUCONATE 4 % EX LIQD
30.0000 mL | CUTANEOUS | Status: DC
Start: 2016-03-20 — End: 2016-03-20

## 2016-03-20 MED ORDER — PROPOFOL 10 MG/ML IV BOLUS
INTRAVENOUS | Status: AC
  Filled 2016-03-20: qty 40

## 2016-03-20 MED ORDER — MORPHINE SULFATE (PF) 2 MG/ML IV SOLN
1.0000 mg | INTRAVENOUS | Status: DC | PRN
  Administered 2016-03-21: 4 mg via INTRAVENOUS
  Filled 2016-03-20: qty 2

## 2016-03-20 MED ORDER — MUPIROCIN 2 % EX OINT
TOPICAL_OINTMENT | CUTANEOUS | Status: AC
  Filled 2016-03-20: qty 22

## 2016-03-20 MED ORDER — ASPIRIN 81 MG PO CHEW: 324.0000 mg | CHEWABLE_TABLET | Freq: Every day | ORAL | Status: DC

## 2016-03-20 MED ORDER — POTASSIUM CHLORIDE 10 MEQ/50ML IV SOLN: 10.0000 meq | INTRAVENOUS | Status: AC

## 2016-03-20 MED ORDER — SODIUM CHLORIDE 0.9 % IV SOLN
INTRAVENOUS | Status: DC | PRN
  Administered 2016-03-20 (×3): via INTRAVENOUS

## 2016-03-20 MED ORDER — ACETAMINOPHEN 650 MG RE SUPP: 650.0000 mg | Freq: Once | RECTAL | Status: AC

## 2016-03-20 MED ORDER — MAGNESIUM SULFATE 4 GM/100ML IV SOLN
4.0000 g | Freq: Once | INTRAVENOUS | Status: AC
  Administered 2016-03-20: 4 g via INTRAVENOUS
  Filled 2016-03-20: qty 100

## 2016-03-20 MED ORDER — OXYCODONE HCL 5 MG PO TABS
5.0000 mg | ORAL_TABLET | ORAL | Status: DC | PRN
  Administered 2016-03-21 – 2016-03-22 (×5): 10 mg via ORAL
  Administered 2016-03-23: 5 mg via ORAL
  Administered 2016-03-23 – 2016-03-24 (×2): 10 mg via ORAL
  Administered 2016-03-24: 5 mg via ORAL
  Administered 2016-03-24: 10 mg via ORAL
  Administered 2016-03-24 (×2): 5 mg via ORAL
  Administered 2016-03-24 – 2016-03-26 (×7): 10 mg via ORAL
  Filled 2016-03-20 (×3): qty 2
  Filled 2016-03-20: qty 1
  Filled 2016-03-20 (×5): qty 2
  Filled 2016-03-20: qty 1
  Filled 2016-03-20 (×6): qty 2
  Filled 2016-03-20: qty 1
  Filled 2016-03-20: qty 2
  Filled 2016-03-20: qty 1
  Filled 2016-03-20 (×2): qty 2

## 2016-03-20 MED ORDER — LACTATED RINGERS IV SOLN: INTRAVENOUS | Status: DC

## 2016-03-20 MED ORDER — PROTAMINE SULFATE 10 MG/ML IV SOLN
INTRAVENOUS | Status: AC
  Filled 2016-03-20: qty 15

## 2016-03-20 MED ORDER — ROCURONIUM BROMIDE 10 MG/ML (PF) SYRINGE
PREFILLED_SYRINGE | INTRAVENOUS | Status: AC
  Filled 2016-03-20: qty 10

## 2016-03-20 MED ORDER — FENTANYL CITRATE (PF) 250 MCG/5ML IJ SOLN
INTRAMUSCULAR | Status: AC
  Filled 2016-03-20: qty 25

## 2016-03-20 MED ORDER — MORPHINE SULFATE (PF) 2 MG/ML IV SOLN
1.0000 mg | INTRAVENOUS | Status: DC | PRN
  Administered 2016-03-20 – 2016-03-23 (×6): 2 mg via INTRAVENOUS
  Filled 2016-03-20 (×6): qty 1

## 2016-03-20 MED ORDER — HEPARIN SODIUM (PORCINE) 1000 UNIT/ML IJ SOLN
INTRAMUSCULAR | Status: AC
  Filled 2016-03-20: qty 1

## 2016-03-20 MED ORDER — LIDOCAINE 2% (20 MG/ML) 5 ML SYRINGE
INTRAMUSCULAR | Status: AC
  Filled 2016-03-20: qty 5

## 2016-03-20 MED ORDER — SODIUM CHLORIDE 0.9 % IV SOLN
INTRAVENOUS | Status: DC
  Administered 2016-03-20: 100 mL via INTRAVENOUS

## 2016-03-20 MED ORDER — MIDAZOLAM HCL 2 MG/2ML IJ SOLN: 2.0000 mg | INTRAMUSCULAR | Status: DC | PRN

## 2016-03-20 MED ORDER — CHLORHEXIDINE GLUCONATE 0.12 % MT SOLN
15.0000 mL | OROMUCOSAL | Status: AC
  Administered 2016-03-20: 15 mL via OROMUCOSAL

## 2016-03-20 MED ORDER — SODIUM CHLORIDE 0.9% FLUSH: 3.0000 mL | INTRAVENOUS | Status: DC | PRN

## 2016-03-20 MED ORDER — METOPROLOL TARTRATE 12.5 MG HALF TABLET: 12.5000 mg | ORAL_TABLET | Freq: Two times a day (BID) | ORAL | Status: DC

## 2016-03-20 MED ORDER — METOPROLOL TARTRATE 25 MG/10 ML ORAL SUSPENSION: 12.5000 mg | Freq: Two times a day (BID) | ORAL | Status: DC

## 2016-03-20 MED ORDER — SODIUM CHLORIDE 0.9 % IV SOLN
INTRAVENOUS | Status: DC
  Administered 2016-03-21: 10 mL/h via INTRAVENOUS

## 2016-03-20 MED ORDER — DEXMEDETOMIDINE HCL IN NACL 200 MCG/50ML IV SOLN
0.0000 ug/kg/h | INTRAVENOUS | Status: DC
  Administered 2016-03-20: 0.7 ug/kg/h via INTRAVENOUS
  Filled 2016-03-20 (×2): qty 50

## 2016-03-20 MED ORDER — MIDAZOLAM HCL 10 MG/2ML IJ SOLN
INTRAMUSCULAR | Status: AC
  Filled 2016-03-20: qty 2

## 2016-03-20 MED ORDER — PANTOPRAZOLE SODIUM 40 MG PO TBEC
40.0000 mg | DELAYED_RELEASE_TABLET | Freq: Every day | ORAL | Status: DC
  Administered 2016-03-22 – 2016-03-26 (×5): 40 mg via ORAL
  Filled 2016-03-20 (×5): qty 1

## 2016-03-20 MED ORDER — LACTATED RINGERS IV SOLN
INTRAVENOUS | Status: DC | PRN
  Administered 2016-03-20 (×3): via INTRAVENOUS

## 2016-03-20 MED ORDER — PHENYLEPHRINE HCL 10 MG/ML IJ SOLN
0.0000 ug/min | INTRAVENOUS | Status: DC
  Administered 2016-03-20: 40 ug/min via INTRAVENOUS
  Filled 2016-03-20 (×3): qty 2

## 2016-03-20 MED ORDER — METOPROLOL TARTRATE 5 MG/5ML IV SOLN: 2.5000 mg | INTRAVENOUS | Status: DC | PRN

## 2016-03-20 MED ORDER — ROCURONIUM BROMIDE 10 MG/ML (PF) SYRINGE
PREFILLED_SYRINGE | INTRAVENOUS | Status: DC | PRN
  Administered 2016-03-20 (×2): 60 mg via INTRAVENOUS

## 2016-03-20 MED ORDER — LIDOCAINE 2% (20 MG/ML) 5 ML SYRINGE
INTRAMUSCULAR | Status: DC | PRN
  Administered 2016-03-20: 100 mg via INTRAVENOUS

## 2016-03-20 MED ORDER — LEVOFLOXACIN IN D5W 750 MG/150ML IV SOLN
750.0000 mg | INTRAVENOUS | Status: AC
  Administered 2016-03-21: 750 mg via INTRAVENOUS
  Filled 2016-03-20: qty 150

## 2016-03-20 MED FILL — Potassium Chloride Inj 2 mEq/ML: INTRAVENOUS | Qty: 40 | Status: AC

## 2016-03-20 MED FILL — Mannitol IV Soln 20%: INTRAVENOUS | Qty: 500 | Status: AC

## 2016-03-20 MED FILL — Lidocaine HCl IV Inj 20 MG/ML: INTRAVENOUS | Qty: 5 | Status: AC

## 2016-03-20 MED FILL — Heparin Sodium (Porcine) Inj 1000 Unit/ML: INTRAMUSCULAR | Qty: 30 | Status: AC

## 2016-03-20 MED FILL — Magnesium Sulfate Inj 50%: INTRAMUSCULAR | Qty: 10 | Status: AC

## 2016-03-20 MED FILL — Electrolyte-R (PH 7.4) Solution: INTRAVENOUS | Qty: 3000 | Status: AC

## 2016-03-20 MED FILL — Heparin Sodium (Porcine) Inj 1000 Unit/ML: INTRAMUSCULAR | Qty: 10 | Status: AC

## 2016-03-20 MED FILL — Sodium Bicarbonate IV Soln 8.4%: INTRAVENOUS | Qty: 50 | Status: AC

## 2016-03-20 MED FILL — Sodium Chloride IV Soln 0.9%: INTRAVENOUS | Qty: 2000 | Status: AC

## 2016-03-20 SURGICAL SUPPLY — 150 items
ADAPTER CARDIO PERF ANTE/RETRO (ADAPTER) ×2 IMPLANT
ADH SKN CLS APL DERMABOND .7 (GAUZE/BANDAGES/DRESSINGS) ×2
ADPR PRFSN 84XANTGRD RTRGD (ADAPTER) ×1
APPLICATOR COTTON TIP 6IN STRL (MISCELLANEOUS) IMPLANT
ARTICLIP LAA PROCLIP II 45 (Clip) IMPLANT
BAG DECANTER FOR FLEXI CONT (MISCELLANEOUS) ×2 IMPLANT
BLADE STERNUM SYSTEM 6 (BLADE) ×2 IMPLANT
BLADE SURG 11 STRL SS (BLADE) ×2 IMPLANT
CANISTER SUCTION 2500CC (MISCELLANEOUS) ×4 IMPLANT
CANN PRFSN 3/8X14X24FR PCFC (MISCELLANEOUS)
CANN PRFSN 3/8XCNCT ST RT ANG (MISCELLANEOUS)
CANNULA EZ GLIDE AORTIC 21FR (CANNULA) ×2 IMPLANT
CANNULA FEM VENOUS REMOTE 22FR (CANNULA) ×1 IMPLANT
CANNULA FEMORAL ART 14 SM (MISCELLANEOUS) ×2 IMPLANT
CANNULA GUNDRY RCSP 15FR (MISCELLANEOUS) ×2 IMPLANT
CANNULA OPTISITE PERFUSION 16F (CANNULA) IMPLANT
CANNULA OPTISITE PERFUSION 18F (CANNULA) IMPLANT
CANNULA PRFSN 3/8X14X24FR PCFC (MISCELLANEOUS) IMPLANT
CANNULA PRFSN 3/8XCNCT RT ANG (MISCELLANEOUS) IMPLANT
CANNULA SUMP PERICARDIAL (CANNULA) ×4 IMPLANT
CANNULA VEN MTL TIP RT (MISCELLANEOUS)
CARDIOBLATE CARDIAC ABLATION (MISCELLANEOUS)
CATH FOLEY 2WAY SLVR  5CC 14FR (CATHETERS)
CATH FOLEY 2WAY SLVR 5CC 14FR (CATHETERS) IMPLANT
CATH THORACIC 28FR RT ANG (CATHETERS) IMPLANT
CATH THORACIC 36FR (CATHETERS) IMPLANT
CLAMP OLL ABLATION (MISCELLANEOUS) ×1 IMPLANT
CLIP FOGARTY SPRING 6M (CLIP) IMPLANT
CONN 1/2X1/2X1/2  BEN (MISCELLANEOUS) ×1
CONN 1/2X1/2X1/2 BEN (MISCELLANEOUS) ×1 IMPLANT
CONN 3/8X1/2 ST GISH (MISCELLANEOUS) ×4 IMPLANT
CONN ST 1/4X3/8  BEN (MISCELLANEOUS) ×2
CONN ST 1/4X3/8 BEN (MISCELLANEOUS) ×2 IMPLANT
CONNECTOR 1/2X3/8X1/2 3 WAY (MISCELLANEOUS) ×1
CONNECTOR 1/2X3/8X1/2 3WAY (MISCELLANEOUS) ×1 IMPLANT
COVER BACK TABLE 24X17X13 BIG (DRAPES) ×2 IMPLANT
COVER SURGICAL LIGHT HANDLE (MISCELLANEOUS) ×4 IMPLANT
CRADLE DONUT ADULT HEAD (MISCELLANEOUS) ×2 IMPLANT
DERMABOND ADVANCED (GAUZE/BANDAGES/DRESSINGS) ×2
DERMABOND ADVANCED .7 DNX12 (GAUZE/BANDAGES/DRESSINGS) ×2 IMPLANT
DEVICE ATRICLIP LAA PRCLPII 45 (Clip) IMPLANT
DEVICE CARDIOBLATE CARDIAC ABL (MISCELLANEOUS) IMPLANT
DEVICE TROCAR PUNCTURE CLOSURE (ENDOMECHANICALS) ×2 IMPLANT
DRAIN CHANNEL 28F RND 3/8 FF (WOUND CARE) ×4 IMPLANT
DRAIN CHANNEL 32F RND 10.7 FF (WOUND CARE) IMPLANT
DRAPE BILATERAL SPLIT (DRAPES) ×2 IMPLANT
DRAPE C-ARM 42X72 X-RAY (DRAPES) ×2 IMPLANT
DRAPE CARDIOVASCULAR INCISE (DRAPES)
DRAPE CV SPLIT W-CLR ANES SCRN (DRAPES) ×2 IMPLANT
DRAPE INCISE IOBAN 66X45 STRL (DRAPES) ×4 IMPLANT
DRAPE SLUSH/WARMER DISC (DRAPES) ×2 IMPLANT
DRAPE SRG 135X102X78XABS (DRAPES) IMPLANT
DRSG COVADERM 4X10 (GAUZE/BANDAGES/DRESSINGS) ×1 IMPLANT
DRSG COVADERM 4X14 (GAUZE/BANDAGES/DRESSINGS) ×2 IMPLANT
DRSG COVADERM 4X8 (GAUZE/BANDAGES/DRESSINGS) ×2 IMPLANT
ELECT BLADE 6.5 EXT (BLADE) ×2 IMPLANT
ELECT REM PT RETURN 9FT ADLT (ELECTROSURGICAL) ×4
ELECTRODE REM PT RTRN 9FT ADLT (ELECTROSURGICAL) ×2 IMPLANT
FELT TEFLON 1X6 (MISCELLANEOUS) ×4 IMPLANT
FEMORAL VENOUS CANN RAP (CANNULA) IMPLANT
GAUZE SPONGE 4X4 12PLY STRL (GAUZE/BANDAGES/DRESSINGS) ×4 IMPLANT
GAUZE SPONGE 4X4 16PLY XRAY LF (GAUZE/BANDAGES/DRESSINGS) ×1 IMPLANT
GLOVE BIO SURGEON STRL SZ 6 (GLOVE) ×3 IMPLANT
GLOVE BIO SURGEON STRL SZ 6.5 (GLOVE) ×1 IMPLANT
GLOVE BIO SURGEON STRL SZ7 (GLOVE) ×1 IMPLANT
GLOVE BIO SURGEON STRL SZ7.5 (GLOVE) IMPLANT
GLOVE BIOGEL PI IND STRL 6 (GLOVE) IMPLANT
GLOVE BIOGEL PI IND STRL 7.5 (GLOVE) IMPLANT
GLOVE BIOGEL PI INDICATOR 6 (GLOVE) ×3
GLOVE BIOGEL PI INDICATOR 7.5 (GLOVE) ×1
GLOVE ORTHO TXT STRL SZ7.5 (GLOVE) ×6 IMPLANT
GOWN STRL REUS W/ TWL LRG LVL3 (GOWN DISPOSABLE) ×4 IMPLANT
GOWN STRL REUS W/TWL LRG LVL3 (GOWN DISPOSABLE) ×8
HEMOSTAT POWDER SURGIFOAM 1G (HEMOSTASIS) ×4 IMPLANT
INSERT FOGARTY XLG (MISCELLANEOUS) ×2 IMPLANT
IV NS 1000ML (IV SOLUTION) ×2
IV NS 1000ML BAXH (IV SOLUTION) IMPLANT
IV NS IRRIG 3000ML ARTHROMATIC (IV SOLUTION) ×1 IMPLANT
KIT BASIN OR (CUSTOM PROCEDURE TRAY) ×2 IMPLANT
KIT DILATOR VASC 18G NDL (KITS) ×2 IMPLANT
KIT DRAINAGE VACCUM ASSIST (KITS) ×1 IMPLANT
KIT ROOM TURNOVER OR (KITS) ×2 IMPLANT
KIT SUCTION CATH 14FR (SUCTIONS) ×6 IMPLANT
LEAD PACING MYOCARDI (MISCELLANEOUS) ×2 IMPLANT
LINE VENT (MISCELLANEOUS) ×1 IMPLANT
LOOP VESSEL SUPERMAXI WHITE (MISCELLANEOUS) ×1 IMPLANT
MARKER GRAFT CORONARY BYPASS (MISCELLANEOUS) IMPLANT
NDL AORTIC ROOT 14G 7F (CATHETERS) ×1 IMPLANT
NEEDLE AORTIC ROOT 14G 7F (CATHETERS) ×2 IMPLANT
NS IRRIG 1000ML POUR BTL (IV SOLUTION) ×10 IMPLANT
PACK OPEN HEART (CUSTOM PROCEDURE TRAY) ×2 IMPLANT
PAD ARMBOARD 7.5X6 YLW CONV (MISCELLANEOUS) ×4 IMPLANT
PAD ELECT DEFIB RADIOL ZOLL (MISCELLANEOUS) ×2 IMPLANT
PATCH CORMATRIX 4CMX7CM (Prosthesis & Implant Heart) ×1 IMPLANT
PROBE CRYO2-ABLATION MALLABLE (MISCELLANEOUS) ×1 IMPLANT
RETRACTOR TRL SOFT TISSUE LG (INSTRUMENTS) IMPLANT
RETRACTOR TRM SOFT TISSUE 7.5 (INSTRUMENTS) IMPLANT
SET CANNULATION TOURNIQUET (MISCELLANEOUS) ×2 IMPLANT
SET CARDIOPLEGIA MPS 5001102 (MISCELLANEOUS) ×1 IMPLANT
SET IRRIG TUBING LAPAROSCOPIC (IRRIGATION / IRRIGATOR) ×2 IMPLANT
SOLUTION ANTI FOG 6CC (MISCELLANEOUS) ×2 IMPLANT
SUCKER INTRACARDIAC WEIGHTED (SUCKER) ×2 IMPLANT
SUT BONE WAX W31G (SUTURE) ×2 IMPLANT
SUT E-PACK MINIMALLY INVASIVE (SUTURE) ×2 IMPLANT
SUT ETHIBOND (SUTURE) IMPLANT
SUT ETHIBOND 2 0 SH (SUTURE) ×4 IMPLANT
SUT ETHIBOND 2 0 SH 36X2 (SUTURE) ×2 IMPLANT
SUT ETHIBOND 2 0 V4 (SUTURE) IMPLANT
SUT ETHIBOND 2 0V4 GREEN (SUTURE) IMPLANT
SUT ETHIBOND 2-0 RB-1 WHT (SUTURE) IMPLANT
SUT ETHIBOND 4 0 TF (SUTURE) IMPLANT
SUT ETHIBOND 5 0 C 1 30 (SUTURE) ×2 IMPLANT
SUT ETHIBOND NAB MH 2-0 36IN (SUTURE) IMPLANT
SUT ETHIBOND X763 2 0 SH 1 (SUTURE) ×2 IMPLANT
SUT GORETEX 6.0 TH-9 30 IN (SUTURE) IMPLANT
SUT GORETEX CV 4 TH 22 36 (SUTURE) ×2 IMPLANT
SUT GORETEX CV-5THC-13 36IN (SUTURE) IMPLANT
SUT GORETEX CV4 TH-18 (SUTURE) ×4 IMPLANT
SUT GORETEX TH-18 36 INCH (SUTURE) IMPLANT
SUT MNCRL AB 3-0 PS2 18 (SUTURE) IMPLANT
SUT PDS AB 1 CTX 36 (SUTURE) IMPLANT
SUT PROLENE 3 0 SH 1 (SUTURE) ×2 IMPLANT
SUT PROLENE 3 0 SH1 36 (SUTURE) ×4 IMPLANT
SUT PROLENE 4 0 RB 1 (SUTURE) ×8
SUT PROLENE 4 0 SH DA (SUTURE) ×4 IMPLANT
SUT PROLENE 4-0 RB1 .5 CRCL 36 (SUTURE) ×2 IMPLANT
SUT SILK  1 MH (SUTURE) ×1
SUT SILK 1 MH (SUTURE) IMPLANT
SUT STEEL 6MS V (SUTURE) IMPLANT
SUT STEEL STERNAL CCS#1 18IN (SUTURE) IMPLANT
SUT STEEL SZ 6 DBL 3X14 BALL (SUTURE) IMPLANT
SUT VIC AB 2-0 CTX 27 (SUTURE) ×1 IMPLANT
SUT VIC AB 3-0 SH 8-18 (SUTURE) ×1 IMPLANT
SYRINGE 10CC LL (SYRINGE) ×2 IMPLANT
SYSTEM SAHARA CHEST DRAIN ATS (WOUND CARE) ×4 IMPLANT
TAPE CLOTH SURG 4X10 WHT LF (GAUZE/BANDAGES/DRESSINGS) ×1 IMPLANT
TAPE PAPER 2X10 WHT MICROPORE (GAUZE/BANDAGES/DRESSINGS) ×1 IMPLANT
TOWEL OR 17X24 6PK STRL BLUE (TOWEL DISPOSABLE) ×4 IMPLANT
TOWEL OR 17X26 10 PK STRL BLUE (TOWEL DISPOSABLE) ×4 IMPLANT
TRAY FOLEY IC TEMP SENS 16FR (CATHETERS) ×2 IMPLANT
TROCAR XCEL BLADELESS 5X75MML (TROCAR) ×2 IMPLANT
TROCAR XCEL NON-BLD 11X100MML (ENDOMECHANICALS) ×4 IMPLANT
TUBE SUCT INTRACARD DLP 20F (MISCELLANEOUS) ×2 IMPLANT
TUBING INSUFFLATION 10FT LAP (TUBING) ×2 IMPLANT
TUNNELER SHEATH ON-Q 11GX8 DSP (PAIN MANAGEMENT) IMPLANT
UNDERPAD 30X30 (UNDERPADS AND DIAPERS) ×2 IMPLANT
WATER STERILE IRR 1000ML POUR (IV SOLUTION) ×4 IMPLANT
WIRE .035 3MM-J 145CM (WIRE) ×2 IMPLANT
WIRE J 3MM .035X145CM (WIRE) ×2 IMPLANT
YANKAUER SUCT BULB TIP NO VENT (SUCTIONS) ×1 IMPLANT

## 2016-03-20 NOTE — Brief Op Note (Addendum)
03/20/2016  1:23 PM  PATIENT:  Joseph Garrett  55 y.o. male  PRE-OPERATIVE DIAGNOSIS:  AFIB MR  POST-OPERATIVE DIAGNOSIS:  AFIB MR  PROCEDURE:  Procedure(s):  MINIMALLY INVASIVE MAZE PROCEDURE  -Complete Bi-Atrial Lesion Set with Radiofrequency Ablation and Cryothermy -Oversew of Atrial Appendage  TRANSESOPHAGEAL ECHOCARDIOGRAM (TEE) (N/A)  SURGEON:    Purcell Nails, MD  ASSISTANTS:  Lowella Dandy, PA-C  ANESTHESIA:   Marcene Duos, MD  CROSSCLAMP TIME:   75'  CARDIOPULMONARY BYPASS TIME: 136'  FINDINGS:  Mild global LV systolic dysfunction  Trace mitral regurgitation  Maze Procedure  Surgical Approach: Right mini thoracotomy  Cut-and-sew:  No.  Cryo: Yes  Cryo Lesions (select all that apply):        2   Box Lesion,     4  Posterior Mirtal Annular Line,     6  Mitral Valve Cryo Lesion,     9   Intercaval Line to Tricuspid Annulus ("T" Lesion) and    10  Tricuspid Cryo Lesion, Medial   16  Other - epicardial posterior AV groove and coronary sinus    Radiofrequency:  Yes.  Bipolar: Yes.  RF Lesions (select all that apply):     1. Pulmonary veins (right)  9   Intercaval Line to Tricuspid Annulus ("T" Lesion),    11  Intercaval Line and   15b  RAA Lateral Wall to "T" Lesion     Left Atrial Appendage Treatment:    Yes-  endocardial oversew  COMPLICATIONS: None  BASELINE WEIGHT: 137 kg  PATIENT DISPOSITION:   TO SICU IN STABLE CONDITION  Purcell Nails, MD 03/20/2016 2:33 PM

## 2016-03-20 NOTE — Procedures (Signed)
Extubation Procedure Note  Patient Details:   Name: ALMANZO REDLER DOB: 01/30/1961 MRN: [id number]   Airway Documentation:     Evaluation  O2 sats: stable throughout Complications: No apparent complications Patient did tolerate procedure well. Bilateral Breath Sounds: Clear   Yes  Hussein Macdougal, Thelma Barge 03/20/2016, 10:20 PM

## 2016-03-20 NOTE — Anesthesia Postprocedure Evaluation (Signed)
Anesthesia Post Note  Patient: Joseph Garrett  Procedure(s) Performed: Procedure(s) (LRB): MINIMALLY INVASIVE MAZE PROCEDURE (N/A) TRANSESOPHAGEAL ECHOCARDIOGRAM (TEE) (N/A)  Patient location during evaluation: SICU Anesthesia Type: General Level of consciousness: sedated Pain management: pain level controlled Vital Signs Assessment: post-procedure vital signs reviewed and stable Respiratory status: patient remains intubated per anesthesia plan Cardiovascular status: stable Anesthetic complications: no    Last Vitals:  Vitals:   03/20/16 1745 03/20/16 1800  BP:    Pulse: 80 80  Resp: 13 13  Temp: 36.9 C 36.9 C    Last Pain:  Vitals:   03/20/16 0709  TempSrc: Oral                 Kennieth Rad

## 2016-03-20 NOTE — Progress Notes (Signed)
Patient ID: DELLON REVETTE, male   DOB: [birth year], 55 y.o.   MRN: [id number] EVENING ROUNDS NOTE :     [address]       [place] [address]             [phone]                 Day of Surgery Procedure(s) (LRB): MINIMALLY INVASIVE MAZE PROCEDURE (N/A) TRANSESOPHAGEAL ECHOCARDIOGRAM (TEE) (N/A)  Total Length of Stay:  LOS: 0 days  BP 99/73   Pulse 80   Temp 98.4 F (36.9 C)   Resp 14   SpO2 100%   .Intake/Output      09/20 0701 - 09/21 0700   I.V. 3077.8   Blood 864   IV Piggyback 600   Total Intake 4541.8   Urine 1015   Blood 1900   Chest Tube 210   Total Output 3125   Net +1416.8         . sodium chloride    . sodium chloride 100 mL (03/20/16 1650)  . [START ON 03/21/2016] sodium chloride    . sodium chloride    . dexmedetomidine Stopped (03/20/16 1816)  . insulin (NOVOLIN-R) infusion 1.1 Units/hr (03/20/16 1800)  . lactated ringers    . lactated ringers 20 mL (03/20/16 1655)  . nitroGLYCERIN Stopped (03/20/16 1515)  . phenylephrine (NEO-SYNEPHRINE) Adult infusion 40 mcg/min (03/20/16 1900)     Lab Results  Component Value Date   WBC 16.1 (H) 03/20/2016   HGB 14.2 03/20/2016   HCT 43.4 03/20/2016   PLT 143 (L) 03/20/2016   GLUCOSE 113 (H) 03/20/2016   ALT 40 03/18/2016   AST 28 03/18/2016   NA 144 03/20/2016   K 4.4 03/20/2016   CL 107 03/20/2016   CREATININE 1.00 03/20/2016   BUN 19 03/20/2016   CO2 20 (L) 03/18/2016   TSH 0.282 (L) 12/27/2015   INR 1.36 03/20/2016   Sedated on vent, waking up start weaning vent soon Not bleeding  Delight Ovens MD  Beeper [phone] Office [phone] 03/20/2016 7:12 PM

## 2016-03-20 NOTE — Op Note (Signed)
CARDIOTHORACIC SURGERY OPERATIVE NOTE  Date of Procedure:   03/20/2016  Preoperative Diagnosis:    Recurrent Persistent Atrial Fibrillation  Postoperative Diagnosis: Same  Procedure:    Minimally-Invasive Maze Procedure  Complete bilateral atrial lesion set using cryothermy and bipolar radiofrequency ablation  Endocardial oversewing of left atrial appendage  Surgeon: Salvatore Decent. Cornelius Moras, MD  Assistant: Lowella Dandy, PA-C  Anesthesia: Marcene Duos, MD  Operative Findings:  Mild global LV systolic dysfunction  Trace mitral regurgitation  Adhesions surrounding the left atrial appendage                   BRIEF CLINICAL NOTE AND INDICATIONS FOR SURGERY  Patient is a 55 year old obese white male with long-standing history of atrial fibrillation, chronic combined systolic and diastolic congestive heart failure, hypertension, hypercholesterolemia, and obstructive sleep apnea who has been referred for surgical consultation to discuss treatment options for management of recurrent persistent atrial fibrillation which has failed medical therapy on multiple agents and catheter-based ablation on 2 previous occasions. The patient states that he was initially diagnosed with paroxysmal atrial fibrillation more than 20years ago. Over the years he developed increasing frequency and duration of symptoms of tachycardia palpitations and shortness of breath that occurred while the patient was in atrial fibrillation. He was followed for a period of time by Dr. Shelva Majestic and treated with digoxin and Coumadin. He was treated with flecainide for at least 3 years. Ultimately he was referred to Dr. [name] and underwent catheter-based ablation in September 2013. He initially did well but he developed recurrent persistent atrial fibrillation within 6 months. He underwent repeat catheter-based ablation and March 2014. Unfortunately he again developed early recurrence of atrial fibrillation. He  was started on amiodarone and has undergone DC cardioversion on numerous occasions. He continued to have episodes of recurrent persistent atrial fibrillation associated with symptoms of shortness of breath, dizzy spells, and 2 brief syncopal episodes. He was referred for surgical consultation in November 2015. We discussed the risks and benefits of surgical Maze procedure at that time, but the patient decided to hold off as he was maintaining sinus rhythm on amiodarone at that time. The patient states that several months ago he began to experience worsening symptoms of exertional shortness of breath and fatigue. He was seen in follow-up recently by Dr. [name] and the possibility of reconsidering surgical ablation was discussed. Heunderwent repeat cardioversion on 12/29/2015. The patient initially went back into sinus rhythm but again returned to atrial fibrillation. Since then the patient subsequently underwent TEE and diagnostic cardiac catheterization by Dr. Shirlee Latch on 01/25/2016. Transesophageal echocardiogram revealed stable mild left ventricular systolic dysfunction with ejection fraction estimated 45-50%. There was mild to moderate central mitral regurgitation with mild left atrial enlargement and no thrombus in the left atrial appendage. Diagnostic catheterization revealed mild nonobstructive coronary artery disease with mild left ventricular systolic dysfunction and normal right-sided pressures. The patient was referred for surgical consultation.  The patient has been seen in consultation and counseled at length regarding the indications, risks and potential benefits of surgery.  All questions have been answered, and the patient provides full informed consent for the operation as described.    DETAILS OF THE OPERATIVE PROCEDURE  Preparation:  The patient is brought to the operating room on the above mentioned date and central monitoring was established by the anesthesia team including placement  of Swan-Ganz catheter through the left internal jugular vein.  A radial arterial line is placed. The patient is placed in the supine position on  the operating table.  Intravenous antibiotics are administered. General endotracheal anesthesia is induced uneventfully. The patient is initially intubated using a dual lumen endotracheal tube.  A Foley catheter is placed.  Baseline transesophageal echocardiogram was performed.  Findings were notable for mild global LV systolic dysfunction.  There was trace central mitral regurgitation with normal mitral leaflet mobility.  There was moderate left atrial enlargement.  There was no clot in the left atrial appendage.  A soft roll is placed behind the patient's left scapula and the neck gently extended and turned to the left.   The patient's right neck, chest, abdomen, both groins, and both lower extremities are prepared and draped in a sterile manner. A time out procedure is performed.   Surgical Approach:  A right miniature anterolateral thoracotomy incision is performed. The incision is placed just lateral to and superior to the right nipple. The pectoralis major muscle is retracted medially and completely preserved. The right pleural space is entered through the 3rd intercostal space. A soft tissue retractor is placed.  Two 11 mm ports are placed through separate stab incisions inferiorly. The right pleural space is insufflated continuously with carbon dioxide gas through the posterior port during the remainder of the operation.  A pledgeted sutures placed through the dome of the right hemidiaphragm and retracted inferiorly to facilitate exposure.  A longitudinal incision is made in the pericardium 3 cm anterior to the phrenic nerve and silk traction sutures are placed on either side of the incision for exposure.   Extracorporeal Cardiopulmonary Bypass and Myocardial Protection:  A small incision is made in the right inguinal crease and the anterior surface of  the right common femoral artery and right common femoral vein are identified.  The patient is placed in Trendelenburg position. The right internal jugular vein is cannulated with Seldinger technique and a guidewire advanced into the right atrium. The patient is heparinized systemically. The right internal jugular vein is cannulated with a 14 [place] pediatric femoral venous cannula. Pursestring sutures are placed on the anterior surface of the right common femoral vein and right common femoral artery. The right common femoral vein is cannulated with the Seldinger technique and a guidewire is advanced under transesophageal echocardiogram guidance through the right atrium. The femoral vein is cannulated with a long 22 French femoral venous cannula. The right common femoral artery is cannulated with Seldinger technique and a flexible guidewire is advanced until it can be appreciated intraluminally in the descending thoracic aorta on transesophageal echocardiogram. The femoral artery is cannulated with an 18 French femoral arterial cannula.  Adequate heparinization is verified.      The entire pre-bypass portion of the operation was notable for stable hemodynamics.  Cardiopulmonary bypass was begun.  Vacuum assist venous drainage is utilized. The incision in the pericardium is extended in both directions. Venous drainage and exposure are notably excellent.   An antegrade cardioplegia cannula is placed in the ascending aorta.  The patient is cooled to 28C systemic temperature.  The aortic cross clamp is applied and cold blood cardioplegia is delivered initially in an antegrade fashion through the aortic root.  The initial cardioplegic arrest is rapid with early diastolic arrest.  Repeat doses of cardioplegia are administered intermittently every 20 to 30 minutes throughout the entire cross clamp portion of the operation through the aortic root in order to maintain completely flat electrocardiogram.  Myocardial  protection was felt to be excellent.  An attempt is made to place an epicardial clip (Atricure left atrial  clip) on the left atrial appendage through the transverse sinus.  The as aborted due to the presence of adhesions surrounding the left atrial appendage.   Maze Procedure:  Following placement of the aortic crossclamp and the administration of the initial arresting dose of cardioplegia, a left atriotomy incision was performed through the interatrial groove and extended partially across the back wall of the left atrium after opening the oblique sinus inferiorly.  The mitral valve and floor of the left atrium are exposed using a self-retaining retractor.    The Atricure CryoICE nitrous oxide cryothermy system is utilized for all cryothermy ablation lesions.  The left atrial lesion set of the Cox cryomaze procedure is now performed using 3 minute duration for all cryothermy lesions.  Initially a lesion is placed along the endocardial surface of the left atrium from the caudad apex of the atriotomy incision across the posterior wall of the left atrium onto the posterior mitral annulus.  A mirror image lesion along the epicardial surface is then performed with the probe posterior to the left atrium, crossing over the coronary sinus.  Two lesions are then performed to create a box isolating all of the pulmonary veins from the remainder of the left atrium.  The first lesion is placed from the cephalad apex of the atriotomy incision across the dome of the left atrium to just anterior to the left sided pulmonary veins.  The second lesion completes the box from the caudad apex of the atriotomy incision across the back wall of the left atrium to connect with the previous lesion just anterior to the left sided pulmonary veins.    The AtriCure Synergy bipolar radiofrequency ablation clamp is utilized to create a series of linear lesions in the left atrium.  The right pulmonary veins are encircled using the  atriotomy incision as the anterior half of the ellipse and the remainder performed using the bipolar clamp with one limb endocardial and one limb epicardial.  Linear lesions are placed across the back-wall of the left atrium and across the dome, duplicating the superior rim and inferior rim of the box lesion previously created using cryothermy.  The mitral valve was inspected and notable for normal appearing leaflets and mobility.  The left atrial appendage was oversewn from within the left atrium using a 2-layer closure of running 3-0 Prolene suture.  The atriotomy was closed using a 2-layer closure of running 3-0 Prolene suture after placing a sump drain across the mitral valve to serve as a left ventricular vent.  One final dose of warm retrograde "hot shot" cardioplegia was administered retrograde through the coronary sinus catheter while all air was evacuated through the aortic root.  The aortic cross clamp was removed after a total cross clamp time of 73 minutes.  The retrograde cardioplegia cannula was removed and the small hole in the right atrium extended a short distance.  The AtriCure Synergy bipolar radiofrequency ablation clamp is utilized to create a series of linear lesions in the right atrium, each with one limb of the clamp along the endocardial surface and the other along the epicardial surface. The first lesion is placed from the posterior apex of the atriotomy incision and along the lateral wall of the right atrium to reach the lateral aspect of the superior vena cava. A second lesion is placed in the opposite direction from the posterior apex of the atriotomy incision along the lateral wall to reach the lateral aspect of the inferior vena cava. A third lesion is  placed from the midportion of the atriotomy incision extending at a right angle to reach the tip of the right atrial appendage. A fourth lesion is placed from the anterior apex of the atriotomy incision in an anterior and inferior  direction to reach the acute margin of the heart. Finally, the cryotherapy probe is utilized to complete the right atrial lesion set by placing the probe along the endocardial surface of the right atrium from the anterior apex of the atriotomy incision to reach the tricuspid annulus at the 2:00 position. The atriotomy incision is closed with a 2 layer closure of running 4-0 Prolene suture.   Procedure Completion:  Epicardial pacing wires are fixed to the inferior wall of the right ventricule and to the right atrial appendage. The patient is rewarmed to 37C temperature. The left ventricular vent is removed.  The antegrade cardioplegia cannula is now removed. The patient is weaned and disconnected from cardiopulmonary bypass.  Initially there was some transient ST segment elevation on EKG consistent with intracoronary air.  Cardiopulmonary bypass was re instituted for a few minutes, after which time the patient was weaned and disconnected from cardiopulmonary bypass without difficulty.  The patient's rhythm at separation from bypass was sinus.  The patient was weaned from bypass without any inotropic support. Total cardiopulmonary bypass time for the operation was 136 minutes.  Followup transesophageal echocardiogram performed after separation from bypass revealed left ventricular function was unchanged from preoperatively.  The femoral arterial and venous cannulae were removed uneventfully. There was a palpable pulse in the distal right common femoral artery after removal of the cannula. Protamine was administered to reverse the anticoagulation. The right internal jugular cannula was removed and manual pressure held on the neck for 15 minutes.  Single lung ventilation was begun. The atriotomy closure was inspected for hemostasis. The pericardial sac was drained using a 28 French Bard drain placed through the anterior port incision.  The pericardium was closed using a patch of core matrix bovine submucosal  tissue patch. The right pleural space is irrigated with saline solution and inspected for hemostasis. The right pleural space was drained using a 28 French Bard drain placed through the posterior port incision. The miniature thoracotomy incision was closed in multiple layers in routine fashion. The right groin incision was inspected for hemostasis and closed in multiple layers in routine fashion.  The post-bypass portion of the operation was notable for stable rhythm and hemodynamics.  No blood products were administered during the operation.   Disposition:  The patient tolerated the procedure well.  The patient was reintubated using a single lumen endotracheal tube and subsequently transported to the surgical intensive care unit in stable condition. There were no intraoperative complications. All sponge instrument and needle counts are verified correct at completion of the operation.    Salvatore Decentlarence H. Cornelius Moraswen MD 03/20/2016 2:39 PM

## 2016-03-20 NOTE — Anesthesia Procedure Notes (Signed)
Procedure Name: Intubation Date/Time: 03/20/2016 8:48 AM Performed by: Margaree Mackintosh Pre-anesthesia Checklist: Patient identified, Emergency Drugs available, Suction available, Patient being monitored and Timeout performed Patient Re-evaluated:Patient Re-evaluated prior to inductionOxygen Delivery Method: Circle system utilized Preoxygenation: Pre-oxygenation with 100% oxygen Intubation Type: IV induction Ventilation: Mask ventilation without difficulty and Oral airway inserted - appropriate to patient size Laryngoscope Size: Mac and 4 Grade View: Grade I Endobronchial tube: Double lumen EBT, EBT position confirmed by auscultation and EBT position confirmed by fiberoptic bronchoscope and 41 Fr Number of attempts: 1 Airway Equipment and Method: Stylet Placement Confirmation: ETT inserted through vocal cords under direct vision,  positive ETCO2 and breath sounds checked- equal and bilateral Secured at: 27 cm Tube secured with: Tape Dental Injury: Teeth and Oropharynx as per pre-operative assessment

## 2016-03-20 NOTE — OR Nursing (Signed)
13:55 - 45 minute call to SICU charge nurse, 14:25 - 20 minute call to SICU charge nurse

## 2016-03-20 NOTE — Interval H&P Note (Signed)
History and Physical Interval Note:  03/20/2016 7:25 AM  Joseph Garrett  has presented today for surgery, with the diagnosis of AFIB MR  The various methods of treatment have been discussed with the patient and family. After consideration of risks, benefits and other options for treatment, the patient has consented to  Procedure(s): MINIMALLY INVASIVE MAZE PROCEDURE (N/A) POSSIBLE MITRAL VALVE REPAIR (MVR) (N/A) TRANSESOPHAGEAL ECHOCARDIOGRAM (TEE) (N/A) as a surgical intervention .  The patient's history has been reviewed, patient examined, no change in status, stable for surgery.  I have reviewed the patient's chart and labs.  Questions were answered to the patient's satisfaction.     Purcell Nails

## 2016-03-20 NOTE — Progress Notes (Signed)
  Echocardiogram Echocardiogram Transesophageal has been performed.  Joseph Garrett M 03/20/2016, 11:37 AM 

## 2016-03-20 NOTE — Transfer of Care (Signed)
Immediate Anesthesia Transfer of Care Note  Patient: Joseph Joseph Garrett  Procedure(s) Performed: Procedure(s): MINIMALLY INVASIVE MAZE PROCEDURE (N/A) TRANSESOPHAGEAL ECHOCARDIOGRAM (TEE) (N/A)  Patient Location: 2S09 Surgical ICU  Anesthesia Type:General  Level of Consciousness: sedated  Airway & Oxygen Therapy: Patient remains intubated per anesthesia plan and Patient placed on Ventilator (see vital sign flow sheet for setting)  Post-op Assessment: Report given to RN and Post -op Vital Joseph Garrett reviewed and stable  Post vital Joseph Garrett: Reviewed and stable  Last Vitals:  Vitals:   03/20/16 0709  BP: 107/73  Pulse: 100  Resp: 20  Temp: 36.9 C    Last Pain:  Vitals:   03/20/16 0709  TempSrc: Oral         Complications: No apparent anesthesia complications

## 2016-03-21 ENCOUNTER — Inpatient Hospital Stay (HOSPITAL_COMMUNITY): Payer: BLUE CROSS/BLUE SHIELD

## 2016-03-21 ENCOUNTER — Encounter (HOSPITAL_COMMUNITY): Payer: Self-pay | Admitting: Thoracic Surgery (Cardiothoracic Vascular Surgery)

## 2016-03-21 LAB — CBC
HCT: 40.9 % (ref 39.0–52.0)
HCT: 41.3 % (ref 39.0–52.0)
HEMOGLOBIN: 13.3 g/dL (ref 13.0–17.0)
Hemoglobin: 13.2 g/dL (ref 13.0–17.0)
MCH: 30.6 pg (ref 26.0–34.0)
MCH: 30.4 pg (ref 26.0–34.0)
MCHC: 32.3 g/dL (ref 30.0–36.0)
MCHC: 32.2 g/dL (ref 30.0–36.0)
MCV: 94.9 fL (ref 78.0–100.0)
MCV: 94.5 fL (ref 78.0–100.0)
PLATELETS: 134 10*3/uL — AB (ref 150–400)
PLATELETS: 132 10*3/uL — AB (ref 150–400)
RBC: 4.31 MIL/uL (ref 4.22–5.81)
RBC: 4.37 MIL/uL (ref 4.22–5.81)
RDW: 13.5 % (ref 11.5–15.5)
RDW: 13.7 % (ref 11.5–15.5)
WBC: 14.5 10*3/uL — ABNORMAL HIGH (ref 4.0–10.5)
WBC: 17.3 10*3/uL — ABNORMAL HIGH (ref 4.0–10.5)

## 2016-03-21 LAB — POCT I-STAT 3, ART BLOOD GAS (G3+)
Acid-base deficit: 5 mmol/L — ABNORMAL HIGH (ref 0.0–2.0)
Bicarbonate: 22.4 mmol/L (ref 20.0–28.0)
O2 Saturation: 97 %
PCO2 ART: 48.9 mmHg — AB (ref 32.0–48.0)
PH ART: 7.27 — AB (ref 7.350–7.450)
PO2 ART: 105 mmHg (ref 83.0–108.0)
Patient temperature: 37.3
TCO2: 24 mmol/L (ref 0–100)

## 2016-03-21 LAB — CREATININE, SERUM
CREATININE: 0.98 mg/dL (ref 0.61–1.24)
GFR calc Af Amer: >60 mL/min (ref >60)
GFR calc non Af Amer: >60 mL/min (ref >60)

## 2016-03-21 LAB — GLUCOSE, CAPILLARY
GLUCOSE-CAPILLARY: 102 mg/dL — AB (ref 65–99)
GLUCOSE-CAPILLARY: 112 mg/dL — AB (ref 65–99)
GLUCOSE-CAPILLARY: 117 mg/dL — AB (ref 65–99)
GLUCOSE-CAPILLARY: 88 mg/dL (ref 65–99)
GLUCOSE-CAPILLARY: 99 mg/dL (ref 65–99)
Glucose-Capillary: 110 mg/dL — ABNORMAL HIGH (ref 65–99)
Glucose-Capillary: 114 mg/dL — ABNORMAL HIGH (ref 65–99)
Glucose-Capillary: 116 mg/dL — ABNORMAL HIGH (ref 65–99)
Glucose-Capillary: 116 mg/dL — ABNORMAL HIGH (ref 65–99)

## 2016-03-21 LAB — BASIC METABOLIC PANEL
Anion gap: 5 (ref 5–15)
BUN: 16 mg/dL (ref 6–20)
CO2: 24 mmol/L (ref 22–32)
CREATININE: 0.89 mg/dL (ref 0.61–1.24)
Calcium: 7.7 mg/dL — ABNORMAL LOW (ref 8.9–10.3)
Chloride: 109 mmol/L (ref 101–111)
GFR calc Af Amer: >60 mL/min (ref >60)
Glucose, Bld: 110 mg/dL — ABNORMAL HIGH (ref 65–99)
Potassium: 4.3 mmol/L (ref 3.5–5.1)
SODIUM: 138 mmol/L (ref 135–145)

## 2016-03-21 LAB — POCT I-STAT, CHEM 8
BUN: 17 mg/dL (ref 6–20)
CALCIUM ION: 1.18 mmol/L (ref 1.15–1.40)
CHLORIDE: 101 mmol/L (ref 101–111)
CREATININE: 1 mg/dL (ref 0.61–1.24)
GLUCOSE: 134 mg/dL — AB (ref 65–99)
HCT: 41 % (ref 39.0–52.0)
Hemoglobin: 13.9 g/dL (ref 13.0–17.0)
Potassium: 4.3 mmol/L (ref 3.5–5.1)
SODIUM: 135 mmol/L (ref 135–145)
TCO2: 22 mmol/L (ref 0–100)

## 2016-03-21 LAB — MAGNESIUM
MAGNESIUM: 2.3 mg/dL (ref 1.7–2.4)
Magnesium: 2.1 mg/dL (ref 1.7–2.4)

## 2016-03-21 LAB — HEMOGLOBIN A1C
HEMOGLOBIN A1C: 5.3 % (ref 4.8–5.6)
MEAN PLASMA GLUCOSE: 105 mg/dL

## 2016-03-21 MED ORDER — INSULIN ASPART 100 UNIT/ML ~~LOC~~ SOLN
0.0000 [IU] | SUBCUTANEOUS | Status: DC
  Administered 2016-03-21: 2 [IU] via SUBCUTANEOUS

## 2016-03-21 MED ORDER — ENOXAPARIN SODIUM 40 MG/0.4ML ~~LOC~~ SOLN: 40.0000 mg | SUBCUTANEOUS | Status: DC

## 2016-03-21 MED ORDER — ZOLPIDEM TARTRATE 5 MG PO TABS
5.0000 mg | ORAL_TABLET | Freq: Every evening | ORAL | Status: DC | PRN
  Administered 2016-03-21 – 2016-03-24 (×4): 5 mg via ORAL
  Filled 2016-03-21 (×4): qty 1

## 2016-03-21 MED ORDER — KETOROLAC TROMETHAMINE 15 MG/ML IJ SOLN
15.0000 mg | Freq: Four times a day (QID) | INTRAMUSCULAR | Status: AC
  Administered 2016-03-21 – 2016-03-22 (×5): 15 mg via INTRAVENOUS
  Filled 2016-03-21 (×5): qty 1

## 2016-03-21 MED ORDER — CARVEDILOL 3.125 MG PO TABS
3.1250 mg | ORAL_TABLET | Freq: Two times a day (BID) | ORAL | Status: DC
  Administered 2016-03-22 – 2016-03-26 (×9): 3.125 mg via ORAL
  Filled 2016-03-21 (×9): qty 1

## 2016-03-21 MED ORDER — FUROSEMIDE 10 MG/ML IJ SOLN
20.0000 mg | Freq: Four times a day (QID) | INTRAMUSCULAR | Status: DC
Start: 2016-03-21 — End: 2016-03-22
  Administered 2016-03-21 (×2): 20 mg via INTRAVENOUS
  Filled 2016-03-21 (×3): qty 2

## 2016-03-21 MED ORDER — AMIODARONE HCL 200 MG PO TABS
200.0000 mg | ORAL_TABLET | Freq: Every day | ORAL | Status: DC
  Administered 2016-03-22 – 2016-03-26 (×5): 200 mg via ORAL
  Filled 2016-03-21 (×5): qty 1

## 2016-03-21 MED ORDER — SERTRALINE HCL 50 MG PO TABS
150.0000 mg | ORAL_TABLET | Freq: Every day | ORAL | Status: DC
  Administered 2016-03-21 – 2016-03-26 (×6): 150 mg via ORAL
  Filled 2016-03-21 (×6): qty 1

## 2016-03-21 MED ORDER — SIMVASTATIN 20 MG PO TABS
20.0000 mg | ORAL_TABLET | Freq: Every day | ORAL | Status: DC
  Administered 2016-03-21 – 2016-03-25 (×5): 20 mg via ORAL
  Filled 2016-03-21 (×5): qty 1

## 2016-03-21 MED ORDER — DEXTROSE 5 % IV SOLN
0.0000 ug/min | INTRAVENOUS | Status: DC
  Administered 2016-03-21: 60 ug/min via INTRAVENOUS
  Administered 2016-03-21: 40 ug/min via INTRAVENOUS
  Administered 2016-03-22: 20 ug/min via INTRAVENOUS
  Filled 2016-03-21 (×3): qty 1

## 2016-03-21 NOTE — Care Management Note (Signed)
Case Management Note  Patient Details  Name: Joseph Garrett MRN: [id number] Date of Birth: Sep 07, 1960  Subjective/Objective:        S/p MINIMALLY INVASIVE MAZE PROCEDURE (N/A) TRANSESOPHAGEAL ECHOCARDIOGRAM (TEE) (N/A)            Action/Plan:  PTA independent from home with wife.  CM will continue to monitor for discharge needs   Expected Discharge Date:                  Expected Discharge Plan:  Home/Self Care  In-House Referral:     Discharge planning Services  CM Consult  Post Acute Care Choice:    Choice offered to:     DME Arranged:    DME Agency:     HH Arranged:    HH Agency:     Status of Service:  In process, will continue to follow  If discussed at Long Length of Stay Meetings, dates discussed:    Additional Comments:  Cherylann Parr, RN 03/21/2016, 10:43 AM

## 2016-03-21 NOTE — Progress Notes (Signed)
[facility] BRIEF SICU PROGRESS NOTE  1 Day Post-Op  S/P Procedure(s) (LRB): MINIMALLY INVASIVE MAZE PROCEDURE (N/A) TRANSESOPHAGEAL ECHOCARDIOGRAM (TEE) (N/A)   Feels much better.  Ambulated in SICU Maintaining NSR - AAI paced rhythm Still on Neo but weaning drip Breathing comfortably w/ O2 sats 95% on 2 L/min UOP adequate  Plan: Continue current plan  Purcell Nails, MD 03/21/2016 5:30 PM

## 2016-03-21 NOTE — Progress Notes (Addendum)
[address]       [place] [id number]             [phone]        CARDIOTHORACIC SURGERY PROGRESS NOTE   R1 Day Post-Op Procedure(s) (LRB): MINIMALLY INVASIVE MAZE PROCEDURE (N/A) TRANSESOPHAGEAL ECHOCARDIOGRAM (TEE) (N/A)  Subjective: Looks good.  Feels sore in chest.  No nausea.  Objective: Vital signs: BP Readings from Last 1 Encounters:  03/21/16 99/67   Pulse Readings from Last 1 Encounters:  03/21/16 80   Resp Readings from Last 1 Encounters:  03/21/16 (!) 21   Temp Readings from Last 1 Encounters:  03/21/16 99 F (37.2 C)    Hemodynamics: PAP: (38-55)/(18-31) 47/23 CO:  [4.3 L/min-5.5 L/min] 5.3 L/min CI:  [1.7 L/min/m2-205 L/min/m2] 205 L/min/m2  Physical Exam:  Rhythm:   sinus  Breath sounds: Shallow but clear  Heart sounds:  RRR  Incisions:  Dressing dry, intact  Abdomen:  Soft, non-distended, non-tender  Extremities:  Warm, well-perfused  Chest tubes:  Decreasing volume thin serosanguinous output, no air leak     Intake/Output from previous day: 09/20 0701 - 09/21 0700 In: 6021.4 [I.V.:4357.4; BJYNW:295Blood:864; IV Piggyback:800] Out: 4275 [Urine:1655; Blood:1900; Chest Tube:720] Intake/Output this shift: Total I/O In: 63.8 [I.V.:63.8] Out: 100 [Urine:60; Chest Tube:40]  Lab Results:  CBC: Recent Labs  03/20/16 2040 03/20/16 2042 03/21/16 0432  WBC 13.8*  --  14.5*  HGB 13.8 14.3 13.2  HCT 42.9 42.0 40.9  PLT 149*  --  134*    BMET:  Recent Labs  03/18/16 1233  03/20/16 2042 03/21/16 0432  NA 140  < > 142 138  K 4.5  < > 5.0 4.3  CL 111  < > 106 109  CO2 20*  --   --  24  GLUCOSE 62*  < > 100* 110*  BUN 24*  < > 19 16  CREATININE 1.28*  < > 0.90 0.89  CALCIUM 9.4  --   --  7.7*  < > = values in this interval not displayed.   PT/INR:   Recent Labs  03/20/16 1538  LABPROT 16.9*  INR 1.36    CBG (last 3)   Recent Labs  03/21/16 0305 03/21/16 0357 03/21/16 0817  GLUCAP 102* 99 112*     ABG    Component Value Date/Time   PHART 7.270 (L) 03/21/2016 0404   PCO2ART 48.9 (H) 03/21/2016 0404   PO2ART 105.0 03/21/2016 0404   HCO3 22.4 03/21/2016 0404   TCO2 24 03/21/2016 0404   ACIDBASEDEF 5.0 (H) 03/21/2016 0404   O2SAT 97.0 03/21/2016 0404    CXR: PORTABLE CHEST 1 VIEW  COMPARISON:  03/20/2016  FINDINGS: Since prior exam, the endotracheal tube and nasal/ orogastric tube have been removed.  Inferior hemi thorax chest tube in right hemi thorax chest tube are stable. Left internal jugular Swan-Ganz catheter is stable with its tip projecting towards the right pulmonary artery.  There is lung base opacity that is most likely atelectasis. Lung volumes remain low. No convincing pulmonary edema. No pneumothorax.  No mediastinal widening.  IMPRESSION: 1. Status post extubation since the previous day's study. 2. Low lung volumes with persistent lung base opacity, most likely atelectasis. No pulmonary edema. No pneumothorax. No mediastinal widening. 3. Remaining support apparatus is stable and well positioned.   Electronically Signed   By: Amie Portlandavid  Ormond M.D.   On: 03/21/2016 08:41   EKG: NSR w/out acute ischemic changes  Assessment/Plan: S/P Procedure(s) (LRB): MINIMALLY INVASIVE MAZE PROCEDURE (N/A) TRANSESOPHAGEAL ECHOCARDIOGRAM (TEE) (N/A)  Overall doing fairly well POD1 Maintaining NSR w/ stable hemodynamics, still on Neo drip for BP support Chronic persistent Afib - maintaining NSR s/p maze procedure Breathing comfortably w/ O2 sats 99-100% on 4 L/min Expected post op atelectasis, moderate Morbid obesity w/ OSA Chronic diastolic CHF with expected post-op volume excess, weight reportedly up 15 lbs Hypertension Hyperlipidemia Depression   Mobilize  Hold diuretics until BP improves  Pulm toilet  CPAP   Low dose lovenox to start tomorrow  Will restart Xarelto at hospital discharge   Purcell Nails, MD 03/21/2016 9:03  AM

## 2016-03-22 ENCOUNTER — Inpatient Hospital Stay (HOSPITAL_COMMUNITY): Payer: BLUE CROSS/BLUE SHIELD

## 2016-03-22 LAB — GLUCOSE, CAPILLARY
GLUCOSE-CAPILLARY: 98 mg/dL (ref 65–99)
GLUCOSE-CAPILLARY: 84 mg/dL (ref 65–99)
Glucose-Capillary: 80 mg/dL (ref 65–99)
Glucose-Capillary: 88 mg/dL (ref 65–99)
Glucose-Capillary: 84 mg/dL (ref 65–99)

## 2016-03-22 LAB — BASIC METABOLIC PANEL
Anion gap: 8 (ref 5–15)
BUN: 14 mg/dL (ref 6–20)
CALCIUM: 7.9 mg/dL — AB (ref 8.9–10.3)
CHLORIDE: 101 mmol/L (ref 101–111)
CO2: 26 mmol/L (ref 22–32)
CREATININE: 0.92 mg/dL (ref 0.61–1.24)
GFR calc non Af Amer: >60 mL/min (ref >60)
Glucose, Bld: 98 mg/dL (ref 65–99)
Potassium: 3.8 mmol/L (ref 3.5–5.1)
SODIUM: 135 mmol/L (ref 135–145)

## 2016-03-22 LAB — CBC
HCT: 35.6 % — ABNORMAL LOW (ref 39.0–52.0)
Hemoglobin: 11.4 g/dL — ABNORMAL LOW (ref 13.0–17.0)
MCH: 30.2 pg (ref 26.0–34.0)
MCHC: 32 g/dL (ref 30.0–36.0)
MCV: 94.4 fL (ref 78.0–100.0)
PLATELETS: 99 10*3/uL — AB (ref 150–400)
RBC: 3.77 MIL/uL — AB (ref 4.22–5.81)
RDW: 13.9 % (ref 11.5–15.5)
WBC: 9.3 10*3/uL (ref 4.0–10.5)

## 2016-03-22 MED ORDER — FUROSEMIDE 40 MG PO TABS
40.0000 mg | ORAL_TABLET | Freq: Two times a day (BID) | ORAL | Status: DC
  Administered 2016-03-23 – 2016-03-24 (×3): 40 mg via ORAL
  Filled 2016-03-22 (×4): qty 1

## 2016-03-22 MED ORDER — SODIUM CHLORIDE 0.9% FLUSH: 3.0000 mL | INTRAVENOUS | Status: DC | PRN

## 2016-03-22 MED ORDER — MOVING RIGHT ALONG BOOK
Freq: Once | Status: DC
  Filled 2016-03-22: qty 1

## 2016-03-22 MED ORDER — SODIUM CHLORIDE 0.9% FLUSH
3.0000 mL | Freq: Two times a day (BID) | INTRAVENOUS | Status: DC
  Administered 2016-03-22 – 2016-03-26 (×6): 3 mL via INTRAVENOUS

## 2016-03-22 MED ORDER — POTASSIUM CHLORIDE 10 MEQ/50ML IV SOLN
10.0000 meq | INTRAVENOUS | Status: AC
  Administered 2016-03-22 (×3): 10 meq via INTRAVENOUS
  Filled 2016-03-22 (×3): qty 50

## 2016-03-22 MED ORDER — SODIUM CHLORIDE 0.9 % IV SOLN: 250.0000 mL | INTRAVENOUS | Status: DC | PRN

## 2016-03-22 MED ORDER — INSULIN ASPART 100 UNIT/ML ~~LOC~~ SOLN
0.0000 [IU] | Freq: Three times a day (TID) | SUBCUTANEOUS | Status: DC
  Administered 2016-03-23: 2 [IU] via SUBCUTANEOUS

## 2016-03-22 MED ORDER — FUROSEMIDE 10 MG/ML IJ SOLN
20.0000 mg | Freq: Four times a day (QID) | INTRAMUSCULAR | Status: DC
Start: 2016-03-22 — End: 2016-03-22
  Administered 2016-03-22 (×2): 20 mg via INTRAVENOUS
  Filled 2016-03-22 (×2): qty 2

## 2016-03-22 MED ORDER — POTASSIUM CHLORIDE CRYS ER 20 MEQ PO TBCR
20.0000 meq | EXTENDED_RELEASE_TABLET | Freq: Two times a day (BID) | ORAL | Status: DC
  Administered 2016-03-23 – 2016-03-24 (×3): 20 meq via ORAL
  Filled 2016-03-22 (×3): qty 1

## 2016-03-22 NOTE — Progress Notes (Signed)
Patient ID: Joseph Garrett, male   DOB: 08-17-1960, 55 y.o.   MRN: [id number]   SICU Evening Rounds:   Hemodynamically stable   Urine output good    CBC    Component Value Date/Time   WBC 9.3 03/22/2016 0330   RBC 3.77 (L) 03/22/2016 0330   HGB 11.4 (L) 03/22/2016 0330   HCT 35.6 (L) 03/22/2016 0330   PLT 99 (L) 03/22/2016 0330   MCV 94.4 03/22/2016 0330   MCH 30.2 03/22/2016 0330   MCHC 32.0 03/22/2016 0330   RDW 13.9 03/22/2016 0330   LYMPHSABS 1,128 01/22/2016 1001   MONOABS 564 01/22/2016 1001   EOSABS 282 01/22/2016 1001   BASOSABS 0 01/22/2016 1001     BMET    Component Value Date/Time   NA 135 03/22/2016 0330   K 3.8 03/22/2016 0330   CL 101 03/22/2016 0330   CO2 26 03/22/2016 0330   GLUCOSE 98 03/22/2016 0330   BUN 14 03/22/2016 0330   CREATININE 0.92 03/22/2016 0330   CREATININE 1.16 01/22/2016 1001   CALCIUM 7.9 (L) 03/22/2016 0330   GFRNONAA >60 03/22/2016 0330   GFRAA >60 03/22/2016 0330     A/P:  Stable postop course. Awaiting bed on 2W.

## 2016-03-22 NOTE — Progress Notes (Addendum)
      [address]       [place] [address]             [phone]        CARDIOTHORACIC SURGERY PROGRESS NOTE   R2 Days Post-Op Procedure(s) (LRB): MINIMALLY INVASIVE MAZE PROCEDURE (N/A) TRANSESOPHAGEAL ECHOCARDIOGRAM (TEE) (N/A)  Subjective: No complaints.  Mild soreness right chest.  Slept well.  Hungry for breakfast.  Already ambulated around SICU  Objective: Vital signs: BP Readings from Last 1 Encounters:  03/22/16 (!) 93/52   Pulse Readings from Last 1 Encounters:  03/22/16 93   Resp Readings from Last 1 Encounters:  03/22/16 (!) 23   Temp Readings from Last 1 Encounters:  03/21/16 97.7 F (36.5 C) (Oral)    Hemodynamics: PAP: (47)/(23) 47/23  Physical Exam:  Rhythm:   sinus  Breath sounds: clear  Heart sounds:  RRR  Incisions:  Dressings dry, intact  Abdomen:  Soft, non-distended, non-tender  Extremities:  Warm, well-perfused  Chest tubes:  Decreasing but significant volume thin serosanguinous output, no air leak    Intake/Output from previous day: 09/21 0701 - 09/22 0700 In: 1358.1 [I.V.:1298.1; IV Piggyback:50] Out: 3875 [Urine:2815; Chest Tube:1060] Intake/Output this shift: No intake/output data recorded.  Lab Results:  CBC: Recent Labs  03/21/16 1630 03/22/16 0330  WBC 17.3* 9.3  HGB 13.9  13.3 11.4*  HCT 41.0  41.3 35.6*  PLT 132* 99*    BMET:  Recent Labs  03/21/16 0432 03/21/16 1630 03/22/16 0330  NA 138 135 135  K 4.3 4.3 3.8  CL 109 101 101  CO2 24  --  26  GLUCOSE 110* 134* 98  BUN 16 17 14   CREATININE 0.89 1.00  0.98 0.92  CALCIUM 7.7*  --  7.9*     PT/INR:   Recent Labs  03/20/16 1538  LABPROT 16.9*  INR 1.36    CBG (last 3)   Recent Labs  03/21/16 1750 03/21/16 1923 03/21/16 2339  GLUCAP 116* 114* 88    ABG    Component Value Date/Time   PHART 7.270 (L) 03/21/2016 0404   PCO2ART 48.9 (H) 03/21/2016 0404   PO2ART 105.0 03/21/2016 0404   HCO3 22.4 03/21/2016 0404   TCO2  22 03/21/2016 1630   ACIDBASEDEF 5.0 (H) 03/21/2016 0404   O2SAT 97.0 03/21/2016 0404    CXR: Improved bibasilar atelectasis  Assessment/Plan: S/P Procedure(s) (LRB): MINIMALLY INVASIVE MAZE PROCEDURE (N/A) TRANSESOPHAGEAL ECHOCARDIOGRAM (TEE) (N/A)  Overall doing well POD2 Maintaining NSR w/ stable BP - now off Neo drip Chronic persistent Afib - maintaining NSR s/p maze procedure Breathing comfortably w/ O2 sats 95% on room air Expected post op atelectasis, improved Chronic combined systolic and diastolic CHF with expected post-op volume excess Post op thrombocytopenia, platelet count down 99k Morbid obesity w/ OSA Hypertension Hyperlipidemia Depression   Mobilize  Diuresis  Continue amiodarone and low dose carvedilol  Restart ACE-I in 2-3 days if BP will allow  Pulm toilet  CPAP for sleep  Hold lovenox and watch platelet count  SCD boots for DVT prophylaxis  Will restart Xarelto at hospital discharge  Purcell Nails, MD 03/22/2016 7:48 AM

## 2016-03-23 LAB — GLUCOSE, CAPILLARY
GLUCOSE-CAPILLARY: 87 mg/dL (ref 65–99)
GLUCOSE-CAPILLARY: 104 mg/dL — AB (ref 65–99)
Glucose-Capillary: 126 mg/dL — ABNORMAL HIGH (ref 65–99)
Glucose-Capillary: 83 mg/dL (ref 65–99)

## 2016-03-23 LAB — CBC
HEMATOCRIT: 34.8 % — AB (ref 39.0–52.0)
HEMOGLOBIN: 11 g/dL — AB (ref 13.0–17.0)
MCH: 30.1 pg (ref 26.0–34.0)
MCHC: 31.6 g/dL (ref 30.0–36.0)
MCV: 95.3 fL (ref 78.0–100.0)
Platelets: 106 10*3/uL — ABNORMAL LOW (ref 150–400)
RBC: 3.65 MIL/uL — AB (ref 4.22–5.81)
RDW: 13.8 % (ref 11.5–15.5)
WBC: 8.1 10*3/uL (ref 4.0–10.5)

## 2016-03-23 LAB — BASIC METABOLIC PANEL
ANION GAP: 8 (ref 5–15)
BUN: 18 mg/dL (ref 6–20)
CHLORIDE: 101 mmol/L (ref 101–111)
CO2: 28 mmol/L (ref 22–32)
CREATININE: 1.06 mg/dL (ref 0.61–1.24)
Calcium: 8.4 mg/dL — ABNORMAL LOW (ref 8.9–10.3)
GFR calc non Af Amer: >60 mL/min (ref >60)
Glucose, Bld: 90 mg/dL (ref 65–99)
Potassium: 4.4 mmol/L (ref 3.5–5.1)
SODIUM: 137 mmol/L (ref 135–145)

## 2016-03-23 MED ORDER — INFLUENZA VAC SPLIT QUAD 0.5 ML IM SUSY
0.5000 mL | PREFILLED_SYRINGE | Freq: Once | INTRAMUSCULAR | Status: AC
Start: 2016-03-25 — End: 2016-03-25
  Administered 2016-03-25: 0.5 mL via INTRAMUSCULAR
  Filled 2016-03-23: qty 0.5

## 2016-03-23 NOTE — Progress Notes (Signed)
3 Days Post-Op Procedure(s) (LRB): MINIMALLY INVASIVE MAZE PROCEDURE (N/A) TRANSESOPHAGEAL ECHOCARDIOGRAM (TEE) (N/A) Subjective: Sore but otherwise ok  Objective: Vital signs in last 24 hours: Temp:  [97.8 F (36.6 C)-98.3 F (36.8 C)] 97.8 F (36.6 C) (09/23 0800) Pulse Rate:  [78-161] 78 (09/23 1000) Cardiac Rhythm: Atrial paced;Normal sinus rhythm (09/23 0800) Resp:  [16-24] 18 (09/23 1000) BP: (97-141)/(50-98) 141/98 (09/23 1000) SpO2:  [91 %-100 %] 94 % (09/23 1000) Weight:  [142.6 kg (314 lb 6 oz)] 142.6 kg (314 lb 6 oz) (09/23 0800)  Hemodynamic parameters for last 24 hours:    Intake/Output from previous day: 09/22 0701 - 09/23 0700 In: 340 [P.O.:240; IV Piggyback:100] Out: 2105 [Urine:1525; Chest Tube:580] Intake/Output this shift: Total I/O In: -  Out: 120 [Chest Tube:120]  General appearance: alert and cooperative Neurologic: intact Heart: regular rate and rhythm, S1, S2 normal, no murmur, click, rub or gallop Lungs: clear to auscultation bilaterally Extremities: extremities normal, atraumatic, no cyanosis or edema Wound: dressing dry  Lab Results:  Recent Labs  03/22/16 0330 03/23/16 0320  WBC 9.3 8.1  HGB 11.4* 11.0*  HCT 35.6* 34.8*  PLT 99* 106*   BMET:  Recent Labs  03/22/16 0330 03/23/16 0320  NA 135 137  K 3.8 4.4  CL 101 101  CO2 26 28  GLUCOSE 98 90  BUN 14 18  CREATININE 0.92 1.06  CALCIUM 7.9* 8.4*    PT/INR:  Recent Labs  03/20/16 1538  LABPROT 16.9*  INR 1.36   ABG    Component Value Date/Time   PHART 7.270 (L) 03/21/2016 0404   HCO3 22.4 03/21/2016 0404   TCO2 22 03/21/2016 1630   ACIDBASEDEF 5.0 (H) 03/21/2016 0404   O2SAT 97.0 03/21/2016 0404   CBG (last 3)   Recent Labs  03/22/16 1710 03/22/16 2046 03/23/16 0829  GLUCAP 84 84 87    Assessment/Plan: S/P Procedure(s) (LRB): MINIMALLY INVASIVE MAZE PROCEDURE (N/A) TRANSESOPHAGEAL ECHOCARDIOGRAM (TEE) (N/A)  He is doing well POD 3. Rhythm is sinus  68. Continue amio.  CT output still significant so will keep tubes in for now.  Plan to resume Xarelto at discharge.  Transfer to 2W and continue mobilization.     LOS: 3 days    Alleen Borne 03/23/2016

## 2016-03-23 NOTE — Progress Notes (Signed)
Placed patient on CPAP without complication. RT will continue to monitor as needed. 

## 2016-03-24 ENCOUNTER — Inpatient Hospital Stay (HOSPITAL_COMMUNITY): Payer: BLUE CROSS/BLUE SHIELD

## 2016-03-24 LAB — GLUCOSE, CAPILLARY
GLUCOSE-CAPILLARY: 89 mg/dL (ref 65–99)
GLUCOSE-CAPILLARY: 112 mg/dL — AB (ref 65–99)
Glucose-Capillary: 94 mg/dL (ref 65–99)
Glucose-Capillary: 91 mg/dL (ref 65–99)

## 2016-03-24 NOTE — Progress Notes (Signed)
Patient transported via wheelchair to 2W28. Chest tubes placed back on suction. CPAP machine and SCDs at bedside. Patients wife, Orpha Bur, called per patients request and notified of patients new room assignment.

## 2016-03-24 NOTE — Progress Notes (Signed)
CRITICAL VALUE ALERT  Critical value received:  5% Apical Pneumothorax  Date of notification:  03/24/16  Time of notification:  0845  Critical value read back:Yes.    Nurse who received alert:  Navdeep Halt   Responding MD:  Dr. Laneta Simmers on the floor, was notified in person.

## 2016-03-24 NOTE — Progress Notes (Signed)
4 Days Post-Op Procedure(s) (LRB): MINIMALLY INVASIVE MAZE PROCEDURE (N/A) TRANSESOPHAGEAL ECHOCARDIOGRAM (TEE) (N/A) Subjective: Sore but otherwise feels ok  Objective: Vital signs in last 24 hours: Temp:  [97.3 F (36.3 C)-98 F (36.7 C)] 97.7 F (36.5 C) (09/24 1029) Pulse Rate:  [60-86] 63 (09/24 1029) Cardiac Rhythm: Normal sinus rhythm (09/24 1110) Resp:  [15-22] 18 (09/24 1029) BP: (75-121)/(39-96) 113/60 (09/24 1029) SpO2:  [90 %-98 %] 96 % (09/24 1029) Weight:  [139.4 kg (307 lb 4.8 oz)] 139.4 kg (307 lb 4.8 oz) (09/24 0500)  Hemodynamic parameters for last 24 hours:    Intake/Output from previous day: 09/23 0701 - 09/24 0700 In: 1080 [P.O.:1080] Out: 4905 [Urine:4325; Chest Tube:580] Intake/Output this shift: Total I/O In: 240 [P.O.:240] Out: 890 [Urine:850; Chest Tube:40]  General appearance: alert and cooperative Heart: regular rate and rhythm, S1, S2 normal, no murmur, click, rub or gallop Lungs: clear to auscultation bilaterally Extremities: extremities normal, atraumatic, no cyanosis or edema Wound: chest incision ok Chest tube output chylous  Lab Results:  Recent Labs  03/22/16 0330 03/23/16 0320  WBC 9.3 8.1  HGB 11.4* 11.0*  HCT 35.6* 34.8*  PLT 99* 106*   BMET:  Recent Labs  03/22/16 0330 03/23/16 0320  NA 135 137  K 3.8 4.4  CL 101 101  CO2 26 28  GLUCOSE 98 90  BUN 14 18  CREATININE 0.92 1.06  CALCIUM 7.9* 8.4*    PT/INR: No results for input(s): LABPROT, INR in the last 72 hours. ABG    Component Value Date/Time   PHART 7.270 (L) 03/21/2016 0404   HCO3 22.4 03/21/2016 0404   TCO2 22 03/21/2016 1630   ACIDBASEDEF 5.0 (H) 03/21/2016 0404   O2SAT 97.0 03/21/2016 0404   CBG (last 3)   Recent Labs  03/23/16 2103 03/24/16 0818 03/24/16 1128  GLUCAP 83 89 112*   CLINICAL DATA:  Pt has 2 chest tubes placed in RT lung. Minimallly invasive MAZE procedure no 9/20/1. Hx of CHF, HTN, TIA, non-smoker.  EXAM: PORTABLE CHEST  1 VIEW  COMPARISON:  03/22/2016  FINDINGS: Removal of left IJ Cordis sheath. Midline trachea. Cardiomegaly accentuated by AP portable technique. Moderate right hemidiaphragm elevation. No left and no definite right pleural effusion. There is minimal subcutaneous emphysema about the right chest wall. Right chest tubes remain in place with an approximately 5% right apical pneumothorax identified.  No congestive failure.  Persistent right base atelectasis.  IMPRESSION: Right chest tubes remaining in place with development of an approximately 5% right apical pneumothorax. These results will be called to the ordering clinician or representative by the Radiologist Assistant, and communication documented in the PACS or zVision Dashboard.  Similar right base volume loss with atelectasis.   Electronically Signed   By: Jeronimo Greaves M.D.   On: 03/24/2016 08:46  Assessment/Plan: S/P Procedure(s) (LRB): MINIMALLY INVASIVE MAZE PROCEDURE (N/A) TRANSESOPHAGEAL ECHOCARDIOGRAM (TEE) (N/A)  He is hemodynamically stable in sinus rhythm.   Diuresing well and weight is below preop  Chest tube output was 580 cc yesterday but decreasing since this am. 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 Will keep chest tubes in today.  Continue ambulation and IS  Plan to resume Xarelto at discharge.     LOS: 4 days    Alleen Borne 03/24/2016

## 2016-03-25 ENCOUNTER — Inpatient Hospital Stay (HOSPITAL_COMMUNITY): Payer: BLUE CROSS/BLUE SHIELD

## 2016-03-25 LAB — GLUCOSE, CAPILLARY
GLUCOSE-CAPILLARY: 99 mg/dL (ref 65–99)
GLUCOSE-CAPILLARY: 96 mg/dL (ref 65–99)
Glucose-Capillary: 96 mg/dL (ref 65–99)
Glucose-Capillary: 93 mg/dL (ref 65–99)

## 2016-03-25 MED ORDER — POTASSIUM CHLORIDE CRYS ER 20 MEQ PO TBCR
20.0000 meq | EXTENDED_RELEASE_TABLET | Freq: Every day | ORAL | Status: DC
  Administered 2016-03-26: 20 meq via ORAL
  Filled 2016-03-25: qty 1

## 2016-03-25 MED ORDER — RIVAROXABAN 10 MG PO TABS: 10.0000 mg | ORAL_TABLET | Freq: Every day | ORAL | Status: DC

## 2016-03-25 MED ORDER — RIVAROXABAN 20 MG PO TABS: 20.0000 mg | ORAL_TABLET | Freq: Every day | ORAL | Status: DC

## 2016-03-25 MED ORDER — FUROSEMIDE 10 MG/ML IJ SOLN
40.0000 mg | Freq: Once | INTRAMUSCULAR | Status: DC
  Filled 2016-03-25: qty 4

## 2016-03-25 MED ORDER — FUROSEMIDE 40 MG PO TABS
40.0000 mg | ORAL_TABLET | Freq: Every day | ORAL | Status: DC
  Administered 2016-03-26: 40 mg via ORAL
  Filled 2016-03-25 (×2): qty 1

## 2016-03-25 MED ORDER — ZOLPIDEM TARTRATE 5 MG PO TABS
10.0000 mg | ORAL_TABLET | Freq: Every evening | ORAL | Status: DC | PRN
  Administered 2016-03-25: 10 mg via ORAL
  Filled 2016-03-25: qty 2

## 2016-03-25 MED ORDER — FUROSEMIDE 10 MG/ML IJ SOLN
INTRAMUSCULAR | Status: AC
  Administered 2016-03-25: 11:00:00
  Filled 2016-03-25: qty 4

## 2016-03-25 MED ORDER — POTASSIUM CHLORIDE CRYS ER 20 MEQ PO TBCR
40.0000 meq | EXTENDED_RELEASE_TABLET | Freq: Once | ORAL | Status: AC
  Administered 2016-03-25: 40 meq via ORAL
  Filled 2016-03-25: qty 2

## 2016-03-25 MED ORDER — PHENOL 1.4 % MT LIQD
1.0000 | OROMUCOSAL | Status: DC | PRN
  Administered 2016-03-25: 1 via OROMUCOSAL
  Filled 2016-03-25: qty 177

## 2016-03-25 MED ORDER — ASPIRIN EC 325 MG PO TBEC
325.0000 mg | DELAYED_RELEASE_TABLET | Freq: Every day | ORAL | Status: AC
  Administered 2016-03-25: 325 mg via ORAL
  Filled 2016-03-25: qty 1

## 2016-03-25 NOTE — Discharge Summary (Signed)
Physician Discharge Summary  Patient ID: DIMAGGIO WINEY MRN: [id number] DOB/AGE: 1961-01-05 55 y.o.  Admit date: 03/20/2016 Discharge date: 03/26/2016  Admission Diagnoses: Atrial fibrillation  Discharge Diagnoses:  Principal Problem:   S/P Minimally invasive maze operation for atrial fibrillation Active Problems:   SOB (shortness of breath)   Sleep apnea   Thrombocytopenia (HCC)   OSA (obstructive sleep apnea)   Obesity   Anxiety   Persistent atrial fibrillation (HCC)   Chronic systolic dysfunction of left ventricle   Essential hypertension   Atrial fibrillation, persistent (HCC)   Chronic combined systolic and diastolic CHF (congestive heart failure) (HCC)   Paroxysmal atrial fibrillation (HCC)   Mitral regurgitation - type I dysfunction - mild to moderate   Patient Active Problem List   Diagnosis Date Noted  . S/P Minimally invasive maze operation for atrial fibrillation 03/20/2016  . Mitral regurgitation - type I dysfunction - mild to moderate   . Abnormal PFTs (pulmonary function tests) 04/28/2015  . Paroxysmal atrial fibrillation (HCC)   . Chronic combined systolic and diastolic CHF (congestive heart failure) (HCC)   . Atrial fibrillation, persistent (HCC) 02/22/2014  . Syncope 02/21/2014  . Chronic systolic dysfunction of left ventricle 08/30/2013  . Sinus bradycardia 08/30/2013  . Fatigue 08/30/2013  . Essential hypertension 08/30/2013  . Acute systolic CHF (congestive heart failure) (HCC) 09/10/2012  . Persistent atrial fibrillation (HCC) 03/02/2012  . Hives   . Hypersomnia   . SOB (shortness of breath)   . Sleep apnea   . Hypercholesteremia   . Thrombocytopenia (HCC)   . OSA (obstructive sleep apnea)   . Obesity   . Hyperglycemia   . Anxiety   . Gout     HPI:  Patient is a 55 year old obese white male with long-standing history of atrial fibrillation, chronic combined systolic and diastolic congestive heart failure, hypertension,  hypercholesterolemia, and obstructive sleep apnea who has been referred for surgical consultation to discuss treatment options for management of recurrent persistent atrial fibrillation which has failed medical therapy on multiple agents and catheter-based ablation on 2 previous occasions. The patient states that he was initially diagnosed with paroxysmal atrial fibrillation more than 20years ago. Over the years he developed increasing frequency and duration of symptoms of tachycardia palpitations and shortness of breath that occurred while the patient was in atrial fibrillation. He was followed for a period of time by Dr. Shelva Majestic and treated with digoxin and Coumadin. He was treated with flecainide for at least 3 years. Ultimately he was referred to Dr. [name] and underwent catheter-based ablation in September 2013. He initially did well but he developed recurrent persistent atrial fibrillation within 6 months. He underwent repeat catheter-based ablation and March 2014. Unfortunately he again developed early recurrence of atrial fibrillation. He was started on amiodarone and has undergone DC cardioversion on numerous occasions. He continued to have episodes of recurrent persistent atrial fibrillation associated with symptoms of shortness of breath, dizzy spells, and 2 brief syncopal episodes. He was referred for surgical consultation in November 2015. We discussed the risks and benefits of surgical Maze procedure at that time, but the patient decided to hold off as he was maintaining sinus rhythm on amiodarone at that time. The patient states that several months ago he began to experience worsening symptoms of exertional shortness of breath and fatigue. He was seen in follow-up recently by Dr. [name] and the possibility of reconsidering surgical ablation was discussed. Heunderwent repeat cardioversion on 12/29/2015. The patient initially went back into sinus rhythm  but again returned to atrial fibrillation.  Since then the patient subsequently underwent TEE and diagnostic cardiac catheterization by Dr. Shirlee Latch on 01/25/2016. Transesophageal echocardiogram revealed stable mild left ventricular systolic dysfunction with ejection fraction estimated 45-50%. There was mild to moderate central mitral regurgitation with mild left atrial enlargement and no thrombus in the left atrial appendage. Diagnostic catheterization revealed mild nonobstructive coronary artery disease with mild left ventricular systolic dysfunction and normal right-sided pressures. The patient was referred for surgical consultation.  The patient is married and lives locally in [place] with his wife. They have 3 grown children. He works full-time as a Paramedic. In the past he enjoyed karate but he has not been practicing for quite some time now. The patient complains of a several month history of exertional shortness of breath. He now gets short of breath with mild to moderate level activity such as going up a single flight of stairs. His exertional shortness of breath limits his daily activities to a significant degree. He denies resting shortness of breath, PND, orthopnea. He has never had any chest pain or chest tightness. He has frequent palpitations. He has had occasional dizzy spells and 2 previous syncopal episodes, although last syncopal episode was more than one year ago.  Patient returns to the office today for follow-up of chronic persistent atrial fibrillation, chronic combined systolic and diastolic congestive heart failure and mitral regurgitation with plans to proceed with minimally invasive maze procedure and possible mitral valve repair later this week. He was originally seen in consultation on 02/01/2016. He reports no new problems or complaints. He states that he was having more dizzy spells and exertional fatigue recently, and his dose of carvedilol was adjusted  by Rudi Coco in the atrial fibrillation clinic. He denies any resting shortness of breath, PND, orthopnea, chest pain, or syncope. Appetite is stable. He denies any fevers, chills, or productive cough. He has been having problems sleeping recently.  Discharged Condition: good  Hospital Course: The patient was admitted electively and on 03/20/2016 taken to the operating room where he underwent the below described procedure. He tolerated it well and was taken to the surgical intensive care unit in stable condition  Postoperative hospital course: The patient has remained hemodynamically stable in sinus rhythm. He has remained on amiodarone and low-dose Coreg. He has been given Lovenox for DVT prophylaxis and plan for anticoagulation at time of discharge is Xarelto, he does have a postoperative thrombocytopenia and values are being monitored closely. Most recent platelet count is 106,000. He does have a mild expected acute blood loss anemia which is stable. He has had a moderate amount of chest tube drainage with tube has been removed. He has required some diuresis postoperatively but is responding well. Incisions are noted be healing well without evidence of infection. He is tolerating gradually increasing activities using standard protocols. He is felt to be stable for discharge I.   Consults: None  Significant Diagnostic Studies: routine post-op labs/CXR  Treatments: surgery:  CARDIOTHORACIC SURGERY OPERATIVE NOTE  Date of Procedure:                            03/20/2016  Preoperative Diagnosis:         Recurrent Persistent Atrial Fibrillation  Postoperative Diagnosis:    Same  Procedure:        Minimally-Invasive Maze Procedure  Complete bilateral atrial lesion set using cryothermy and bipolar radiofrequency ablation             Endocardial oversewing of left atrial appendage  Surgeon:        Salvatore Decentlarence H. Cornelius Moraswen, MD  Assistant:       Lowella DandyErin Barrett,  PA-C  Anesthesia:    Marcene Duosobert Fitzgerald, MD  Operative Findings: ? Mild global LV systolic dysfunction ? Trace mitral regurgitation ? Adhesions surrounding the left atrial appendage  Discharge Exam: Blood pressure (!) 102/48, pulse 73, temperature 98.2 F (36.8 C), temperature source Oral, resp. rate 18, height 6\' 2"  (1.88 m), weight (!) 301 lb 14.4 oz (136.9 kg), SpO2 94 %.  General appearance: alert, cooperative and no distress Heart: regular rate and rhythm Lungs: dim right base Abdomen: benign Extremities: + LE edema Wound: incis healing well  Disposition: 01-Home or Self Care     Medication List    STOP taking these medications   lisinopril 20 MG tablet Commonly known as:  PRINIVIL,ZESTRIL     TAKE these medications   amiodarone 200 MG tablet Commonly known as:  PACERONE TAKE 1 TABLET DAILY   carvedilol 3.125 MG tablet Commonly known as:  COREG Take 3.125 mg by mouth 2 (two) times daily with a meal.   furosemide 40 MG tablet Commonly known as:  LASIX Take 1 tablet (40 mg total) by mouth daily.   oxyCODONE 5 MG immediate release tablet Commonly known as:  Oxy IR/ROXICODONE Take 1-2 tablets (5-10 mg total) by mouth every 6 (six) hours as needed for severe pain. What changed:  medication strength  how much to take  when to take this  reasons to take this   oxymetazoline 0.05 % nasal spray Commonly known as:  AFRIN Place 2 sprays into both nostrils 2 (two) times daily as needed for congestion.   potassium chloride SA 20 MEQ tablet Commonly known as:  K-DUR,KLOR-CON Take 1 tablet (20 mEq total) by mouth daily.   rivaroxaban 20 MG Tabs tablet Commonly known as:  XARELTO Take 1 tablet (20 mg total) by mouth daily with supper.   sertraline 100 MG tablet Commonly known as:  ZOLOFT Take 150 mg by mouth every morning.   simvastatin 20 MG tablet Commonly known as:  ZOCOR TAKE 1 TABLET DAILY   zolpidem 5 MG tablet Commonly known as:   AMBIEN Take 1 tablet (5 mg total) by mouth at bedtime as needed for sleep.      Follow-up Information    Purcell Nailslarence H Owen, MD .   Specialty:  Cardiothoracic Surgery Contact information: [address] [place] [address] [phone]        Hillis RangeJames Allred, MD .   Specialty:  Cardiology Contact information: [address] [place] [address] [phone]        Hillis RangeJames Allred, MD .   Specialty:  Cardiology Contact information: [address] [place] [address] [phone]           Signed: Rowe ClackGOLD,Shameca Landen E 03/26/2016, 7:52 AM

## 2016-03-25 NOTE — Discharge Instructions (Signed)
Atrial Fibrillation Atrial fibrillation is a type of heartbeat that is irregular or fast (rapid). If you have this condition, your heart keeps quivering in a weird (chaotic) way. This condition can make it so your heart cannot pump blood normally. Having this condition gives a person more risk for stroke, heart failure, and other heart problems. There are different types of atrial fibrillation. Talk with your doctor to learn about the type that you have. HOME CARE  Take over-the-counter and prescription medicines only as told by your doctor.  If your doctor prescribed a blood-thinning medicine, take it exactly as told. Taking too much of it can cause bleeding. If you do not take enough of it, you will not have the protection that you need against stroke and other problems.  Do not use any tobacco products. These include cigarettes, chewing tobacco, and e-cigarettes. If you need help quitting, ask your doctor.  If you have apnea (obstructive sleep apnea), manage it as told by your doctor.  Do not drink alcohol.  Do not drink beverages that have caffeine. These include coffee, soda, and tea.  Maintain a healthy weight. Do not use diet pills unless your doctor says they are safe for you. Diet pills may make heart problems worse.  Follow diet instructions as told by your doctor.  Exercise regularly as told by your doctor.  Keep all follow-up visits as told by your doctor. This is important. GET HELP IF:  You notice a change in the speed, rhythm, or strength of your heartbeat.  You are taking a blood-thinning medicine and you notice more bruising.  You get tired more easily when you move or exercise. GET HELP RIGHT AWAY IF:  You have pain in your chest or your belly (abdomen).  You have sweating or weakness.  You feel sick to your stomach (nauseous).  You notice blood in your throw up (vomit), poop (stool), or pee (urine).  You are short of breath.  You suddenly have swollen feet  and ankles.  You feel dizzy.  Your suddenly get weak or numb in your face, arms, or legs, especially if it happens on one side of your body.  You have trouble talking, trouble understanding, or both.  Your face or your eyelid droops on one side. These symptoms may be an emergency. Do not wait to see if the symptoms will go away. Get medical help right away. Call your local emergency services (911 in the [place] Do not drive yourself to the hospital.   This information is not intended to replace advice given to you by your health care provider. Make sure you discuss any questions you have with your health care provider.   Document Released: 03/26/2008 Document Revised: 03/08/2015 Document Reviewed: 10/12/2014 Elsevier Interactive Patient Education 2016 Elsevier Inc.  Thoracotomy, Care After  These instructions give you information on caring for yourself after your procedure. Your doctor may also give you more specific instructions. Call your doctor if you have any problems or questions after your procedure. HOME CARE  Only take medicine as told by your doctor. Take pain medicine before your pain gets very bad.  Take deep breaths to protect yourself from a lung infection (pneumonia).  Cough often to clear thick spit (mucus) and to open your lungs. Hold a pillow against your chest if it hurts to cough.  Use your breathing machine (incentive spirometer) as told.  Change bandages (dressings) as told by your doctor.  Take off the bandage over your chest tube area  as told by your doctor.  Continue your normal diet as told by your doctor.  Eat high-fiber foods. This includes whole grain cereals, brown rice, beans, and fresh fruits and vegetables.  Drink enough fluids to keep your pee (urine) clear or pale yellow. Avoid caffeine.  Talk to your doctor about taking a medicine to soften your poop (stool softener, laxative).  Keep doing activities as told by your doctor. Balance your  activity with rest.  Avoid lifting until your doctor says it is okay.  Do not drive until told by your doctor. Do not drive while taking pain medicines (narcotics).  Do not bathe, swim, or use a hot tub until told by your doctor. You may shower. Gently wash the area of your cut (incision) with soap and water as told.  Do not use any tobacco products including cigarettes, chewing tobacco, and electronic cigarettes. Avoid being around others who smoke.  Schedule a doctor visit to get your stitches or staples removed as told.  Schedule and go to all doctor visits as told.  Go to therapy that can help improve your lungs (pulmonary rehabilitation) as told by your doctor.  Do not travel by airplane for 2 weeks after the chest tube is taken out. GET HELP IF:  You are bleeding from your wounds.  You have redness, puffiness (swelling), or more pain in the wounds.  You feel your heart beating fast or skipping beats (irregular heartbeat).  You have yellowish-white fluid (pus) coming from your wounds.  You have a bad smell coming from the wound or bandage.  You have a fever or chills.  You feel sick to your stomach or you throw up (vomit).  You have muscle aches. GET HELP RIGHT AWAY IF:  You have a rash.  You have trouble breathing.  Your medicines are causing you problems.  You keep feeling sick to your stomach (nauseous).  You feel light-headed.  You have shortness of breath or chest pain.  You have pain that will not go away. MAKE SURE YOU:  Understand these instructions.  Will watch your condition.  Will get help right away if you are not doing well or get worse.   This information is not intended to replace advice given to you by your health care provider. Make sure you discuss any questions you have with your health care provider.   Document Released: 12/17/2011 Document Revised: 07/08/2014 Document Reviewed: 02/03/2013 Elsevier Interactive Patient Education  [employer] Elsevier Inc.

## 2016-03-25 NOTE — Progress Notes (Signed)
CARDIAC REHAB PHASE I   PRE:  Rate/Rhythm: 71 SR  BP:  Sitting: 99/58        SaO2: 95 RA  MODE:  Ambulation: 720 ft   POST:  Rate/Rhythm: 77 SR  BP:  Sitting: 118/77         SaO2: 94 RA  Pt ambulated 720 ft on RA, rolling walker, assist x1, fairly steady gait, tolerated fairly well. Pt with some moderate DOE, increased fatigue with distance, however, pt repeatedly declined rest stop. Encouraged energy conservation, IS, additional ambulation x1 today. Pt to recliner after walk, feet elevated, call bell within reach. Will follow.  [id number] Joylene Grapes, RN, BSN 03/25/2016 2:03 PM

## 2016-03-25 NOTE — Progress Notes (Addendum)
      [address]       [place] [address]             [phone]      5 Days Post-Op Procedure(s) (LRB): MINIMALLY INVASIVE MAZE PROCEDURE (N/A) TRANSESOPHAGEAL ECHOCARDIOGRAM (TEE) (N/A)   Subjective:  No new complaints.  Continued soreness.  + ambulation  + BM  Objective: Vital signs in last 24 hours: Temp:  [97.9 F (36.6 C)-98.2 F (36.8 C)] 98.2 F (36.8 C) (09/25 0500) Pulse Rate:  [63-78] 67 (09/25 0500) Cardiac Rhythm: Normal sinus rhythm (09/25 0700) Resp:  [18] 18 (09/25 0500) BP: (82-130)/(42-69) 119/69 (09/25 1045) SpO2:  [94 %-100 %] 100 % (09/25 1045) Weight:  [305 lb 11.2 oz (138.7 kg)] 305 lb 11.2 oz (138.7 kg) (09/25 0500)  Intake/Output from previous day: 09/24 0701 - 09/25 0700 In: 240 [P.O.:240] Out: 2700 [Urine:2550; Chest Tube:150]  General appearance: alert, cooperative and no distress Heart: regular rate and rhythm Lungs: clear to auscultation bilaterally Abdomen: soft, non-tender; bowel sounds normal; no masses,  no organomegaly Extremities: extremities normal, atraumatic, no cyanosis or edema Wound: clean and dry  Lab Results:  Recent Labs  03/23/16 0320  WBC 8.1  HGB 11.0*  HCT 34.8*  PLT 106*   BMET:  Recent Labs  03/23/16 0320  NA 137  K 4.4  CL 101  CO2 28  GLUCOSE 90  BUN 18  CREATININE 1.06  CALCIUM 8.4*    PT/INR: No results for input(s): LABPROT, INR in the last 72 hours. ABG    Component Value Date/Time   PHART 7.270 (L) 03/21/2016 0404   HCO3 22.4 03/21/2016 0404   TCO2 22 03/21/2016 1630   ACIDBASEDEF 5.0 (H) 03/21/2016 0404   O2SAT 97.0 03/21/2016 0404   CBG (last 3)   Recent Labs  03/24/16 2154 03/25/16 0631 03/25/16 1135  GLUCAP 91 99 96    Assessment/Plan: S/P Procedure(s) (LRB): MINIMALLY INVASIVE MAZE PROCEDURE (N/A) TRANSESOPHAGEAL ECHOCARDIOGRAM (TEE) (N/A)  1. CV- Hemodynamically stable in NSR, BP is controlled- continue Amiodarone, Coreg 2. Pulm- off oxygen,  chest tube output 150 cc- will remove today, continue IS 3. Renal- Weight is below baseline, continue Lasix 4. Dispo- patient stable, will d/c chest tubes and EPW today, plan to start Xarelto tomorrow, plan for d/c in AM if remains stable   LOS: 5 days    BARRETT, Joseph Garrett 03/25/2016   I have seen and examined the patient and agree with the assessment and plan as outlined.  Purcell Nails, MD

## 2016-03-26 ENCOUNTER — Inpatient Hospital Stay (HOSPITAL_COMMUNITY): Payer: BLUE CROSS/BLUE SHIELD

## 2016-03-26 LAB — CBC
HCT: 33.5 % — ABNORMAL LOW (ref 39.0–52.0)
Hemoglobin: 10.8 g/dL — ABNORMAL LOW (ref 13.0–17.0)
MCH: 30.2 pg (ref 26.0–34.0)
MCHC: 32.2 g/dL (ref 30.0–36.0)
MCV: 93.6 fL (ref 78.0–100.0)
PLATELETS: 158 10*3/uL (ref 150–400)
RBC: 3.58 MIL/uL — ABNORMAL LOW (ref 4.22–5.81)
RDW: 13.4 % (ref 11.5–15.5)
WBC: 5.6 10*3/uL (ref 4.0–10.5)

## 2016-03-26 LAB — BASIC METABOLIC PANEL
Anion gap: 5 (ref 5–15)
BUN: 14 mg/dL (ref 6–20)
CALCIUM: 8.8 mg/dL — AB (ref 8.9–10.3)
CO2: 34 mmol/L — AB (ref 22–32)
CREATININE: 1.01 mg/dL (ref 0.61–1.24)
Chloride: 98 mmol/L — ABNORMAL LOW (ref 101–111)
GFR calc Af Amer: >60 mL/min (ref >60)
GLUCOSE: 90 mg/dL (ref 65–99)
Potassium: 4.3 mmol/L (ref 3.5–5.1)
Sodium: 137 mmol/L (ref 135–145)

## 2016-03-26 LAB — GLUCOSE, CAPILLARY: GLUCOSE-CAPILLARY: 94 mg/dL (ref 65–99)

## 2016-03-26 MED ORDER — POTASSIUM CHLORIDE CRYS ER 20 MEQ PO TBCR: 20.0000 meq | EXTENDED_RELEASE_TABLET | Freq: Every day | ORAL | 0 refills | Status: DC

## 2016-03-26 MED ORDER — FUROSEMIDE 40 MG PO TABS: 40.0000 mg | ORAL_TABLET | Freq: Every day | ORAL | 0 refills | Status: DC

## 2016-03-26 MED ORDER — RIVAROXABAN 20 MG PO TABS: 20.0000 mg | ORAL_TABLET | Freq: Every day | ORAL | 1 refills | Status: DC

## 2016-03-26 MED ORDER — OXYCODONE HCL 5 MG PO TABS: 5.0000 mg | ORAL_TABLET | Freq: Four times a day (QID) | ORAL | 0 refills | Status: DC | PRN

## 2016-03-26 NOTE — Progress Notes (Signed)
CARDIAC REHAB PHASE I   PRE:  Rate/Rhythm: 78 SR  BP:  Sitting: 112/60        SaO2: 97 RA  MODE:  Ambulation: 720 ft   POST:  Rate/Rhythm: 80 SR  BP:  Sitting: 121/76         SaO2: 98 RA  Pt ambulated 720 ft on RA, hand held assist, fairly steady gait, tolerated well with no complaints other than mild DOE, states this is much improved since his procedure. Cardiac surgery post-op education completed. Reviewed IS, activity progression, exercise guidelines, sodium restrictions, daily weights, s/s/ heart failure, heart healthy diet, and phase 2 cardiac rehab. Pt verbalized understanding, pt declines phase 2 cardiac rehab at this time. Pt to recliner after walk, feet elevated, call bell within reach.   [id number] Joylene Grapes, RN, BSN 03/26/2016 8:56 AM

## 2016-03-26 NOTE — Progress Notes (Addendum)
[address]       [place] [address]             [phone]      6 Days Post-Op Procedure(s) (LRB): MINIMALLY INVASIVE MAZE PROCEDURE (N/A) TRANSESOPHAGEAL ECHOCARDIOGRAM (TEE) (N/A) Subjective: Some incis soreness  Objective: Vital signs in last 24 hours: Temp:  [97.9 F (36.6 C)-98.2 F (36.8 C)] 98.2 F (36.8 C) (09/26 0641) Pulse Rate:  [65-73] 73 (09/26 0641) Cardiac Rhythm: Normal sinus rhythm (09/25 1934) Resp:  [18] 18 (09/26 0641) BP: (82-130)/(42-69) 102/48 (09/26 0641) SpO2:  [94 %-100 %] 94 % (09/26 0641) Weight:  [301 lb 14.4 oz (136.9 kg)] 301 lb 14.4 oz (136.9 kg) (09/26 0641)  Hemodynamic parameters for last 24 hours:    Intake/Output from previous day: 09/25 0701 - 09/26 0700 In: 136.1 [I.V.:136.1] Out: 2550 [Urine:2550] Intake/Output this shift: No intake/output data recorded.  General appearance: alert, cooperative and no distress Heart: regular rate and rhythm Lungs: dim right base Abdomen: benign Extremities: + LE edema Wound: incis healing well  Lab Results:  Recent Labs  03/26/16 0225  WBC 5.6  HGB 10.8*  HCT 33.5*  PLT 158   BMET:  Recent Labs  03/26/16 0225  NA 137  K 4.3  CL 98*  CO2 34*  GLUCOSE 90  BUN 14  CREATININE 1.01  CALCIUM 8.8*    PT/INR: No results for input(s): LABPROT, INR in the last 72 hours. ABG    Component Value Date/Time   PHART 7.270 (L) 03/21/2016 0404   HCO3 22.4 03/21/2016 0404   TCO2 22 03/21/2016 1630   ACIDBASEDEF 5.0 (H) 03/21/2016 0404   O2SAT 97.0 03/21/2016 0404   CBG (last 3)   Recent Labs  03/25/16 1639 03/25/16 2148 03/26/16 0639  GLUCAP 93 96 94    Meds Scheduled Meds: . amiodarone  200 mg Oral Daily  . bisacodyl  10 mg Oral Daily   Or  . bisacodyl  10 mg Rectal Daily  . carvedilol  3.125 mg Oral BID WC  . docusate sodium  200 mg Oral Daily  . furosemide  40 mg Intravenous Once  . furosemide  40 mg Oral Daily  . moving right along book    Does not apply Once  . pantoprazole  40 mg Oral Daily  . potassium chloride  20 mEq Oral Daily  . rivaroxaban  20 mg Oral Q supper  . sertraline  150 mg Oral Daily  . simvastatin  20 mg Oral q1800  . sodium chloride flush  3 mL Intravenous Q12H  . sodium chloride flush  3 mL Intravenous Q12H   Continuous Infusions:  PRN Meds:.sodium chloride, metoprolol, ondansetron (ZOFRAN) IV, oxyCODONE, phenol, sodium chloride flush, sodium chloride flush, traMADol, zolpidem  Xrays Dg Chest 2 View  Result Date: 03/25/2016 CLINICAL DATA:  Status post chest tube placement. EXAM: CHEST  2 VIEW COMPARISON:  Radiograph of March 24, 2016. FINDINGS: Stable cardiomediastinal silhouette. Stable mild right apical pneumothorax is noted compared to prior exam. Two right-sided chest tubes are again noted and unchanged in position. Minimal left basilar subsegmental atelectasis is noted. Stable right lower lobe opacity is noted concerning for atelectasis with probable associated pleural effusion. Bony thorax is unremarkable. IMPRESSION: Stable position of 2 right-sided chest tubes with stable mild right apical pneumothorax. Electronically Signed   By: Lupita Raider, M.D.   On: 03/25/2016 07:52    Assessment/Plan: S/P Procedure(s) (LRB): MINIMALLY INVASIVE MAZE PROCEDURE (N/A)  TRANSESOPHAGEAL ECHOCARDIOGRAM (TEE) (N/A)  1 doing well, stable for discharge' 2 lasix short term 3 BP too low for ACE-I currently  LOS: 6 days    Garrett,Joseph E 03/26/2016   I have seen and examined the patient and agree with the assessment and plan as outlined.  D/C home today.  D/C instructions given.  Joseph Nailslarence H Clydia Nieves, MD 03/26/2016 8:09 AM

## 2016-03-27 ENCOUNTER — Other Ambulatory Visit: Payer: Self-pay | Admitting: *Deleted

## 2016-03-27 DIAGNOSIS — G4700 Insomnia, unspecified: Secondary | ICD-10-CM

## 2016-03-27 MED FILL — Heparin Sodium (Porcine) Inj 1000 Unit/ML: INTRAMUSCULAR | Qty: 2500 | Status: AC

## 2016-03-29 ENCOUNTER — Ambulatory Visit (INDEPENDENT_AMBULATORY_CARE_PROVIDER_SITE_OTHER): Payer: Self-pay | Admitting: Physician Assistant

## 2016-03-29 ENCOUNTER — Other Ambulatory Visit: Payer: Self-pay | Admitting: *Deleted

## 2016-03-29 ENCOUNTER — Ambulatory Visit
Admission: RE | Admit: 2016-03-29 | Discharge: 2016-03-29 | Disposition: A | Discharge status: Home / Self Care | Payer: BLUE CROSS/BLUE SHIELD | Source: Ambulatory Visit | Attending: Thoracic Surgery (Cardiothoracic Vascular Surgery) | Admitting: Thoracic Surgery (Cardiothoracic Vascular Surgery)

## 2016-03-29 VITALS — BP 93/62 | HR 65 | Resp 16 | Ht 74.0 in | Wt 294.8 lb

## 2016-03-29 DIAGNOSIS — Z9889 Other specified postprocedural states: Secondary | ICD-10-CM

## 2016-03-29 DIAGNOSIS — Z09 Encounter for follow-up examination after completed treatment for conditions other than malignant neoplasm: Secondary | ICD-10-CM

## 2016-03-29 DIAGNOSIS — Z8679 Personal history of other diseases of the circulatory system: Secondary | ICD-10-CM

## 2016-03-29 DIAGNOSIS — I481 Persistent atrial fibrillation: Secondary | ICD-10-CM

## 2016-03-29 DIAGNOSIS — I4819 Other persistent atrial fibrillation: Secondary | ICD-10-CM

## 2016-03-29 NOTE — Progress Notes (Signed)
Joseph GrimesKenneth J Garrett is a 55 y.o. male patient.  1. Atrial fibrillation, persistent (HCC)   2. S/P Maze operation for atrial fibrillation    Past Medical History:  Diagnosis Date  . CHF (congestive heart failure) (HCC)   . Chronic combined systolic and diastolic CHF (congestive heart failure) (HCC)   . Cirrhosis (HCC)    liver scarring on biopsy, presumed to be due to methotrexate  . Depression   . DJD (degenerative joint disease)   . Essential hypertension 08/30/2013  . GERD (gastroesophageal reflux disease)    not present  . Gout   . Hives   . Hypercholesteremia   . Hypertension   . Mitral regurgitation - type I dysfunction - mild to moderate   . Obesity   . OSA (obstructive sleep apnea)    mild, uses CPAP  . Paroxysmal atrial fibrillation (HCC)   . Persistent atrial fibrillation (HCC) 03/02/2012  . Psoriasis   . S/P Minimally invasive maze operation for atrial fibrillation 03/20/2016   Complete bilateral atrial lesion set using cryothermy and bipolar radiofrequency ablation via right mini thoracotomy approach with oversewing of LA appendage  . Shortness of breath dyspnea    exertion  . Syncope 02/21/2014  . Thrombocytopenia (HCC)   . TIA (transient ischemic attack)    No past surgical history pertinent negatives on file. Scheduled Meds: Continuous Infusions: PRN Meds:  Allergies  Allergen Reactions  . Penicillins Rash    Has patient had a PCN reaction causing immediate rash, facial/tongue/throat swelling, SOB or lightheadedness with hypotension: Yes Has patient had a PCN reaction causing severe rash involving mucus membranes or skin necrosis: No Has patient had a PCN reaction that required hospitalization No Has patient had a PCN reaction occurring within the last 10 years: No If all of the above answers are "NO", then may proceed with Cephalosporin use.    Active Problems:   * No active hospital problems. *  Blood pressure 93/62, pulse 65, resp. rate 16, height 6\' 2"   (1.88 m), weight 294 lb 12.8 oz (133.7 kg), SpO2 97 %.  Subjective  He is having some drainage out of his chest tube incision  Objective  General: alert, oriented, no distress Cor: RRR no murmur Pulm: diminished breath sounds in bilaterally lower lobes. CTA  Abd: soft, non-tender, + bowel sounds Extremity: no edema, good distal pulses Incision: lateral chest tube incision with clear drainage, tracked 0.5 cm into the skin. Small amount of erythema around incision. Mini thoracotomy c/d/i medial chest tube site is c/d/i without drainage.   Assessment & Plan  Joseph Garrett presented today with a few days of chest tube incisional drainage. He has been changing his dressing BID and today he had about a tablespoon of serous, clear drainage on the dressing. When expressed only a drop of drainage was expelled. He has never had white thick drainage or bright red blood. Initially he described the drainage as "blood tinged". He has no other signs of infection. His CXR showed bilateral atelectasis R > L. No pleural effusion. He is on daily Lasix. He is encouraged to use his incentive spirometer. He is instructed to avoid antibiotic creams and ointments directly on the incisions. He is instructed to wash the incisions with chlorahexadine BID and dress with a dry, sterile dressing. He is asked to call if he experiences copious amounts of drainage or if the drainage becomes white and thick. He has an appointment with Dr. Cornelius Moraswen on 04/08/16, but can always come back before  for an incision check if he chooses. All questions were answered to the patients satisfaction.   Sharlene Dory 03/29/2016

## 2016-04-03 ENCOUNTER — Other Ambulatory Visit: Payer: Self-pay | Admitting: Surgical

## 2016-04-03 ENCOUNTER — Other Ambulatory Visit: Payer: Self-pay | Admitting: *Deleted

## 2016-04-03 DIAGNOSIS — Z79899 Other long term (current) drug therapy: Secondary | ICD-10-CM

## 2016-04-03 MED ORDER — POTASSIUM CHLORIDE CRYS ER 20 MEQ PO TBCR: 20.0000 meq | EXTENDED_RELEASE_TABLET | Freq: Every day | ORAL | 0 refills | Status: DC

## 2016-04-05 ENCOUNTER — Other Ambulatory Visit: Payer: Self-pay | Admitting: Thoracic Surgery (Cardiothoracic Vascular Surgery)

## 2016-04-05 DIAGNOSIS — Z9889 Other specified postprocedural states: Principal | ICD-10-CM

## 2016-04-05 DIAGNOSIS — Z8679 Personal history of other diseases of the circulatory system: Secondary | ICD-10-CM

## 2016-04-08 ENCOUNTER — Ambulatory Visit
Admission: RE | Admit: 2016-04-08 | Discharge: 2016-04-08 | Disposition: A | Discharge status: Home / Self Care | Payer: BLUE CROSS/BLUE SHIELD | Source: Ambulatory Visit | Attending: Thoracic Surgery (Cardiothoracic Vascular Surgery) | Admitting: Thoracic Surgery (Cardiothoracic Vascular Surgery)

## 2016-04-08 ENCOUNTER — Encounter: Payer: Self-pay | Admitting: Thoracic Surgery (Cardiothoracic Vascular Surgery)

## 2016-04-08 ENCOUNTER — Other Ambulatory Visit: Payer: Self-pay | Admitting: Surgical

## 2016-04-08 ENCOUNTER — Ambulatory Visit (INDEPENDENT_AMBULATORY_CARE_PROVIDER_SITE_OTHER): Payer: Self-pay | Admitting: Thoracic Surgery (Cardiothoracic Vascular Surgery)

## 2016-04-08 VITALS — BP 116/78 | HR 78 | Resp 20 | Ht 74.0 in | Wt 290.0 lb

## 2016-04-08 DIAGNOSIS — I4891 Unspecified atrial fibrillation: Secondary | ICD-10-CM

## 2016-04-08 DIAGNOSIS — I4819 Other persistent atrial fibrillation: Secondary | ICD-10-CM

## 2016-04-08 DIAGNOSIS — Z8679 Personal history of other diseases of the circulatory system: Secondary | ICD-10-CM

## 2016-04-08 DIAGNOSIS — Z9889 Other specified postprocedural states: Principal | ICD-10-CM

## 2016-04-08 DIAGNOSIS — I481 Persistent atrial fibrillation: Secondary | ICD-10-CM

## 2016-04-08 DIAGNOSIS — Z79899 Other long term (current) drug therapy: Secondary | ICD-10-CM

## 2016-04-08 MED ORDER — OXYCODONE HCL 5 MG PO TABS: 5.0000 mg | ORAL_TABLET | Freq: Four times a day (QID) | ORAL | 0 refills | Status: DC | PRN

## 2016-04-08 NOTE — Progress Notes (Signed)
Incision healed on right chest s/p Mini MAZE.  Chest tube sites x 2 are healing with minimal drainage.

## 2016-04-08 NOTE — Patient Instructions (Signed)
Continue all previous medications without any changes at this time  You may continue to gradually increase your physical activity as tolerated.  Refrain from any heavy lifting or strenuous use of your arms and shoulders until at least 8 weeks from the time of your surgery, and avoid activities that cause increased pain in your chest on the side of your surgical incision.  Otherwise you may continue to increase activities without any particular limitations.  Increase the intensity and duration of physical activity gradually.  You may return to driving an automobile as long as you are no longer requiring oral narcotic pain relievers during the daytime.  It would be wise to start driving only short distances during the daylight and gradually increase from there as you feel comfortable.   

## 2016-04-08 NOTE — Progress Notes (Signed)
[address]       [place] [address]             [phone]     CARDIOTHORACIC SURGERY OFFICE NOTE  Referring Provider is Hillis Range, MD PCP is Roxanne Mins, PA-C   HPI:  Patient returns to the office today for routine follow-up status post minimally invasive Maze procedure on 03/20/2016. His early postoperative recovery in the hospital was uneventful and he was discharged home on the sixth postoperative day. He was seen in our office several days later because of drainage from his chest tube incision sites. The chest tube sutures had been removed inadvertently prior to hospital discharge.  Since then the patient has done well. The drainage from his chest tube sites resolved fairly quickly. He still has redness involving the skin surrounding the chest tube incisions. Soreness in his chest has gradually improved. He is now using oral pain relievers of only occasionally during the day or to help him sleep. He has had a long history of problems with sleep and surgery has not helped. Overall he feels well and he feels as though he has made considerable progress. He has not had any palpitations or dizzy spells. His exercise tolerance is slowly improving. Appetite is good. Overall he has no complaints.   Current Outpatient Prescriptions  Medication Sig Dispense Refill  . amiodarone (PACERONE) 200 MG tablet TAKE 1 TABLET DAILY 180 tablet 3  . carvedilol (COREG) 3.125 MG tablet Take 3.125 mg by mouth 2 (two) times daily with a meal.    . furosemide (LASIX) 40 MG tablet TAKE 1 TABLET BY MOUTH DAILY 7 tablet 0  . lisinopril (PRINIVIL,ZESTRIL) 20 MG tablet Take 20 mg by mouth 2 (two) times daily.     Marland Kitchen oxyCODONE (OXY IR/ROXICODONE) 5 MG immediate release tablet Take 1-2 tablets (5-10 mg total) by mouth every 6 (six) hours as needed for severe pain. 30 tablet 0  . oxymetazoline (AFRIN) 0.05 % nasal spray Place 2 sprays into both nostrils 2 (two) times daily as needed for  congestion.    . potassium chloride SA (K-DUR,KLOR-CON) 20 MEQ tablet Take 1 tablet (20 mEq total) by mouth daily. 7 tablet 0  . rivaroxaban (XARELTO) 20 MG TABS tablet Take 1 tablet (20 mg total) by mouth daily with supper. 30 tablet 1  . sertraline (ZOLOFT) 100 MG tablet Take 150 mg by mouth every morning.     . simvastatin (ZOCOR) 20 MG tablet TAKE 1 TABLET DAILY 90 tablet 3  . zolpidem (AMBIEN) 5 MG tablet Take 1 tablet (5 mg total) by mouth at bedtime as needed for sleep. 15 tablet 0   No current facility-administered medications for this visit.       Physical Exam:   BP 116/78   Pulse 78   Resp 20   Ht 6\' 2"  (1.88 m)   Wt 290 lb (131.5 kg)   SpO2 98%   BMI 37.23 kg/m   General:  Well-appearing  Chest:   Clear to auscultation  CV:   Regular rate and rhythm  Incisions:  Clean and dry and healing nicely, mild redness surrounding the chest tube incision sites  Abdomen:  Soft and nontender  Extremities:  Warm and well-perfused  Diagnostic Tests:  2 channel telemetry rhythm strip demonstrates normal sinus rhythm   CHEST  2 VIEW  COMPARISON:  PA and lateral chest x-ray of March 29, 2016  FINDINGS: There remains patchy increased density in  the right lung. Small amounts of pleural fluid blunt the posterior costophrenic angles. There is no pneumothorax. The heart is normal in size. The pulmonary vascularity is not engorged. The trachea is midline. The bony thorax is unremarkable.  IMPRESSION: Persistent increased density in the right mid and lower lung predominantly posteriorly unchanged since September 29th but new since March 18, 2016. This likely reflects atelectasis or subtle infiltrate. Are there clinical findings suggestive of pneumonia or CHF?   Electronically Signed   By: David  SwazilandJordan M.D.   On: 04/08/2016 14:05   Impression:  Patient is doing well approximately 3 weeks status post minimally invasive Maze procedure. He is maintaining sinus  rhythm and overall making satisfactory recovery.  Plan:  I have encouraged the patient to continue to gradually increase his physical activity as tolerated. He has been reminded to avoid any sort of heavy lifting or reaching up above his head with his right arm or shoulder. I think he may resume driving an automobile once he no longer requires oral narcotic pain relievers. I have given him a refill prescription for oxycodone.  We have not recommended any other changes to his current medications at this time.  I think the patient would benefit from enrolling in participating in the outpatient cardiac rehabilitation program. The patient will return to our office for routine follow-up in approximately 6 weeks. He will call and return sooner should specific problems or questions arise.    Salvatore Decentlarence H. Cornelius Moraswen, MD 04/08/2016 2:14 PM

## 2016-04-10 ENCOUNTER — Ambulatory Visit (INDEPENDENT_AMBULATORY_CARE_PROVIDER_SITE_OTHER): Payer: BLUE CROSS/BLUE SHIELD | Admitting: Physician Assistant

## 2016-04-10 ENCOUNTER — Encounter: Payer: Self-pay | Admitting: Physician Assistant

## 2016-04-10 VITALS — BP 102/60 | HR 78 | Ht 74.0 in | Wt 286.0 lb

## 2016-04-10 DIAGNOSIS — Z8679 Personal history of other diseases of the circulatory system: Secondary | ICD-10-CM

## 2016-04-10 DIAGNOSIS — I428 Other cardiomyopathies: Secondary | ICD-10-CM | POA: Diagnosis not present

## 2016-04-10 DIAGNOSIS — I481 Persistent atrial fibrillation: Secondary | ICD-10-CM

## 2016-04-10 DIAGNOSIS — Z736 Limitation of activities due to disability: Secondary | ICD-10-CM

## 2016-04-10 DIAGNOSIS — I10 Essential (primary) hypertension: Secondary | ICD-10-CM | POA: Diagnosis not present

## 2016-04-10 DIAGNOSIS — I4819 Other persistent atrial fibrillation: Secondary | ICD-10-CM

## 2016-04-10 DIAGNOSIS — G4733 Obstructive sleep apnea (adult) (pediatric): Secondary | ICD-10-CM

## 2016-04-10 DIAGNOSIS — Z9889 Other specified postprocedural states: Secondary | ICD-10-CM | POA: Diagnosis not present

## 2016-04-10 DIAGNOSIS — I340 Nonrheumatic mitral (valve) insufficiency: Secondary | ICD-10-CM

## 2016-04-10 LAB — BASIC METABOLIC PANEL
BUN: 20 mg/dL (ref 7–25)
CO2: 27 mmol/L (ref 20–31)
Calcium: 9.2 mg/dL (ref 8.6–10.3)
Chloride: 102 mmol/L (ref 98–110)
Creat: 1.2 mg/dL (ref 0.70–1.33)
GLUCOSE: 88 mg/dL (ref 65–99)
POTASSIUM: 5.1 mmol/L (ref 3.5–5.3)
SODIUM: 139 mmol/L (ref 135–146)

## 2016-04-10 MED ORDER — LISINOPRIL 20 MG PO TABS: 20.0000 mg | ORAL_TABLET | Freq: Every day | ORAL | 11 refills | Status: DC

## 2016-04-10 NOTE — Patient Instructions (Addendum)
Medication Instructions:  Decrease Lisinopril to 20 mg in the morning and 10 mg (1/2 tablet) in the evening.  Labwork: Today - BMET   Testing/Procedures: None  Follow-Up: Dr. Hillis Range on Wednesday, July 10, 2016 at 9:30 AM.  Any Other Special Instructions Will Be Listed Below (If Applicable). If you do not hear from cardiac rehabilitation in 2 weeks, call us. If you dizziness does not get much better with decreasing the Lisinopril, call us. Call your primary care provider for follow up on your prostate.  If you need a refill on your cardiac medications before your next appointment, please call your pharmacy.

## 2016-04-10 NOTE — Progress Notes (Signed)
Cardiology Office Note:    Date:  04/10/2016   ID:  MONT HIATT, DOB 1961-02-01, MRN [id number]  PCP:  Roxanne Mins, PA-C  Cardiologist/Electrophysiologist:  Dr. Hillis Range  AF clinic: Rudi Coco, NP  TCTS: [name]  Referring MD: Coralee Rud, PA-C   Chief Complaint  Patient presents with  . Hospitalization Follow-up    s/p Maze    History of Present Illness:    Joseph Garrett is a 55 y.o. male with a hx of persistent AF s/p ablation x 2, prior TIA, HTN, OSA, cardiomyopathy with prior EF 25-30% >> improved to 45-50%.  He has had recurrent AF and has undergone DCCV.  He has failed medical Rx with Amiodarone.  He was last seen by Dr. Hillis Range in 7/17.  The patient noted increasing episodes of symptomatic AF. Surgical Maze was recommended.  LHC demonstrated no sig CAD.  TEE demonstrated EF 45-50% with mild to mod MR.    CHADS2-VASc=4 (HTN, CHF, TIA).  He was admitted 9/20-9/26 and underwent minimally invasive Maze procedure with endocardial over sewing of LA appendage.  Post op course was fairly uneventful.  He remained in NSR.  He remained on Amiodarone and Xarelto was resumed post op.  He had some volume overload and was DC'd on Lasix.  He did have some chest tube drainage and was seen in Dr. Orvan July office.  He then saw [name] for FU on 10/9.  He returns for post hospital FU.  He is here alone.  He is slowly getting better.  His dyspnea on exertion is improved.  He denies chest pain.  His incisions are slowly healing.  He denies fevers.  He denies orthopnea, PND, edema.  He denies syncope. He does get dizzy with standing.    Prior CV studies that were reviewed today include:    Carotid US 03/18/16 Bilateral ICA 1-39%  Chest, Abd, Pelvic CT angio 02/09/16 IMPRESSION: There is no evidence of thoracic or abdominal aortic aneurysm or dissection. Mild coronary artery calcifications are noted suggesting coronary artery disease. Sigmoid diverticulosis is noted  without inflammation.  LHC 01/25/16 LM distal 30 LAD irregs 1. Normal filling pressures.  2. Nonobstructive coronary disease.  3. EF 50-55%.   TEE 01/25/16 EF 45-50, mild diff HK, aorta normal caliber with minimal plaque, midl to mod MR, midl LAE, no R-L shunt, RV-RA gradient 19   Echo (3/15):   EF 45-50%, no RWMA, mild LAE  Nuclear (3/14):   Mild to mod apical atten artifact vs scar  Carotid US (5/13):   No sig ICA stenosis   Past Medical History:  Diagnosis Date  . CHF (congestive heart failure) (HCC)   . Chronic combined systolic and diastolic CHF (congestive heart failure) (HCC)   . Cirrhosis (HCC)    liver scarring on biopsy, presumed to be due to methotrexate  . Depression   . DJD (degenerative joint disease)   . Essential hypertension 08/30/2013  . GERD (gastroesophageal reflux disease)    not present  . Gout   . Hives   . Hypercholesteremia   . Hypertension   . Mitral regurgitation - type I dysfunction - mild to moderate   . Obesity   . OSA (obstructive sleep apnea)    mild, uses CPAP  . Paroxysmal atrial fibrillation (HCC)   . Persistent atrial fibrillation (HCC) 03/02/2012  . Psoriasis   . S/P Minimally invasive maze operation for atrial fibrillation 03/20/2016   Complete bilateral atrial lesion set using  cryothermy and bipolar radiofrequency ablation via right mini thoracotomy approach with oversewing of LA appendage  . Shortness of breath dyspnea    exertion  . Syncope 02/21/2014  . Thrombocytopenia (HCC)   . TIA (transient ischemic attack)     Past Surgical History:  Procedure Laterality Date  . ABLATION OF DYSRHYTHMIC FOCUS  09/17/2012  . ATRIAL FIBRILLATION ABLATION  03/17/12   PVI and CTI ablation by Dr [name]  . ATRIAL FIBRILLATION ABLATION N/A 03/17/2012   Procedure: ATRIAL FIBRILLATION ABLATION;  Surgeon: Hillis Range, MD;  Location: [facility] CATH LAB;  Service: Cardiovascular;  Laterality: N/A;  . ATRIAL FIBRILLATION ABLATION N/A 09/18/2012   Procedure:  ATRIAL FIBRILLATION ABLATION;  Surgeon: Hillis Range, MD;  Location: [facility] CATH LAB;  Service: Cardiovascular;  Laterality: N/A;  . CARDIAC CATHETERIZATION N/A 01/25/2016   Procedure: Right/Left Heart Cath and Coronary Angiography;  Surgeon: Laurey Morale, MD;  Location: [facility] INVASIVE CV LAB;  Service: Cardiovascular;  Laterality: N/A;  . CARDIOVERSION N/A 10/09/2012   Procedure: CARDIOVERSION;  Surgeon: Laurey Morale, MD;  Location: [facility] ENDOSCOPY;  Service: Cardiovascular;  Laterality: N/A;  . CARDIOVERSION N/A 02/22/2014   Procedure: CARDIOVERSION;  Surgeon: Quintella Reichert, MD;  Location: [facility] ENDOSCOPY;  Service: Cardiovascular;  Laterality: N/A;  . CARDIOVERSION N/A 12/29/2015   Procedure: CARDIOVERSION;  Surgeon: Hillis Range, MD;  Location: [facility] OR;  Service: Cardiovascular;  Laterality: N/A;  . CHOLECYSTECTOMY  2011  . ELBOW ARTHROSCOPY Left 08/2014  . ELECTROPHYSIOLOGIC STUDY N/A 12/29/2015   Procedure: Cardioversion;  Surgeon: Hillis Range, MD;  Location: [facility] INVASIVE CV LAB;  Service: Cardiovascular;  Laterality: N/A;  . EYE SURGERY Bilateral 04/2015   cataract surgery  . KNEE ARTHROSCOPY Right   . MINIMALLY INVASIVE MAZE PROCEDURE N/A 03/20/2016   Procedure: MINIMALLY INVASIVE MAZE PROCEDURE;  Surgeon: Purcell Nails, MD;  Location: [facility] OR;  Service: Open Heart Surgery;  Laterality: N/A;  . NOSE SURGERY     Turbinates  . RECONSTRUCTION OF NOSE    . TEE WITHOUT CARDIOVERSION  03/16/2012   Procedure: TRANSESOPHAGEAL ECHOCARDIOGRAM (TEE);  Surgeon: [name], MD;  Location: [facility] ENDOSCOPY;  Service: Cardiovascular;  Laterality: N/A;  . TEE WITHOUT CARDIOVERSION N/A 09/17/2012   Procedure: TRANSESOPHAGEAL ECHOCARDIOGRAM (TEE);  Surgeon: Lewayne Bunting, MD;  Location: [facility] ENDOSCOPY;  Service: Cardiovascular;  Laterality: N/A;  . TEE WITHOUT CARDIOVERSION N/A 01/25/2016   Procedure: TRANSESOPHAGEAL ECHOCARDIOGRAM (TEE);  Surgeon: Laurey Morale, MD;  Location: [facility] ENDOSCOPY;  Service:  Cardiovascular;  Laterality: N/A;  . TEE WITHOUT CARDIOVERSION N/A 03/20/2016   Procedure: TRANSESOPHAGEAL ECHOCARDIOGRAM (TEE);  Surgeon: Purcell Nails, MD;  Location: [facility] OR;  Service: Open Heart Surgery;  Laterality: N/A;  . TENDON REPAIR     Right Forearm  . TENDON REPAIR Right 1990's   forearm    Current Medications: Current Meds  Medication Sig  . amiodarone (PACERONE) 200 MG tablet TAKE 1 TABLET DAILY  . carvedilol (COREG) 3.125 MG tablet Take 3.125 mg by mouth 2 (two) times daily with a meal.  . lisinopril (PRINIVIL,ZESTRIL) 20 MG tablet Take 1 tablet (20 mg total) by mouth daily. Talk 1/2 tablet (10 mg) at night.  Marland Kitchen oxyCODONE (OXY IR/ROXICODONE) 5 MG immediate release tablet Take 1-2 tablets (5-10 mg total) by mouth every 6 (six) hours as needed for severe pain.  Marland Kitchen oxymetazoline (AFRIN) 0.05 % nasal spray Place 2 sprays into both nostrils 2 (two) times daily as needed for congestion.  . rivaroxaban (XARELTO) 20 MG  TABS tablet Take 1 tablet (20 mg total) by mouth daily with supper.  . sertraline (ZOLOFT) 100 MG tablet Take 150 mg by mouth every morning.   . simvastatin (ZOCOR) 20 MG tablet TAKE 1 TABLET DAILY  . zolpidem (AMBIEN) 5 MG tablet Take 1 tablet (5 mg total) by mouth at bedtime as needed for sleep.  . [DISCONTINUED] lisinopril (PRINIVIL,ZESTRIL) 20 MG tablet Take 20 mg by mouth 2 (two) times daily.      Allergies:   Penicillins   Social History   Social History  . Marital status: Married    Spouse name: N/A  . Number of children: N/A  . Years of education: N/A   Social History Main Topics  . Smoking status: Never Smoker  . Smokeless tobacco: Never Used  . Alcohol use No  . Drug use: No  . Sexual activity: Not Asked   Other Topics Concern  . None   Social History Narrative   Lives in [place] with spouse.  3 children are healthy.     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     Family History:  The patient's family history includes  Heart attack in his father.   ROS:   Please see the history of present illness.    Review of Systems  Cardiovascular: Positive for dyspnea on exertion.  Genitourinary: Positive for incomplete emptying.  Neurological: Positive for dizziness.   All other systems reviewed and are negative.   EKGs/Labs/Other Test Reviewed:    EKG:  EKG is   ordered today.  The ekg ordered today demonstrates NSR, HR 79, normal axis, nonspecific ST-T wave changes, low voltage QRS, QTC 454 ms  Recent Labs: 12/27/2015: TSH 0.282 03/18/2016: ALT 40 03/21/2016: Magnesium 2.1 03/26/2016: Hemoglobin 10.8; Platelets 158 04/10/2016: BUN 20; Creat 1.20; Potassium 5.1; Sodium 139   Recent Lipid Panel No results found for: CHOL, TRIG, HDL, CHOLHDL, VLDL, LDLCALC, LDLDIRECT   Physical Exam:    VS:  BP 102/60   Pulse 78   Ht 6\' 2"  (1.88 m)   Wt 286 lb (129.7 kg)   SpO2 97%   BMI 36.72 kg/m     Wt Readings from Last 3 Encounters:  04/10/16 286 lb (129.7 kg)  04/08/16 290 lb (131.5 kg)  03/29/16 294 lb 12.8 oz (133.7 kg)     Physical Exam  Constitutional: He is oriented to person, place, and time. He appears well-developed and well-nourished.  HENT:  Head: Normocephalic and atraumatic.  Eyes: No scleral icterus.  Neck: No JVD present.  Cardiovascular: Normal rate, regular rhythm and normal heart sounds.   No murmur heard. Pulmonary/Chest: He has decreased breath sounds. He has no wheezes. He has no rhonchi. He has no rales.  R chest incision well healed.  R chest tube sites with eschar formation.  Abdominal: Soft. There is no tenderness.  Musculoskeletal: He exhibits no edema.  Neurological: He is alert and oriented to person, place, and time.  Skin: Skin is warm and dry.  Psychiatric: He has a normal mood and affect.    ASSESSMENT:    1. S/P Maze operation for atrial fibrillation   2. Persistent atrial fibrillation (HCC)   3. Essential hypertension   4. NICM (nonischemic cardiomyopathy) (HCC)    5. OSA (obstructive sleep apnea)   6. Mitral valve insufficiency, unspecified etiology    PLAN:    In order of problems listed above:  1. S/p Surgical Maze - He is slowly recovering.  He feels  better each day. He has FU with Dr. Cornelius Moraswen already. Will refer to cardiac rehabilitation.  2. Persistent AF - Now s/p Maze.  He is maintaining NSR.  Consider DCing Amiodarone at some point in the future.  Defer to Dr. Hillis RangeJames Allred.  Continue Xarelto.  3. HTN - BP somewhat low and he gets dizzy with standing. Decrease Lisinopril to 20 in A and 10 in P.  If still dizzy, we can consider decreasing Lisinopril to 20 QD.  4. NICM - No sig CAD on LHC in 7/17.  His EF is 45-50%.  Continue beta-blocker, ACE inhibitor.  5. OSA - Continue CPAP.   6. Mitral Regurgitation - Mild to mod by TEE.  Follow clinically.  Consider repeat Echo in 12-24 mos.    Medication Adjustments/Labs and Tests Ordered: Current medicines are reviewed at length with the patient today.  Concerns regarding medicines are outlined above.  Medication changes, Labs and Tests ordered today are outlined in the Patient Instructions noted below. Patient Instructions  Medication Instructions:  Decrease Lisinopril to 20 mg in the morning and 10 mg (1/2 tablet) in the evening.  Labwork: Today - BMET   Testing/Procedures: None  Follow-Up: Dr. Hillis RangeJames Allred on Wednesday, July 10, 2016 at 9:30 AM.  Any Other Special Instructions Will Be Listed Below (If Applicable). If you do not hear from cardiac rehabilitation in 2 weeks, call us. If you dizziness does not get much better with decreasing the Lisinopril, call us. Call your primary care provider for follow up on your prostate.  If you need a refill on your cardiac medications before your next appointment, please call your pharmacy.   Signed, Tereso NewcomerScott Weaver, PA-C  04/10/2016 5:46 PM    [facility] [address], [place], [place]  [address] Phone: [phone]) [phone];  Fax: [phone]

## 2016-04-12 ENCOUNTER — Encounter (HOSPITAL_COMMUNITY): Payer: Self-pay | Admitting: *Deleted

## 2016-04-15 NOTE — Progress Notes (Signed)
erronous encounter

## 2016-04-22 ENCOUNTER — Emergency Department (HOSPITAL_COMMUNITY): Payer: BLUE CROSS/BLUE SHIELD

## 2016-04-22 ENCOUNTER — Observation Stay (HOSPITAL_COMMUNITY)
Admission: EM | Admit: 2016-04-22 | Discharge: 2016-04-24 | DRG: 315 | Disposition: A | Discharge status: Home / Self Care | Payer: BLUE CROSS/BLUE SHIELD | Attending: Internal Medicine | Admitting: Internal Medicine

## 2016-04-22 ENCOUNTER — Encounter (HOSPITAL_COMMUNITY): Payer: Self-pay | Admitting: Emergency Medicine

## 2016-04-22 DIAGNOSIS — G4730 Sleep apnea, unspecified: Secondary | ICD-10-CM | POA: Diagnosis present

## 2016-04-22 DIAGNOSIS — G4733 Obstructive sleep apnea (adult) (pediatric): Secondary | ICD-10-CM | POA: Diagnosis present

## 2016-04-22 DIAGNOSIS — I959 Hypotension, unspecified: Principal | ICD-10-CM | POA: Diagnosis present

## 2016-04-22 DIAGNOSIS — Z9889 Other specified postprocedural states: Secondary | ICD-10-CM

## 2016-04-22 DIAGNOSIS — I110 Hypertensive heart disease with heart failure: Secondary | ICD-10-CM | POA: Diagnosis not present

## 2016-04-22 DIAGNOSIS — Z8679 Personal history of other diseases of the circulatory system: Secondary | ICD-10-CM

## 2016-04-22 DIAGNOSIS — E7800 Pure hypercholesterolemia, unspecified: Secondary | ICD-10-CM | POA: Diagnosis present

## 2016-04-22 DIAGNOSIS — Z8249 Family history of ischemic heart disease and other diseases of the circulatory system: Secondary | ICD-10-CM

## 2016-04-22 DIAGNOSIS — I5022 Chronic systolic (congestive) heart failure: Secondary | ICD-10-CM | POA: Diagnosis not present

## 2016-04-22 DIAGNOSIS — Z8673 Personal history of transient ischemic attack (TIA), and cerebral infarction without residual deficits: Secondary | ICD-10-CM | POA: Diagnosis not present

## 2016-04-22 DIAGNOSIS — Z7901 Long term (current) use of anticoagulants: Secondary | ICD-10-CM | POA: Diagnosis not present

## 2016-04-22 DIAGNOSIS — Z880 Allergy status to penicillin: Secondary | ICD-10-CM | POA: Diagnosis not present

## 2016-04-22 DIAGNOSIS — D696 Thrombocytopenia, unspecified: Secondary | ICD-10-CM | POA: Diagnosis present

## 2016-04-22 DIAGNOSIS — I272 Pulmonary hypertension, unspecified: Secondary | ICD-10-CM | POA: Diagnosis not present

## 2016-04-22 DIAGNOSIS — D649 Anemia, unspecified: Secondary | ICD-10-CM | POA: Diagnosis present

## 2016-04-22 DIAGNOSIS — I4819 Other persistent atrial fibrillation: Secondary | ICD-10-CM | POA: Diagnosis present

## 2016-04-22 DIAGNOSIS — I10 Essential (primary) hypertension: Secondary | ICD-10-CM | POA: Diagnosis present

## 2016-04-22 DIAGNOSIS — R55 Syncope and collapse: Secondary | ICD-10-CM | POA: Diagnosis present

## 2016-04-22 DIAGNOSIS — E861 Hypovolemia: Secondary | ICD-10-CM | POA: Diagnosis not present

## 2016-04-22 DIAGNOSIS — I428 Other cardiomyopathies: Secondary | ICD-10-CM | POA: Diagnosis not present

## 2016-04-22 DIAGNOSIS — R079 Chest pain, unspecified: Secondary | ICD-10-CM

## 2016-04-22 DIAGNOSIS — R0789 Other chest pain: Secondary | ICD-10-CM | POA: Diagnosis not present

## 2016-04-22 LAB — COMPREHENSIVE METABOLIC PANEL
ALK PHOS: 54 U/L (ref 38–126)
ALT: 14 U/L — ABNORMAL LOW (ref 17–63)
ANION GAP: 3 — AB (ref 5–15)
AST: 13 U/L — ABNORMAL LOW (ref 15–41)
Albumin: 1.9 g/dL — ABNORMAL LOW (ref 3.5–5.0)
BILIRUBIN TOTAL: 0.7 mg/dL (ref 0.3–1.2)
BUN: 11 mg/dL (ref 6–20)
CALCIUM: 5.3 mg/dL — AB (ref 8.9–10.3)
CO2: 16 mmol/L — AB (ref 22–32)
Chloride: 122 mmol/L — ABNORMAL HIGH (ref 101–111)
Creatinine, Ser: 0.9 mg/dL (ref 0.61–1.24)
GFR calc non Af Amer: >60 mL/min (ref >60)
Glucose, Bld: 81 mg/dL (ref 65–99)
POTASSIUM: 3.2 mmol/L — AB (ref 3.5–5.1)
SODIUM: 141 mmol/L (ref 135–145)
TOTAL PROTEIN: 3.4 g/dL — AB (ref 6.5–8.1)

## 2016-04-22 LAB — I-STAT CG4 LACTIC ACID, ED
LACTIC ACID, VENOUS: 0.66 mmol/L (ref 0.5–1.9)
Lactic Acid, Venous: <0.3 mmol/L — ABNORMAL LOW (ref 0.5–1.9)

## 2016-04-22 LAB — CBC WITH DIFFERENTIAL/PLATELET
BASOS PCT: 0 %
Basophils Absolute: 0 10*3/uL (ref 0.0–0.1)
EOS ABS: 0 10*3/uL (ref 0.0–0.7)
Eosinophils Relative: 0 %
HCT: 24.8 % — ABNORMAL LOW (ref 39.0–52.0)
HEMOGLOBIN: 8.2 g/dL — AB (ref 13.0–17.0)
Lymphocytes Relative: 5 %
Lymphs Abs: 0.3 10*3/uL — ABNORMAL LOW (ref 0.7–4.0)
MCH: 30.5 pg (ref 26.0–34.0)
MCHC: 33.1 g/dL (ref 30.0–36.0)
MCV: 92.2 fL (ref 78.0–100.0)
Monocytes Absolute: 0.7 10*3/uL (ref 0.1–1.0)
Monocytes Relative: 9 %
NEUTROS ABS: 6.3 10*3/uL (ref 1.7–7.7)
NEUTROS PCT: 86 %
Platelets: 122 10*3/uL — ABNORMAL LOW (ref 150–400)
RBC: 2.69 MIL/uL — AB (ref 4.22–5.81)
RDW: 13.4 % (ref 11.5–15.5)
WBC: 7.4 10*3/uL (ref 4.0–10.5)

## 2016-04-22 LAB — I-STAT TROPONIN, ED: TROPONIN I, POC: 0 ng/mL (ref 0.00–0.08)

## 2016-04-22 LAB — POC OCCULT BLOOD, ED: FECAL OCCULT BLD: NEGATIVE

## 2016-04-22 NOTE — ED Notes (Signed)
Verbal order receive from Dr. Judd Lien for 2nd bolus for hypotension.

## 2016-04-22 NOTE — ED Notes (Signed)
Critical calcium of 5.2 called by lab.

## 2016-04-22 NOTE — ED Provider Notes (Signed)
[facility]-EMERGENCY DEPT Provider Note   CSN: [id number] Arrival date & time: 04/22/16  1922     History   Chief Complaint Chief Complaint  Patient presents with  . Loss of Consciousness  . Chest Pain    HPI Joseph Garrett is a 55 y.o. male.  Patient is a 55 year old male with past medical history of paroxysmal atrial fibrillation, CHF, and hypertension. He recently underwent minimally invasive maze procedure for his atrial fibrillation and is currently taking Xarelto. He presents here today for evaluation of weakness. He reports trying to go to the gym this morning when he felt like he had no energy and could not run on the treadmill. He went to urgent care where he experienced some sort of syncopal type episode and vomiting. He was found to have low blood pressure and was sent here for further evaluation. He also reports hitting his head on a beam at work 2 days ago, however is not experiencing any headaches.  He denies any fevers or chills. He denies any abdominal pain or diarrhea.      Past Medical History:  Diagnosis Date  . CHF (congestive heart failure) (HCC)   . Chronic combined systolic and diastolic CHF (congestive heart failure) (HCC)   . Cirrhosis (HCC)    liver scarring on biopsy, presumed to be due to methotrexate  . Depression   . DJD (degenerative joint disease)   . Essential hypertension 08/30/2013  . GERD (gastroesophageal reflux disease)    not present  . Gout   . Hives   . Hypercholesteremia   . Hypertension   . Mitral regurgitation - type I dysfunction - mild to moderate   . Obesity   . OSA (obstructive sleep apnea)    mild, uses CPAP  . Paroxysmal atrial fibrillation (HCC)   . Persistent atrial fibrillation (HCC) 03/02/2012  . Psoriasis   . S/P Minimally invasive maze operation for atrial fibrillation 03/20/2016   Complete bilateral atrial lesion set using cryothermy and bipolar radiofrequency ablation via right mini thoracotomy approach with  oversewing of LA appendage  . Shortness of breath dyspnea    exertion  . Syncope 02/21/2014  . Thrombocytopenia (HCC)   . TIA (transient ischemic attack)     Patient Active Problem List   Diagnosis Date Noted  . S/P Minimally invasive maze operation for atrial fibrillation 03/20/2016  . Mitral regurgitation - type I dysfunction - mild to moderate   . Abnormal PFTs (pulmonary function tests) 04/28/2015  . Chronic combined systolic and diastolic CHF (congestive heart failure) (HCC)   . Syncope 02/21/2014  . NICM (nonischemic cardiomyopathy) (HCC) 08/30/2013  . Sinus bradycardia 08/30/2013  . Fatigue 08/30/2013  . Essential hypertension 08/30/2013  . Persistent atrial fibrillation (HCC) 03/02/2012  . Hives   . Hypersomnia   . SOB (shortness of breath)   . Sleep apnea   . Hypercholesteremia   . Thrombocytopenia (HCC)   . OSA (obstructive sleep apnea)   . Obesity   . Hyperglycemia   . Anxiety   . Gout     Past Surgical History:  Procedure Laterality Date  . ABLATION OF DYSRHYTHMIC FOCUS  09/17/2012  . ATRIAL FIBRILLATION ABLATION  03/17/12   PVI and CTI ablation by Dr Johney FrameAllred  . ATRIAL FIBRILLATION ABLATION N/A 03/17/2012   Procedure: ATRIAL FIBRILLATION ABLATION;  Surgeon: Hillis RangeJames Allred, MD;  [facility];  Service: Cardiovascular;  Laterality: N/A;  . ATRIAL FIBRILLATION ABLATION N/A 09/18/2012   Procedure: ATRIAL FIBRILLATION ABLATION;  Surgeon: Hillis Range, MD;  Location: [facility] CATH LAB;  Service: Cardiovascular;  Laterality: N/A;  . CARDIAC CATHETERIZATION N/A 01/25/2016   Procedure: Right/Left Heart Cath and Coronary Angiography;  Surgeon: Laurey Morale, MD;  Location: [facility] INVASIVE CV LAB;  Service: Cardiovascular;  Laterality: N/A;  . CARDIOVERSION N/A 10/09/2012   Procedure: CARDIOVERSION;  Surgeon: Laurey Morale, MD;  Location: [facility] ENDOSCOPY;  Service: Cardiovascular;  Laterality: N/A;  . CARDIOVERSION N/A 02/22/2014   Procedure: CARDIOVERSION;  Surgeon: Quintella Reichert, MD;  Location: [facility] ENDOSCOPY;  Service: Cardiovascular;  Laterality: N/A;  . CARDIOVERSION N/A 12/29/2015   Procedure: CARDIOVERSION;  Surgeon: Hillis Range, MD;  Location: [facility] OR;  Service: Cardiovascular;  Laterality: N/A;  . CHOLECYSTECTOMY  2011  . ELBOW ARTHROSCOPY Left 08/2014  . ELECTROPHYSIOLOGIC STUDY N/A 12/29/2015   Procedure: Cardioversion;  Surgeon: Hillis Range, MD;  Location: [facility] INVASIVE CV LAB;  Service: Cardiovascular;  Laterality: N/A;  . EYE SURGERY Bilateral 04/2015   cataract surgery  . KNEE ARTHROSCOPY Right   . MINIMALLY INVASIVE MAZE PROCEDURE N/A 03/20/2016   Procedure: MINIMALLY INVASIVE MAZE PROCEDURE;  Surgeon: Purcell Nails, MD;  Location: [facility] OR;  Service: Open Heart Surgery;  Laterality: N/A;  . NOSE SURGERY     Turbinates  . RECONSTRUCTION OF NOSE    . TEE WITHOUT CARDIOVERSION  03/16/2012   Procedure: TRANSESOPHAGEAL ECHOCARDIOGRAM (TEE);  Surgeon: [name], MD;  Location: [facility] ENDOSCOPY;  Service: Cardiovascular;  Laterality: N/A;  . TEE WITHOUT CARDIOVERSION N/A 09/17/2012   Procedure: TRANSESOPHAGEAL ECHOCARDIOGRAM (TEE);  Surgeon: Lewayne Bunting, MD;  Location: [facility] ENDOSCOPY;  Service: Cardiovascular;  Laterality: N/A;  . TEE WITHOUT CARDIOVERSION N/A 01/25/2016   Procedure: TRANSESOPHAGEAL ECHOCARDIOGRAM (TEE);  Surgeon: Laurey Morale, MD;  Location: [facility] ENDOSCOPY;  Service: Cardiovascular;  Laterality: N/A;  . TEE WITHOUT CARDIOVERSION N/A 03/20/2016   Procedure: TRANSESOPHAGEAL ECHOCARDIOGRAM (TEE);  Surgeon: Purcell Nails, MD;  Location: [facility] OR;  Service: Open Heart Surgery;  Laterality: N/A;  . TENDON REPAIR     Right Forearm  . TENDON REPAIR Right 1990's   forearm       Home Medications    Prior to Admission medications   Medication Sig Start Date End Date Taking? Authorizing Provider  amiodarone (PACERONE) 200 MG tablet TAKE 1 TABLET DAILY 02/01/16   Hillis Range, MD  carvedilol (COREG) 3.125 MG tablet Take 3.125 mg by mouth 2 (two)  times daily with a meal.    Historical Provider, MD  lisinopril (PRINIVIL,ZESTRIL) 20 MG tablet Take 1 tablet (20 mg total) by mouth daily. Talk 1/2 tablet (10 mg) at night. 04/10/16   Beatrice Lecher, PA-C  oxyCODONE (OXY IR/ROXICODONE) 5 MG immediate release tablet Take 1-2 tablets (5-10 mg total) by mouth every 6 (six) hours as needed for severe pain. 04/08/16   Purcell Nails, MD  oxymetazoline (AFRIN) 0.05 % nasal spray Place 2 sprays into both nostrils 2 (two) times daily as needed for congestion.    Historical Provider, MD  rivaroxaban (XARELTO) 20 MG TABS tablet Take 1 tablet (20 mg total) by mouth daily with supper. 03/26/16   Wayne E Gold, PA-C  sertraline (ZOLOFT) 100 MG tablet Take 150 mg by mouth every morning.     Historical Provider, MD  simvastatin (ZOCOR) 20 MG tablet TAKE 1 TABLET DAILY 01/29/16   Hillis Range, MD  zolpidem (AMBIEN) 5 MG tablet Take 1 tablet (5 mg total) by mouth at bedtime as needed for sleep.  03/18/16 06/16/16  Purcell Nails, MD    Family History Family History  Problem Relation Age of Onset  . Heart attack Father   . Heart disease    . Diabetes      Social History Social History  Substance Use Topics  . Smoking status: Never Smoker  . Smokeless tobacco: Never Used  . Alcohol use No     Allergies   Penicillins   Review of Systems Review of Systems  All other systems reviewed and are negative.    Physical Exam Updated Vital Signs BP (!) 86/56 (BP Location: Right Arm)   Pulse 87   Temp 98.3 F (36.8 C) (Oral)   Resp 22   Ht 6\' 2"  (1.88 m)   Wt 293 lb (132.9 kg)   SpO2 96%   BMI 37.62 kg/m   Physical Exam  Constitutional: He is oriented to person, place, and time. He appears well-developed and well-nourished. No distress.  HENT:  Head: Normocephalic and atraumatic.  Mouth/Throat: Oropharynx is clear and moist.  Neck: Normal range of motion. Neck supple.  Cardiovascular: Normal rate and regular rhythm.  Exam reveals no friction  rub.   No murmur heard. Pulmonary/Chest: Effort normal and breath sounds normal. No respiratory distress. He has no wheezes. He has no rales.  Abdominal: Soft. Bowel sounds are normal. He exhibits no distension. There is no tenderness.  Musculoskeletal: Normal range of motion. He exhibits no edema.  Neurological: He is alert and oriented to person, place, and time. Coordination normal.  Skin: Skin is warm and dry. He is not diaphoretic.  Nursing note and vitals reviewed.    ED Treatments / Results  Labs (all labs ordered are listed, but only abnormal results are displayed) Labs Reviewed  CULTURE, BLOOD (ROUTINE X 2)  CULTURE, BLOOD (ROUTINE X 2)  URINE CULTURE  COMPREHENSIVE METABOLIC PANEL  CBC WITH DIFFERENTIAL/PLATELET  URINALYSIS, ROUTINE W REFLEX MICROSCOPIC (NOT AT [facility])  I-STAT BETA HCG BLOOD, ED ([facility], [facility], [facility] ONLY)  I-STAT CG4 LACTIC ACID, ED    EKG  EKG Interpretation  Date/Time:  Monday April 22 2016 19:30:02 EDT Ventricular Rate:  86 PR Interval:    QRS Duration: 92 QT Interval:  376 QTC Calculation: 450 R Axis:   11 Text Interpretation:  Sinus rhythm Low voltage, precordial leads Confirmed by Ruthann Angulo  MD, Hayden Kihara ([id number]) on 04/22/2016 8:18:10 PM       Radiology No results found.  Procedures Procedures (including critical care time)  Medications Ordered in ED Medications - No data to display   Initial Impression / Assessment and Plan / ED Course  I have reviewed the triage vital signs and the nursing notes.  Pertinent labs & imaging results that were available during my care of the patient were reviewed by me and considered in my medical decision making (see chart for details).  Clinical Course    Patient is a 55 year old male with history of paroxysmal A. fib, cardiomyopathy, and is status post Maze procedure and taking Xarelto. He presents today for weakness. He has several abnormalities in his workup including hypotension, anemia, and hypocalcemia,  the etiology of which I am uncertain.  He was given 2 L of normal saline, however remains hypotensive with blood pressures in the 80s systolic. He does not appear septic. He is afebrile with no white count and no complaints that makes me suspect infection. His hemoglobin has dropped from 10.8-8.2 in the past month, however his stool is guaiac negative.  This is  somewhat a confusing clinical picture. I have consulted the hospitalist who will admit and further workup the patient for these abnormalities. Dr. Toniann Fail to admit.  Final Clinical Impressions(s) / ED Diagnoses   Final diagnoses:  None    New Prescriptions New Prescriptions   No medications on file     Geoffery Lyons, MD 04/22/16 2258

## 2016-04-22 NOTE — ED Notes (Signed)
Patient transported to X-ray 

## 2016-04-22 NOTE — ED Triage Notes (Signed)
Per EMS, pt from urgent care with c/o syncope and chest pain. Chest pain began last night and radiated to the left neck and shoulder. Pt took muscle relaxer and pain went away. Pain returned this morning in the same locations. Pt experienced syncopal episode with vomiting at the urgent care. Initial BP-70/40, BP after 1,053mL bolus-80/40.

## 2016-04-23 ENCOUNTER — Encounter (HOSPITAL_COMMUNITY): Payer: Self-pay | Admitting: Internal Medicine

## 2016-04-23 ENCOUNTER — Observation Stay (HOSPITAL_COMMUNITY): Payer: BLUE CROSS/BLUE SHIELD

## 2016-04-23 DIAGNOSIS — I959 Hypotension, unspecified: Secondary | ICD-10-CM | POA: Diagnosis not present

## 2016-04-23 DIAGNOSIS — R55 Syncope and collapse: Secondary | ICD-10-CM

## 2016-04-23 LAB — RAPID URINE DRUG SCREEN, HOSP PERFORMED
AMPHETAMINES: NOT DETECTED
Barbiturates: NOT DETECTED
Benzodiazepines: NOT DETECTED
Cocaine: NOT DETECTED
Opiates: POSITIVE — AB
Tetrahydrocannabinol: NOT DETECTED

## 2016-04-23 LAB — CBC WITH DIFFERENTIAL/PLATELET
BASOS ABS: 0 10*3/uL (ref 0.0–0.1)
BASOS PCT: 0 %
EOS ABS: 0.1 10*3/uL (ref 0.0–0.7)
EOS PCT: 1 %
HCT: 31.5 % — ABNORMAL LOW (ref 39.0–52.0)
Hemoglobin: 10.4 g/dL — ABNORMAL LOW (ref 13.0–17.0)
LYMPHS PCT: 11 %
Lymphs Abs: 0.9 10*3/uL (ref 0.7–4.0)
MCH: 30.1 pg (ref 26.0–34.0)
MCHC: 33 g/dL (ref 30.0–36.0)
MCV: 91.3 fL (ref 78.0–100.0)
MONO ABS: 0.7 10*3/uL (ref 0.1–1.0)
Monocytes Relative: 9 %
Neutro Abs: 6.2 10*3/uL (ref 1.7–7.7)
Neutrophils Relative %: 79 %
PLATELETS: 153 10*3/uL (ref 150–400)
RBC: 3.45 MIL/uL — ABNORMAL LOW (ref 4.22–5.81)
RDW: 13.4 % (ref 11.5–15.5)
WBC: 7.8 10*3/uL (ref 4.0–10.5)

## 2016-04-23 LAB — TYPE AND SCREEN
ABO/RH(D): A POS
Antibody Screen: NEGATIVE

## 2016-04-23 LAB — BASIC METABOLIC PANEL
Anion gap: 6 (ref 5–15)
BUN: 11 mg/dL (ref 6–20)
CHLORIDE: 105 mmol/L (ref 101–111)
CO2: 25 mmol/L (ref 22–32)
Calcium: 8.3 mg/dL — ABNORMAL LOW (ref 8.9–10.3)
Creatinine, Ser: 1.16 mg/dL (ref 0.61–1.24)
GFR calc Af Amer: >60 mL/min (ref >60)
GFR calc non Af Amer: >60 mL/min (ref >60)
GLUCOSE: 143 mg/dL — AB (ref 65–99)
POTASSIUM: 4.3 mmol/L (ref 3.5–5.1)
Sodium: 136 mmol/L (ref 135–145)

## 2016-04-23 LAB — CBC
HEMATOCRIT: 33.6 % — AB (ref 39.0–52.0)
Hemoglobin: 10.7 g/dL — ABNORMAL LOW (ref 13.0–17.0)
MCH: 29.5 pg (ref 26.0–34.0)
MCHC: 31.8 g/dL (ref 30.0–36.0)
MCV: 92.6 fL (ref 78.0–100.0)
PLATELETS: 156 10*3/uL (ref 150–400)
RBC: 3.63 MIL/uL — AB (ref 4.22–5.81)
RDW: 13.7 % (ref 11.5–15.5)
WBC: 5.5 10*3/uL (ref 4.0–10.5)

## 2016-04-23 LAB — URINALYSIS, ROUTINE W REFLEX MICROSCOPIC
Glucose, UA: NEGATIVE mg/dL
HGB URINE DIPSTICK: NEGATIVE
Ketones, ur: NEGATIVE mg/dL
Leukocytes, UA: NEGATIVE
Nitrite: NEGATIVE
PH: 6.5 (ref 5.0–8.0)
Protein, ur: NEGATIVE mg/dL
SPECIFIC GRAVITY, URINE: 1.017 (ref 1.005–1.030)

## 2016-04-23 LAB — APTT: aPTT: 49 seconds — ABNORMAL HIGH (ref 24–36)

## 2016-04-23 LAB — HEPATIC FUNCTION PANEL
ALK PHOS: 80 U/L (ref 38–126)
ALT: 21 U/L (ref 17–63)
AST: 18 U/L (ref 15–41)
Albumin: 2.8 g/dL — ABNORMAL LOW (ref 3.5–5.0)
BILIRUBIN DIRECT: 0.2 mg/dL (ref 0.1–0.5)
BILIRUBIN INDIRECT: 0.5 mg/dL (ref 0.3–0.9)
BILIRUBIN TOTAL: 0.7 mg/dL (ref 0.3–1.2)
Total Protein: 5.3 g/dL — ABNORMAL LOW (ref 6.5–8.1)

## 2016-04-23 LAB — TROPONIN I: Troponin I: <0.03 ng/mL (ref <0.03)

## 2016-04-23 LAB — T4, FREE: Free T4: 1.21 ng/dL — ABNORMAL HIGH (ref 0.61–1.12)

## 2016-04-23 LAB — HEPARIN LEVEL (UNFRACTIONATED): HEPARIN UNFRACTIONATED: 0.39 [IU]/mL (ref 0.30–0.70)

## 2016-04-23 LAB — MRSA PCR SCREENING: MRSA BY PCR: POSITIVE — AB

## 2016-04-23 LAB — LACTIC ACID, PLASMA: Lactic Acid, Venous: 1.1 mmol/L (ref 0.5–1.9)

## 2016-04-23 LAB — TSH: TSH: 0.137 u[IU]/mL — AB (ref 0.350–4.500)

## 2016-04-23 MED ORDER — ONDANSETRON HCL 4 MG PO TABS: 4.0000 mg | ORAL_TABLET | Freq: Four times a day (QID) | ORAL | Status: DC | PRN

## 2016-04-23 MED ORDER — ACETAMINOPHEN 650 MG RE SUPP: 650.0000 mg | Freq: Four times a day (QID) | RECTAL | Status: DC | PRN

## 2016-04-23 MED ORDER — SIMVASTATIN 20 MG PO TABS
20.0000 mg | ORAL_TABLET | Freq: Every day | ORAL | Status: DC
  Administered 2016-04-23 – 2016-04-24 (×2): 20 mg via ORAL
  Filled 2016-04-23 (×2): qty 1

## 2016-04-23 MED ORDER — SODIUM CHLORIDE 0.9 % IV SOLN
INTRAVENOUS | Status: AC
  Administered 2016-04-23: 02:00:00 via INTRAVENOUS

## 2016-04-23 MED ORDER — ZOLPIDEM TARTRATE 5 MG PO TABS
5.0000 mg | ORAL_TABLET | Freq: Every evening | ORAL | Status: DC | PRN
  Administered 2016-04-23 (×2): 5 mg via ORAL
  Filled 2016-04-23 (×2): qty 1

## 2016-04-23 MED ORDER — LEVOFLOXACIN IN D5W 500 MG/100ML IV SOLN
500.0000 mg | INTRAVENOUS | Status: DC
  Administered 2016-04-23 – 2016-04-24 (×2): 500 mg via INTRAVENOUS
  Filled 2016-04-23 (×4): qty 100

## 2016-04-23 MED ORDER — HEPARIN (PORCINE) IN NACL 100-0.45 UNIT/ML-% IJ SOLN
1700.0000 [IU]/h | INTRAMUSCULAR | Status: DC
  Administered 2016-04-23: 1700 [IU]/h via INTRAVENOUS
  Filled 2016-04-23: qty 250

## 2016-04-23 MED ORDER — RIVAROXABAN 20 MG PO TABS
20.0000 mg | ORAL_TABLET | Freq: Every day | ORAL | Status: DC
  Administered 2016-04-23 – 2016-04-24 (×2): 20 mg via ORAL
  Filled 2016-04-23 (×2): qty 1

## 2016-04-23 MED ORDER — AMIODARONE HCL 200 MG PO TABS
200.0000 mg | ORAL_TABLET | Freq: Every day | ORAL | Status: DC
  Administered 2016-04-23 – 2016-04-24 (×2): 200 mg via ORAL
  Filled 2016-04-23 (×2): qty 1

## 2016-04-23 MED ORDER — IOPAMIDOL (ISOVUE-370) INJECTION 76%
INTRAVENOUS | Status: AC
  Administered 2016-04-23: 100 mL
  Filled 2016-04-23: qty 100

## 2016-04-23 MED ORDER — OXYCODONE HCL 5 MG PO TABS
5.0000 mg | ORAL_TABLET | Freq: Four times a day (QID) | ORAL | Status: DC | PRN
  Administered 2016-04-23 – 2016-04-24 (×4): 10 mg via ORAL
  Filled 2016-04-23 (×4): qty 2

## 2016-04-23 MED ORDER — ACETAMINOPHEN 325 MG PO TABS
650.0000 mg | ORAL_TABLET | Freq: Four times a day (QID) | ORAL | Status: DC | PRN
  Filled 2016-04-23: qty 2

## 2016-04-23 MED ORDER — SODIUM CHLORIDE 0.9 % IV SOLN: INTRAVENOUS | Status: DC

## 2016-04-23 MED ORDER — ONDANSETRON HCL 4 MG/2ML IJ SOLN: 4.0000 mg | Freq: Four times a day (QID) | INTRAMUSCULAR | Status: DC | PRN

## 2016-04-23 MED ORDER — SERTRALINE HCL 50 MG PO TABS
150.0000 mg | ORAL_TABLET | Freq: Every day | ORAL | Status: DC
  Administered 2016-04-23 – 2016-04-24 (×2): 150 mg via ORAL
  Filled 2016-04-23 (×2): qty 1

## 2016-04-23 NOTE — Progress Notes (Signed)
Placed CPAP in room per order, set up @ 8 cmh20, humidity filled, no oxygen placed per home settings, trial done by pt., able to place on/off by self, RT to monitor.

## 2016-04-23 NOTE — Progress Notes (Signed)
ANTICOAGULATION CONSULT NOTE - Initial Consult  Pharmacy Consult for heparin Indication: atrial fibrillation  Allergies  Allergen Reactions  . Penicillins Rash    Has patient had a PCN reaction causing immediate rash, facial/tongue/throat swelling, SOB or lightheadedness with hypotension: Yes Has patient had a PCN reaction causing severe rash involving mucus membranes or skin necrosis: No Has patient had a PCN reaction that required hospitalization No Has patient had a PCN reaction occurring within the last 10 years: No If all of the above answers are "NO", then may proceed with Cephalosporin use.     Patient Measurements: Height: 6\' 2"  (188 cm) Weight: 290 lb 9.1 oz (131.8 kg) IBW/kg (Calculated) : 82.2 Heparin Dosing Weight: 110kg  Vital Signs: Temp: 98.3 F (36.8 C) (10/24 0021) Temp Source: Oral (10/24 0021) BP: 105/65 (10/24 0000) Pulse Rate: 84 (10/24 0000)  Labs:  Recent Labs  04/22/16 1956  HGB 8.2*  HCT 24.8*  PLT 122*  CREATININE 0.90    Estimated Creatinine Clearance: 130.6 mL/min (by C-G formula based on SCr of 0.9 mg/dL).   Medical History: Past Medical History:  Diagnosis Date  . CHF (congestive heart failure) (HCC)   . Chronic combined systolic and diastolic CHF (congestive heart failure) (HCC)   . Cirrhosis (HCC)    liver scarring on biopsy, presumed to be due to methotrexate  . Depression   . DJD (degenerative joint disease)   . Essential hypertension 08/30/2013  . GERD (gastroesophageal reflux disease)    not present  . Gout   . Hives   . Hypercholesteremia   . Hypertension   . Mitral regurgitation - type I dysfunction - mild to moderate   . Obesity   . OSA (obstructive sleep apnea)    mild, uses CPAP  . Paroxysmal atrial fibrillation (HCC)   . Persistent atrial fibrillation (HCC) 03/02/2012  . Psoriasis   . S/P Minimally invasive maze operation for atrial fibrillation 03/20/2016   Complete bilateral atrial lesion set using cryothermy  and bipolar radiofrequency ablation via right mini thoracotomy approach with oversewing of LA appendage  . Shortness of breath dyspnea    exertion  . Syncope 02/21/2014  . Thrombocytopenia (HCC)   . TIA (transient ischemic attack)     Medications:  Prescriptions Prior to Admission  Medication Sig Dispense Refill Last Dose  . amiodarone (PACERONE) 200 MG tablet TAKE 1 TABLET DAILY (Patient taking differently: Take 200 mg by mouth once a day) 180 tablet 3 04/22/2016 at 1430  . carvedilol (COREG) 3.125 MG tablet Take 3.125 mg by mouth 2 (two) times daily with a meal.    04/22/2016 at 1400  . ibuprofen (ADVIL,MOTRIN) 200 MG tablet Take 400-800 mg by mouth every 6 (six) hours as needed for headache (or pain).   Past Week at Unknown time  . lisinopril (PRINIVIL,ZESTRIL) 20 MG tablet Take 1 tablet (20 mg total) by mouth daily. Talk 1/2 tablet (10 mg) at night. (Patient taking differently: Take 20 mg by mouth 2 (two) times daily. ) 45 tablet 11 04/22/2016 at 1400  . oxyCODONE (OXY IR/ROXICODONE) 5 MG immediate release tablet Take 1-2 tablets (5-10 mg total) by mouth every 6 (six) hours as needed for severe pain. 30 tablet 0 Past Week at Unknown time  . oxymetazoline (AFRIN) 0.05 % nasal spray Place 2 sprays into both nostrils 2 (two) times daily as needed for congestion.   Past Month at Unknown time  . rivaroxaban (XARELTO) 20 MG TABS tablet Take 1 tablet (20 mg total)  by mouth daily with supper. 30 tablet 1 04/21/2016 at pm  . sertraline (ZOLOFT) 100 MG tablet Take 150 mg by mouth every morning.    04/22/2016 at 1300  . simvastatin (ZOCOR) 20 MG tablet TAKE 1 TABLET DAILY (Patient taking differently: Take 20 mg by mouth once a day) 90 tablet 3 04/21/2016 at pm  . zolpidem (AMBIEN) 5 MG tablet Take 1 tablet (5 mg total) by mouth at bedtime as needed for sleep. 15 tablet 0 Past Month at Unknown time   Scheduled:  . amiodarone  200 mg Oral Daily  . sertraline  150 mg Oral Daily  . simvastatin  20 mg  Oral q1800   Infusions:  . sodium chloride      Assessment: 55yo male presents w/ syncope and CP, [facility] for further w/u of hypotension, to transition from Xarelto to heparin; last Xarelto dose was taken 10/22 at supper time; of note pt had maze procedure 72mo ago.  Goal of Therapy:  Heparin level 0.3-0.7 units/ml aPTT 66-102 seconds Monitor platelets by anticoagulation protocol: Yes   Plan:  Will begin heparin gtt at 1700 units/hr and monitor heparin levels, aPTT (while Xarelto affects anti-Xa), and CBC.  Vernard GamblesVeronda [facility] [employer], PharmD, BCPS  04/23/2016,1:49 AM

## 2016-04-23 NOTE — Progress Notes (Signed)
Pt arrived to 5W01 at 8:48PM. Pt is A&O x 4. Telemetry box 20 applied to patient and second verified by [place] J, Charity fundraiser. Skin assessment completed and second verified by Colin Broach., RN. Vital signs stable. Will continue to monitor and treat per MD orders.   04/23/16 2048  Vitals  Temp 100 F (37.8 C)  Temp Source Oral  BP 112/71  MAP (mmHg) 81  BP Location Left Arm  BP Method Automatic  Patient Position (if appropriate) Sitting  Pulse Rate (!) 102  Pulse Rate Source Monitor  Resp 20  Oxygen Therapy  SpO2 98 %  O2 Device Room Air  Height and Weight  Height 6\' 2"  (1.88 m)  Weight 132.6 kg (292 lb 4.8 oz)  Type of Scale Used Standing  BSA (Calculated - sq m) 2.63 sq meters  BMI (Calculated) 37.6  Weight in (lb) to have BMI = 25 194.3

## 2016-04-23 NOTE — Progress Notes (Addendum)
ANTICOAGULATION CONSULT NOTE  Pharmacy Consult for heparin Indication: atrial fibrillation  Allergies  Allergen Reactions  . Penicillins Rash    Has patient had a PCN reaction causing immediate rash, facial/tongue/throat swelling, SOB or lightheadedness with hypotension: Yes Has patient had a PCN reaction causing severe rash involving mucus membranes or skin necrosis: No Has patient had a PCN reaction that required hospitalization No Has patient had a PCN reaction occurring within the last 10 years: No If all of the above answers are "NO", then may proceed with Cephalosporin use.     Patient Measurements: Height: 6\' 2"  (188 cm) Weight: 290 lb 9.1 oz (131.8 kg) IBW/kg (Calculated) : 82.2 Heparin Dosing Weight: 110kg  Vital Signs: Temp: 98.4 F (36.9 C) (10/24 0340) Temp Source: Oral (10/24 0340) BP: 110/80 (10/24 0800) Pulse Rate: 85 (10/24 0800)  Labs:  Recent Labs  04/22/16 1956 04/23/16 0157 04/23/16 0910  HGB 8.2* 10.4* 10.7*  HCT 24.8* 31.5* 33.6*  PLT 122* 153 156  APTT  --   --  49*  HEPARINUNFRC  --   --  0.39  CREATININE 0.90 1.16  --   TROPONINI  --  <0.03 <0.03    Estimated Creatinine Clearance: 101.4 mL/min (by C-G formula based on SCr of 1.16 mg/dL).   Scheduled:  . amiodarone  200 mg Oral Daily  . levofloxacin (LEVAQUIN) IV  500 mg Intravenous Q24H  . sertraline  150 mg Oral Daily  . simvastatin  20 mg Oral q1800   Infusions:  . sodium chloride 75 mL/hr at 04/23/16 0721  . heparin 1,700 Units/hr (04/23/16 0220)    Assessment: 55yo male presents w/ syncope and CP, had syncopal event w/ vomiting at [facility], admitted for further w/u of hypotension, to transition from Xarelto to heparin; last Xarelto dose was taken 10/22 at supper time; of note pt had maze procedure 85mo ago. Plan noted to continue heparin until echo and CT readings are available.  -Initial heparin level= 0.39 and aPTT= 40 -With current renal function and timing of last Xarelto dose  the heparin levl may be more reliable   Goal of Therapy:  Heparin level 0.3-0.7 units/ml aPTT 66-102 seconds Monitor platelets by anticoagulation protocol: Yes   Plan:  -No heparin changes -Recheck a heparin level and aPTT in 6 hrs  Harland German, Pharm D 04/23/2016 10:28 AM  Addendum -Pharmacy consulted to resume Xarelto  Plan -restart Xarelto 20mg  po daily at dinner and discontinue heparin   Harland German, Pharm D 04/23/2016 11:53 AM

## 2016-04-23 NOTE — H&P (Signed)
History and Physical    Joseph Garrett ZOX:[id number] DOB: June 03, 1961 DOA: 04/22/2016  PCP: Roxanne Mins, PA-C  Patient coming from: Home.  Chief Complaint: Weakness and loss of consciousness.  HPI: Joseph Garrett is a 55 y.o. male with recent Maze procedure in September last month for atrial fibrillation was brought to the ER after patient had a syncopal episode in his primary care office. Patient states over the last 3 days he's been feeling weak and fatigued and also has been having some neck pain radiating to his chest. The pain is pleuritic in nature with no associated cough fever or chills. In the primary care office patient felt weak and had a syncopal episode following which patient had one episode of nausea vomiting. Denies any blood in the vomitus or stools. In the ER lab work show hypocalcemia and a hemoglobin drop of 2 g. Patient also was hypotensive with a systolic blood pressure in the 80s. Patient was given fluid bolus following with patient's blood pressure improved more than 100 systolic. On my exam patient is presently nontoxic appearing and still has some pleuritic table chest pain. There is no obvious neck mass or neck rigidity.   ED Course: Patient was given fluid bolus. Labs revealed hypocalcemia corrected calcium for albumin is 7. EKG did not show any hypocalcemia changes.  Review of Systems: As per HPI, rest all negative.   Past Medical History:  Diagnosis Date  . CHF (congestive heart failure) (HCC)   . Chronic combined systolic and diastolic CHF (congestive heart failure) (HCC)   . Cirrhosis (HCC)    liver scarring on biopsy, presumed to be due to methotrexate  . Depression   . DJD (degenerative joint disease)   . Essential hypertension 08/30/2013  . GERD (gastroesophageal reflux disease)    not present  . Gout   . Hives   . Hypercholesteremia   . Hypertension   . Mitral regurgitation - type I dysfunction - mild to moderate   . Obesity   . OSA  (obstructive sleep apnea)    mild, uses CPAP  . Paroxysmal atrial fibrillation (HCC)   . Persistent atrial fibrillation (HCC) 03/02/2012  . Psoriasis   . S/P Minimally invasive maze operation for atrial fibrillation 03/20/2016   Complete bilateral atrial lesion set using cryothermy and bipolar radiofrequency ablation via right mini thoracotomy approach with oversewing of LA appendage  . Shortness of breath dyspnea    exertion  . Syncope 02/21/2014  . Thrombocytopenia (HCC)   . TIA (transient ischemic attack)     Past Surgical History:  Procedure Laterality Date  . ABLATION OF DYSRHYTHMIC FOCUS  09/17/2012  . ATRIAL FIBRILLATION ABLATION  03/17/12   PVI and CTI ablation by Dr [name]  . ATRIAL FIBRILLATION ABLATION N/A 03/17/2012   Procedure: ATRIAL FIBRILLATION ABLATION;  Surgeon: Hillis Range, MD;  Location: [facility] CATH LAB;  Service: Cardiovascular;  Laterality: N/A;  . ATRIAL FIBRILLATION ABLATION N/A 09/18/2012   Procedure: ATRIAL FIBRILLATION ABLATION;  Surgeon: Hillis Range, MD;  Location: [facility] CATH LAB;  Service: Cardiovascular;  Laterality: N/A;  . CARDIAC CATHETERIZATION N/A 01/25/2016   Procedure: Right/Left Heart Cath and Coronary Angiography;  Surgeon: Laurey Morale, MD;  Location: [facility] INVASIVE CV LAB;  Service: Cardiovascular;  Laterality: N/A;  . CARDIOVERSION N/A 10/09/2012   Procedure: CARDIOVERSION;  Surgeon: Laurey Morale, MD;  Location: [facility] ENDOSCOPY;  Service: Cardiovascular;  Laterality: N/A;  . CARDIOVERSION N/A 02/22/2014   Procedure: CARDIOVERSION;  Surgeon: Quintella Reichert,  MD;  Location: [facility] ENDOSCOPY;  Service: Cardiovascular;  Laterality: N/A;  . CARDIOVERSION N/A 12/29/2015   Procedure: CARDIOVERSION;  Surgeon: Hillis Range, MD;  Location: [facility] OR;  Service: Cardiovascular;  Laterality: N/A;  . CHOLECYSTECTOMY  2011  . ELBOW ARTHROSCOPY Left 08/2014  . ELECTROPHYSIOLOGIC STUDY N/A 12/29/2015   Procedure: Cardioversion;  Surgeon: Hillis Range, MD;  Location: [facility] INVASIVE CV  LAB;  Service: Cardiovascular;  Laterality: N/A;  . EYE SURGERY Bilateral 04/2015   cataract surgery  . KNEE ARTHROSCOPY Right   . MINIMALLY INVASIVE MAZE PROCEDURE N/A 03/20/2016   Procedure: MINIMALLY INVASIVE MAZE PROCEDURE;  Surgeon: Purcell Nails, MD;  Location: [facility] OR;  Service: Open Heart Surgery;  Laterality: N/A;  . NOSE SURGERY     Turbinates  . RECONSTRUCTION OF NOSE    . TEE WITHOUT CARDIOVERSION  03/16/2012   Procedure: TRANSESOPHAGEAL ECHOCARDIOGRAM (TEE);  Surgeon: [name], MD;  Location: [facility] ENDOSCOPY;  Service: Cardiovascular;  Laterality: N/A;  . TEE WITHOUT CARDIOVERSION N/A 09/17/2012   Procedure: TRANSESOPHAGEAL ECHOCARDIOGRAM (TEE);  Surgeon: Lewayne Bunting, MD;  Location: [facility] ENDOSCOPY;  Service: Cardiovascular;  Laterality: N/A;  . TEE WITHOUT CARDIOVERSION N/A 01/25/2016   Procedure: TRANSESOPHAGEAL ECHOCARDIOGRAM (TEE);  Surgeon: Laurey Morale, MD;  Location: [facility] ENDOSCOPY;  Service: Cardiovascular;  Laterality: N/A;  . TEE WITHOUT CARDIOVERSION N/A 03/20/2016   Procedure: TRANSESOPHAGEAL ECHOCARDIOGRAM (TEE);  Surgeon: Purcell Nails, MD;  Location: [facility] OR;  Service: Open Heart Surgery;  Laterality: N/A;  . TENDON REPAIR     Right Forearm  . TENDON REPAIR Right 1990's   forearm     reports that he has never smoked. He has never used smokeless tobacco. He reports that he does not drink alcohol or use drugs.  Allergies  Allergen Reactions  . Penicillins Rash    Has patient had a PCN reaction causing immediate rash, facial/tongue/throat swelling, SOB or lightheadedness with hypotension: Yes Has patient had a PCN reaction causing severe rash involving mucus membranes or skin necrosis: No Has patient had a PCN reaction that required hospitalization No Has patient had a PCN reaction occurring within the last 10 years: No If all of the above answers are "NO", then may proceed with Cephalosporin use.     Family History  Problem Relation Age of Onset  . Heart  attack Father   . Heart disease    . Diabetes      Prior to Admission medications   Medication Sig Start Date End Date Taking? Authorizing Provider  amiodarone (PACERONE) 200 MG tablet TAKE 1 TABLET DAILY Patient taking differently: Take 200 mg by mouth once a day 02/01/16  Yes Hillis Range, MD  carvedilol (COREG) 3.125 MG tablet Take 3.125 mg by mouth 2 (two) times daily with a meal.    Yes Historical Provider, MD  ibuprofen (ADVIL,MOTRIN) 200 MG tablet Take 400-800 mg by mouth every 6 (six) hours as needed for headache (or pain).   Yes Historical Provider, MD  lisinopril (PRINIVIL,ZESTRIL) 20 MG tablet Take 1 tablet (20 mg total) by mouth daily. Talk 1/2 tablet (10 mg) at night. Patient taking differently: Take 20 mg by mouth 2 (two) times daily.  04/10/16  Yes Beatrice Lecher, PA-C  oxyCODONE (OXY IR/ROXICODONE) 5 MG immediate release tablet Take 1-2 tablets (5-10 mg total) by mouth every 6 (six) hours as needed for severe pain. 04/08/16  Yes Purcell Nails, MD  oxymetazoline (AFRIN) 0.05 % nasal spray Place 2 sprays into both nostrils  2 (two) times daily as needed for congestion.   Yes Historical Provider, MD  rivaroxaban (XARELTO) 20 MG TABS tablet Take 1 tablet (20 mg total) by mouth daily with supper. 03/26/16  Yes Wayne E Gold, PA-C  sertraline (ZOLOFT) 100 MG tablet Take 150 mg by mouth every morning.    Yes Historical Provider, MD  simvastatin (ZOCOR) 20 MG tablet TAKE 1 TABLET DAILY Patient taking differently: Take 20 mg by mouth once a day 01/29/16  Yes Hillis Range, MD  zolpidem (AMBIEN) 5 MG tablet Take 1 tablet (5 mg total) by mouth at bedtime as needed for sleep. 03/18/16 06/16/16 Yes Purcell Nails, MD    Physical Exam: Vitals:   04/22/16 2315 04/22/16 2345 04/23/16 0000 04/23/16 0021  BP: (!) 88/57 102/64 105/65   Pulse: 86 91 84   Resp: 23 26 23    Temp:    98.3 F (36.8 C)  TempSrc:    Oral  SpO2: 100% 99% 97%   Weight:    131.8 kg (290 lb 9.1 oz)  Height:    6\' 2"   (1.88 m)      Constitutional: Moderately built and nourished. Vitals:   04/22/16 2315 04/22/16 2345 04/23/16 0000 04/23/16 0021  BP: (!) 88/57 102/64 105/65   Pulse: 86 91 84   Resp: 23 26 23    Temp:    98.3 F (36.8 C)  TempSrc:    Oral  SpO2: 100% 99% 97%   Weight:    131.8 kg (290 lb 9.1 oz)  Height:    6\' 2"  (1.88 m)   Eyes: Anicteric no pallor. ENMT: No discharge from the ears eyes nose or mouth. Neck: No mass felt. No neck rigidity. No JVD appreciated. Respiratory: No rhonchi or crepitations. Cardiovascular: S1 and S2 heard. No murmurs appreciated. Abdomen: Soft nontender bowel sounds present. No guarding or rigidity. Musculoskeletal: No edema. No joint effusion. Skin: No rash. Skin appears warm. Neurologic: Alert awake oriented to time place and person. Moves all extremities 5 x 5. Psychiatric: Appears normal. Normal affect.   Labs on Admission: I have personally reviewed following labs and imaging studies  CBC:  Recent Labs Lab 04/22/16 1956  WBC 7.4  NEUTROABS 6.3  HGB 8.2*  HCT 24.8*  MCV 92.2  PLT 122*   Basic Metabolic Panel:  Recent Labs Lab 04/22/16 1956  NA 141  K 3.2*  CL 122*  CO2 16*  GLUCOSE 81  BUN 11  CREATININE 0.90  CALCIUM 5.3*   GFR: Estimated Creatinine Clearance: 130.6 mL/min (by C-G formula based on SCr of 0.9 mg/dL). Liver Function Tests:  Recent Labs Lab 04/22/16 1956  AST 13*  ALT 14*  ALKPHOS 54  BILITOT 0.7  PROT 3.4*  ALBUMIN 1.9*   No results for input(s): LIPASE, AMYLASE in the last 168 hours. No results for input(s): AMMONIA in the last 168 hours. Coagulation Profile: No results for input(s): INR, PROTIME in the last 168 hours. Cardiac Enzymes: No results for input(s): CKTOTAL, CKMB, CKMBINDEX, TROPONINI in the last 168 hours. BNP (last 3 results) No results for input(s): PROBNP in the last 8760 hours. HbA1C: No results for input(s): HGBA1C in the last 72 hours. CBG: No results for input(s): GLUCAP  in the last 168 hours. Lipid Profile: No results for input(s): CHOL, HDL, LDLCALC, TRIG, CHOLHDL, LDLDIRECT in the last 72 hours. Thyroid Function Tests: No results for input(s): TSH, T4TOTAL, FREET4, T3FREE, THYROIDAB in the last 72 hours. Anemia Panel: No results for input(s): VITAMINB12,  FOLATE, FERRITIN, TIBC, IRON, RETICCTPCT in the last 72 hours. Urine analysis:    Component Value Date/Time   COLORURINE AMBER (A) 04/22/2016 0018   APPEARANCEUR CLOUDY (A) 04/22/2016 0018   LABSPEC 1.017 04/22/2016 0018   PHURINE 6.5 04/22/2016 0018   GLUCOSEU NEGATIVE 04/22/2016 0018   HGBUR NEGATIVE 04/22/2016 0018   BILIRUBINUR SMALL (A) 04/22/2016 0018   KETONESUR NEGATIVE 04/22/2016 0018   PROTEINUR NEGATIVE 04/22/2016 0018   NITRITE NEGATIVE 04/22/2016 0018   LEUKOCYTESUR NEGATIVE 04/22/2016 0018   Sepsis Labs: @LABRCNTIP (procalcitonin:4,lacticidven:4) )No results found for this or any previous visit (from the past 240 hour(s)).   Radiological Exams on Admission: Dg Chest 2 View  Result Date: 04/22/2016 CLINICAL DATA:  Syncope and chest pain. EXAM: CHEST  2 VIEW COMPARISON:  04/08/2016 and 03/29/2016. FINDINGS: The heart size and mediastinal contours are stable. There is stable elevation of the right hemidiaphragm with adjacent pleural thickening and mild right middle lobe atelectasis. The left lung is clear. There is no evidence of pleural effusion or pneumothorax. The bones appear unchanged. IMPRESSION: Stable right basilar atelectasis and pleural thickening. No acute findings. Electronically Signed   By: Carey BullocksWilliam  Veazey M.D.   On: 04/22/2016 20:35   Ct Head Wo Contrast  Result Date: 04/22/2016 CLINICAL DATA:  Blurred vision, dizziness, nausea and vomiting following head injury 2 days ago. Initial encounter. EXAM: CT HEAD WITHOUT CONTRAST TECHNIQUE: Contiguous axial images were obtained from the base of the skull through the vertex without intravenous contrast. COMPARISON:  None.  FINDINGS: Brain: There is no evidence of acute intracranial hemorrhage, mass lesion, brain edema or extra-axial fluid collection. The ventricles and subarachnoid spaces are appropriately sized for age. There is no CT evidence of acute cortical infarction. Vascular: No hyperdense vessel or unexpected calcification. There is a small amount of air within the cavernous sinuses bilaterally. There is also a small amount of air posteriorly in the right orbit and within the left temporal scalp veins. This is likely incidental, related to the presence of an IV catheter. Skull: Negative for fracture or focal lesion. Sinuses/Orbits: The visualized paranasal sinuses and mastoid air cells are clear. No other orbital abnormalities are seen. Other: None. IMPRESSION: 1. No acute intracranial findings. 2. Probable incidental intravascular air as described, likely related to an IV line. Electronically Signed   By: Carey BullocksWilliam  Veazey M.D.   On: 04/22/2016 20:49    EKG: Independently reviewed. Normal sinus rhythm.  Assessment/Plan Principal Problem:   Hypotension Active Problems:   Sleep apnea   Persistent atrial fibrillation (HCC)   NICM (nonischemic cardiomyopathy) (HCC)   Essential hypertension   Syncope   S/P Minimally invasive maze operation for atrial fibrillation    1. Hypotension and syncope - cause not clear. Continue with hydration and hold antihypertensives. Patient does not look septic. Check 2-D echo for any pericardial effusion and also since patient has pleuritic type of chest pain will check CT angiogram of the chest. Cycle cardiac markers. 2. Pleuritic type of chest pain - check CT angiogram of her chest and cycle cardiac markers. 3. Hypocalcemia - corrected calcium is around 7. I have repeated a stat metabolic panel. If hypocalcemia persists will correct. Cause of hypocalcemia not clear. 4. Atrial fibrillation status post recent maze procedure - on amiodarone. Holding off Coreg secondary to  hypotension. Patient is on heparin for now until we get 2-D echo and CT readings. 5. Nonischemic cardiomyopathy last EF measured in 2015 was 45-50% and has had a normal cardiac cath in  July of this year with EF around 50-55% - presently hypovolemic. 6. Anemia - hemoglobin drops less than 7 then will need transfusion. Hemoccult was negative. Check anemia panel. 7. Hypertension - poorly antihypertensives secondary to #1. 8. Thrombocytopenia - patient may be developing liver disease. Patient states he has had fatty liver from previous intake of methotrexate.   DVT prophylaxis: Heparin. Code Status: Full code.  Family Communication: Discussed with patient and wife.  Disposition Plan: Home.  Consults called: None.  Admission status: Observation.    Eduard Clos MD [facility] Pager [phone].  If 7PM-7AM, please contact night-coverage [email or web address] Password [email or web address]  04/23/2016, 1:43 AM

## 2016-04-23 NOTE — Progress Notes (Addendum)
Pt seen and examined, patient admitted earlier this am by DR.Toniann Fail 55/M with Chronic systolic CHF EF 45%, HTN, H/o AFib s/p MAZE procedure in September on Xarelto admitted with Hypotension and syncope. CTA chest suspects RML pneumonia and cardiomegaly with small pericardial effusion. BP improved, patient feels well without complaints  Hb was 8 on admission but repeat is in > 10 range so suspect 8 was a lab error, hemoccult negative Continue IVF, Start Levaquin for CAP STop IV heparin and resume Xarelto Repeat 2D ECHO TSH low check T4 Resume Coreg tomorrow and likely hold ACE at discharge  Zannie Cove, MD

## 2016-04-24 ENCOUNTER — Inpatient Hospital Stay (HOSPITAL_COMMUNITY): Payer: BLUE CROSS/BLUE SHIELD

## 2016-04-24 DIAGNOSIS — I959 Hypotension, unspecified: Secondary | ICD-10-CM | POA: Diagnosis not present

## 2016-04-24 DIAGNOSIS — R079 Chest pain, unspecified: Secondary | ICD-10-CM

## 2016-04-24 LAB — URINE CULTURE: CULTURE: NO GROWTH

## 2016-04-24 LAB — ECHOCARDIOGRAM COMPLETE
Height: 74 in
Weight: 4676.8 oz

## 2016-04-24 LAB — CALCIUM, IONIZED: CALCIUM, IONIZED, SERUM: 4.7 mg/dL (ref 4.5–5.6)

## 2016-04-24 MED ORDER — MUPIROCIN 2 % EX OINT: TOPICAL_OINTMENT | Freq: Two times a day (BID) | CUTANEOUS | Status: DC

## 2016-04-24 MED ORDER — CHLORHEXIDINE GLUCONATE CLOTH 2 % EX PADS: 6.0000 | MEDICATED_PAD | Freq: Every day | CUTANEOUS | Status: DC

## 2016-04-24 MED ORDER — CARVEDILOL 3.125 MG PO TABS: 3.1250 mg | ORAL_TABLET | Freq: Two times a day (BID) | ORAL | Status: DC

## 2016-04-24 MED ORDER — LEVOFLOXACIN 500 MG PO TABS: 500.0000 mg | ORAL_TABLET | Freq: Every day | ORAL | 0 refills | Status: AC

## 2016-04-24 MED ORDER — LEVOFLOXACIN 500 MG PO TABS: 500.0000 mg | ORAL_TABLET | Freq: Every day | ORAL | Status: DC

## 2016-04-24 NOTE — Care Management Note (Signed)
Case Management Note  Patient Details  Name: [name] MRN: [id number] Date of Birth: [birth year]  Subjective/Objective:                 Spoke with patient and wife in room. Patient independent, CM observed him walking from bathroom to bed unassisted. Patient uses CPAP at home, but denies other DME or DME needs. No CM needs identified.   Action/Plan:  DC to home with wife, self care.   Expected Discharge Date:                  Expected Discharge Plan:  Home/Self Care  In-House Referral:  NA  Discharge planning Services  CM Consult  Post Acute Care Choice:  NA Choice offered to:  Patient, Spouse  DME Arranged:  N/A DME Agency:  NA  HH Arranged:  NA HH Agency:  NA  Status of Service:  Completed, signed off  If discussed at Long Length of Stay Meetings, dates discussed:    Additional Comments:  Lawerance Sabal, RN 04/24/2016, 11:47 AM

## 2016-04-24 NOTE — Discharge Instructions (Signed)
Hold your coreg for 2 days, check your blood pressure daily, if it remains below 140/90 then do not take coreg, if it remain above 140/90 resume coreg. Regardless follow up with Roxanne Mins, PA-C in 1 week for blood pressure check.

## 2016-04-24 NOTE — Evaluation (Signed)
Physical Therapy Evaluation and Discharge Patient Details Name: Joseph GrimesKenneth J Garrett MRN: [id number] DOB: [birth year] Today's Date: 04/24/2016   History of Present Illness  55 y.o. male with recent Maze procedure in September last month for atrial fibrillation was brought to the ER after patient had a syncopal episode in his primary care office. Patient states over the last 3 days he's been feeling weak and fatigued and also has been having some neck pain radiating to his chest.     Clinical Impression  Patient evaluated by Physical Therapy with no further PT needs identified. Pt asymptomatic with gait and balance testing (all WNL). HR 92-102 bpm.  PT is signing off. Thank you for this referral.     Follow Up Recommendations No PT follow up    Equipment Recommendations  None recommended by PT    Recommendations for Other Services       Precautions / Restrictions Precautions Precautions: None      Mobility  Bed Mobility Overal bed mobility: Independent                Transfers Overall transfer level: Independent                  Ambulation/Gait Ambulation/Gait assistance: Independent Ambulation Distance (Feet): 250 Feet Assistive device: None Gait Pattern/deviations: WFL(Within Functional Limits) Gait velocity: able to vary appropriately Gait velocity interpretation: at or above normal speed for age/gender General Gait Details: no deviations  Stairs Stairs:  (deferred due to contact isolation and no difficulty with gai)          Wheelchair Mobility    Modified Rankin (Stroke Patients Only)       Balance Overall balance assessment: Independent (Rhomberg eye open/closed >30 sec)                               Standardized Balance Assessment Standardized Balance Assessment : Dynamic Gait Index   Dynamic Gait Index Level Surface: Normal Change in Gait Speed: Normal Gait with Horizontal Head Turns: Normal Gait with Vertical Head  Turns: Normal Gait and Pivot Turn: Normal Step Over Obstacle: Normal Step Around Obstacles: Normal       Pertinent Vitals/Pain Pain Assessment: No/denies pain    Home Living Family/patient expects to be discharged to:: Private residence Living Arrangements: Spouse/significant other Available Help at Discharge: Family;Available 24 hours/day Type of Home: House Home Access: Stairs to enter   [employer] CorporationEntrance Stairs-Number of Steps: a few Home Layout: Multi-level (split level) Home Equipment: None Additional Comments: pt irritated by questioning with vague answers    Prior Function Level of Independence: Independent               Hand Dominance        Extremity/Trunk Assessment   Upper Extremity Assessment: Overall WFL for tasks assessed           Lower Extremity Assessment: Overall WFL for tasks assessed      Cervical / Trunk Assessment: Normal  Communication   Communication: No difficulties  Cognition Arousal/Alertness: Awake/alert Behavior During Therapy: WFL for tasks assessed/performed Overall Cognitive Status: Within Functional Limits for tasks assessed                      General Comments General comments (skin integrity, edema, etc.): wife and sister present    Exercises     Assessment/Plan    PT Assessment Patent does not need any further PT services  PT Problem List            PT Treatment Interventions      PT Goals (Current goals can be found in the Care Plan section)  Acute Rehab PT Goals PT Goal Formulation: All assessment and education complete, DC therapy    Frequency     Barriers to discharge        Co-evaluation               End of Session   Activity Tolerance: Patient tolerated treatment well Patient left: in bed;with call bell/phone within reach;with family/visitor present Nurse Communication: Mobility status (no PT needs)         Time: 1025-1036 PT Time Calculation (min) (ACUTE ONLY): 11  min   Charges:   PT Evaluation $PT Eval Low Complexity: 1 Procedure     PT G Codes:        Joseph Garrett 2016/05/03, 11:02 AM Pager [phone]

## 2016-04-27 LAB — CULTURE, BLOOD (ROUTINE X 2)
CULTURE: NO GROWTH
Culture: NO GROWTH

## 2016-04-29 NOTE — Discharge Summary (Signed)
[facility] Discharge Summary   Patient: Joseph Garrett HLK:[id number]   PCP: Roxanne Mins, PA-C DOB: [birth year]   Date of admission: 04/22/2016   Date of discharge: 04/24/2016     Discharge Diagnoses:  Principal Problem:   Hypotension Active Problems:   Sleep apnea   Persistent atrial fibrillation (HCC)   NICM (nonischemic cardiomyopathy) (HCC)   Essential hypertension   Syncope   S/P Minimally invasive maze operation for atrial fibrillation   Admitted From: Home Disposition:  Home  Recommendations for Outpatient Follow-up:  1. Follow-up with PCP in one week 2. Follow-up with cardiology as recommended   Follow-up Information    Roxanne Mins, PA-C. Schedule an appointment as soon as possible for a visit in 1 week(s).   Specialty:  Cardiology Contact information: [facility]  [address] [place] [address] [phone]        Hillis Range, MD. Schedule an appointment as soon as possible for a visit in 1 month(s).   Specialty:  Cardiology Contact information: [address] [place] [address] [phone]          Diet recommendation: Cardiac diet  Activity: The patient is advised to gradually reintroduce usual activities.  Discharge Condition: good  Code Status: Full code  History of present illness: As per the H and P dictated on admission, " Joseph Garrett is a 55 y.o. male with recent Maze procedure in September last month for atrial fibrillation was brought to the ER after patient had a syncopal episode in his primary care office. Patient states over the last 3 days he's been feeling weak and fatigued and also has been having some neck pain radiating to his chest. The pain is pleuritic in nature with no associated cough fever or chills. In the primary care office patient felt weak and had a syncopal episode following which patient had one episode of nausea vomiting. Denies any blood in the vomitus or stools. In  the ER lab work show hypocalcemia and a hemoglobin drop of 2 g. Patient also was hypotensive with a systolic blood pressure in the 80s. Patient was given fluid bolus following with patient's blood pressure improved more than 100 systolic. On my exam patient is presently nontoxic appearing and still has some pleuritic table chest pain. There is no obvious neck mass or neck rigidity. "  Hospital Course:   Summary of his active problems in the hospital is as following. 1. Hypotension and syncope - Patient does not look septic.  Negative cardiac enzyme, negative repeat orthostatic vitals, negative CTA for pulmonary embolism or any acute intrathoracic abnormality. Echocardiogram was only showing pulmonary hypertension and no other valvular abnormality. There is a small pericardial effusion which is consistent with patient's history of CABG. Discussed with cardiology who recommended no further workup and recommended close cardiology follow-up. PT recommended no further evaluation on discharge.  2. Pleuritic type of chest pain - negative CT angiogram of her chest for PE and normal cardiac markers. 3. Hypocalcemia - resolved 4. Atrial fibrillation status post recent maze procedure - on amiodarone. Patient is unwilling to stop Coreg But I recommend to stop it secondary to hypotension.  5. Nonischemic cardiomyopathy last EF measured in 2015 was 45-50%  Currently echogram shows improved EF.  6. Anemia - hemoglobin drops less than 7 then will need transfusion. Hemoccult was negative. Check anemia panel. 7. Hypertension - continue Coreg, discontinue lisinopril due to hypotension. 8. Thrombocytopenia - patient may be developing liver disease.  Patient states he has had fatty liver from previous intake of methotrexate.  All other chronic medical condition were stable during the hospitalization.  Patient was seen by physical therapy, who recommended no further PT follow-up required On the day of the  discharge the patient's vitals were stable, and no other acute medical condition were reported by patient. the patient was felt safe to be discharge at home with family.  Procedures and Results:  Echocardiogram Study Conclusions  - Left ventricle: LV longitudinal lV strain is abnormal at -12%.   The cavity size was normal. Systolic function was normal. The   estimated ejection fraction was in the range of 50% to 55%.   Diffuse hypokinesis. - Aortic valve: Trileaflet; mildly thickened leaflets. - Right ventricle: Systolic function was moderately reduced. - Pulmonary arteries: PA peak pressure: 51 mm Hg (S). - Pericardium, extracardiac: A small, free-flowing pericardial   effusion was identified along the right ventricular free wall.   The fluid had no internal echoes.There was no evidence of   hemodynamic compromise.    Consultations:  none  DISCHARGE MEDICATION: Discharge Medication List as of 04/24/2016  5:08 PM    START taking these medications   Details  levofloxacin (LEVAQUIN) 500 MG tablet Take 1 tablet (500 mg total) by mouth daily., Starting Thu 04/25/2016, Until Sun 04/28/2016, Normal      CONTINUE these medications which have CHANGED   Details  carvedilol (COREG) 3.125 MG tablet Take 1 tablet (3.125 mg total) by mouth 2 (two) times daily with a meal. Resume when BP remain above 140/90., Starting Tue 04/30/2016, No Print      CONTINUE these medications which have NOT CHANGED   Details  amiodarone (PACERONE) 200 MG tablet TAKE 1 TABLET DAILY, Normal    ibuprofen (ADVIL,MOTRIN) 200 MG tablet Take 400-800 mg by mouth every 6 (six) hours as needed for headache (or pain)., Historical Med    oxyCODONE (OXY IR/ROXICODONE) 5 MG immediate release tablet Take 1-2 tablets (5-10 mg total) by mouth every 6 (six) hours as needed for severe pain., Starting Mon 04/08/2016, Print    oxymetazoline (AFRIN) 0.05 % nasal spray Place 2 sprays into both nostrils 2 (two) times daily as  needed for congestion., Historical Med    rivaroxaban (XARELTO) 20 MG TABS tablet Take 1 tablet (20 mg total) by mouth daily with supper., Starting Tue 03/26/2016, Print    sertraline (ZOLOFT) 100 MG tablet Take 150 mg by mouth every morning. , Historical Med    simvastatin (ZOCOR) 20 MG tablet TAKE 1 TABLET DAILY, Normal    zolpidem (AMBIEN) 5 MG tablet Take 1 tablet (5 mg total) by mouth at bedtime as needed for sleep., Starting Mon 03/18/2016, Until Sun 06/16/2016, Phone In      STOP taking these medications     lisinopril (PRINIVIL,ZESTRIL) 20 MG tablet        Allergies  Allergen Reactions  . Penicillins Rash    Has patient had a PCN reaction causing immediate rash, facial/tongue/throat swelling, SOB or lightheadedness with hypotension: Yes Has patient had a PCN reaction causing severe rash involving mucus membranes or skin necrosis: No Has patient had a PCN reaction that required hospitalization No Has patient had a PCN reaction occurring within the last 10 years: No If all of the above answers are "NO", then may proceed with Cephalosporin use.    Discharge Instructions    Diet - low sodium heart healthy    Complete by:  As directed  Discharge instructions    Complete by:  As directed    It is important that you read following instructions as well as go over your medication list with RN to help you understand your care after this hospitalization.  Discharge Instructions: Hold your coreg for 2 days, check your blood pressure daily, if it remains below 140/90 then do not take coreg, if it remain above 140/90 resume coreg. Regardless follow up with Roxanne Mins, PA-C in 1 week for blood pressure check.   Please request your primary care physician to go over all Hospital Tests and Procedure/Radiological results at the follow up,  Please get all Hospital records sent to your PCP by signing hospital release before you go home.   Do not drive, operating heavy machinery,  perform activities at heights, swimming or participation in water activities or provide baby sitting services; until you have been seen by Primary Care Physician or a Neurologist and advised to do so again. Do not take more than prescribed Pain, Sleep and Anxiety Medications. You were cared for by a hospitalist during your hospital stay. If you have any questions about your discharge medications or the care you received while you were in the hospital after you are discharged, you can call the unit and ask to speak with the hospitalist on call if the hospitalist that took care of you is not available.  Once you are discharged, your primary care physician will handle any further medical issues. Please note that NO REFILLS for any discharge medications will be authorized once you are discharged, as it is imperative that you return to your primary care physician (or establish a relationship with a primary care physician if you do not have one) for your aftercare needs so that they can reassess your need for medications and monitor your lab values. You Must read complete instructions/literature along with all the possible adverse reactions/side effects for all the Medicines you take and that have been prescribed to you. Take any new Medicines after you have completely understood and accept all the possible adverse reactions/side effects. Wear Seat belts while driving. If you have smoked or chewed Tobacco in the last 2 yrs please stop smoking and/or stop any Recreational drug use.   Increase activity slowly    Complete by:  As directed      Discharge Exam: Filed Weights   04/22/16 1948 04/23/16 0021 04/23/16 2048  Weight: 132.9 kg (293 lb) 131.8 kg (290 lb 9.1 oz) 132.6 kg (292 lb 4.8 oz)   Vitals:   04/23/16 2217 04/24/16 0425  BP:  134/76  Pulse: 95 94  Resp: 18 20  Temp:  99.1 F (37.3 C)   General: Appear in no distress, no Rash; Oral Mucosa moist Cardiovascular: S1 and S2 Present, no Murmur,  no JVD Respiratory: Bilateral Air entry present and Clear to Auscultation, no Crackles, no wheezes Abdomen: Bowel Sound present, Soft and no tenderness Extremities: no Pedal edema, no calf tenderness Neurology: Grossly no focal neuro deficit.  The results of significant diagnostics from this hospitalization (including imaging, microbiology, ancillary and laboratory) are listed below for reference.    Significant Diagnostic Studies: Dg Chest 2 View  Result Date: 04/22/2016 CLINICAL DATA:  Syncope and chest pain. EXAM: CHEST  2 VIEW COMPARISON:  04/08/2016 and 03/29/2016. FINDINGS: The heart size and mediastinal contours are stable. There is stable elevation of the right hemidiaphragm with adjacent pleural thickening and mild right middle lobe atelectasis. The left lung is clear. There is no  evidence of pleural effusion or pneumothorax. The bones appear unchanged. IMPRESSION: Stable right basilar atelectasis and pleural thickening. No acute findings. Electronically Signed   By: Carey Bullocks M.D.   On: 04/22/2016 20:35   Dg Chest 2 View  Result Date: 04/08/2016 CLINICAL DATA:  Status post Maze procedure on March 20, 2016 ; shortness of breath EXAM: CHEST  2 VIEW COMPARISON:  PA and lateral chest x-ray of March 29, 2016 FINDINGS: There remains patchy increased density in the right lung. Small amounts of pleural fluid blunt the posterior costophrenic angles. There is no pneumothorax. The heart is normal in size. The pulmonary vascularity is not engorged. The trachea is midline. The bony thorax is unremarkable. IMPRESSION: Persistent increased density in the right mid and lower lung predominantly posteriorly unchanged since September 29th but new since March 18, 2016. This likely reflects atelectasis or subtle infiltrate. Are there clinical findings suggestive of pneumonia or CHF? Electronically Signed   By: David  [place] M.D.   On: 04/08/2016 14:05   Ct Head Wo Contrast  Result Date:  04/22/2016 CLINICAL DATA:  Blurred vision, dizziness, nausea and vomiting following head injury 2 days ago. Initial encounter. EXAM: CT HEAD WITHOUT CONTRAST TECHNIQUE: Contiguous axial images were obtained from the base of the skull through the vertex without intravenous contrast. COMPARISON:  None. FINDINGS: Brain: There is no evidence of acute intracranial hemorrhage, mass lesion, brain edema or extra-axial fluid collection. The ventricles and subarachnoid spaces are appropriately sized for age. There is no CT evidence of acute cortical infarction. Vascular: No hyperdense vessel or unexpected calcification. There is a small amount of air within the cavernous sinuses bilaterally. There is also a small amount of air posteriorly in the right orbit and within the left temporal scalp veins. This is likely incidental, related to the presence of an IV catheter. Skull: Negative for fracture or focal lesion. Sinuses/Orbits: The visualized paranasal sinuses and mastoid air cells are clear. No other orbital abnormalities are seen. Other: None. IMPRESSION: 1. No acute intracranial findings. 2. Probable incidental intravascular air as described, likely related to an IV line. Electronically Signed   By: Carey Bullocks M.D.   On: 04/22/2016 20:49   Ct Angio Chest Pe W Or Wo Contrast  Result Date: 04/23/2016 CLINICAL DATA:  55 year old male with chest pain EXAM: CT ANGIOGRAPHY CHEST WITH CONTRAST TECHNIQUE: Multidetector CT imaging of the chest was performed using the standard protocol during bolus administration of intravenous contrast. Multiplanar CT image reconstructions and MIPs were obtained to evaluate the vascular anatomy. CONTRAST:  100 cc Isovue 370 COMPARISON:  Chest radiograph dated 04/22/2016 and CT dated 02/09/2016 FINDINGS: Evaluation of this exam is limited due to respiratory motion artifact. Cardiovascular: The thoracic aorta appears unremarkable. The origins of the great vessels of the aortic arch appear  patent. Evaluation of the pulmonary arteries is limited due to respiratory motion artifact. No definite central pulmonary artery embolus identified. V/Q scan may provide better evaluation if there is high clinical concern for PE. There is moderate cardiomegaly with enlargement of the right cardiac chambers. There is retrograde flow of contrast from the right atrium into the IVC compatible with a degree of right cardiac dysfunction. Correlation with echocardiogram recommended. There is a small pericardial effusion measuring 6 mm in thickness. Mediastinum/Nodes: There is no hilar or mediastinal adenopathy. The esophagus is grossly unremarkable. No thyroid nodules identified. Lungs/Pleura: Small left pleural effusion with associated partial compressive atelectasis of the left lung base. Linear and hazy density at the  left lung base may be combination of atelectasis/ scarring or infiltrate. There is a cluster of nodularity in the right middle lobe (series 401, image 62 and coronal series 42, image 79 concerning for pneumonia. Clinical correlation is recommended. Right upper lobe linear scarring. There is no pneumothorax. The central airways are patent. Upper Abdomen: Cholecystectomy. Mild splenomegaly. The visualized upper abdomen is otherwise unremarkable. Musculoskeletal: Degenerative changes of the spine. No acute fracture. Review of the MIP images confirms the above findings. IMPRESSION: No definite CT evidence of central pulmonary artery embolus. Evaluation however is limited due to respiratory motion artifact. Cluster of nodularity in the right middle lobe concerning for pneumonia. Left lung base density may represent atelectasis versus infiltrate. Clinical correlation and follow-up recommended. Small left pleural effusion. Cardiomegaly with small pericardial effusion and evidence of right cardiac dysfunction. Correlation with echocardiogram recommended. Electronically Signed   By: Elgie Collard M.D.   On:  04/23/2016 05:39    Microbiology: Recent Results (from the past 240 hour(s))  Urine culture     Status: None   Collection Time: 04/22/16 12:18 AM  Result Value Ref Range Status   Specimen Description URINE, RANDOM  Final   Special Requests NONE  Final   Culture NO GROWTH  Final   Report Status 04/24/2016 FINAL  Final  Culture, blood (Routine x 2)     Status: None   Collection Time: 04/22/16  7:50 PM  Result Value Ref Range Status   Specimen Description BLOOD RIGHT ANTECUBITAL  Final   Special Requests BOTTLES DRAWN AEROBIC AND ANAEROBIC 5CC  Final   Culture NO GROWTH 5 DAYS  Final   Report Status 04/27/2016 FINAL  Final  Culture, blood (Routine x 2)     Status: None   Collection Time: 04/22/16  7:56 PM  Result Value Ref Range Status   Specimen Description BLOOD LEFT ANTECUBITAL  Final   Special Requests BOTTLES DRAWN AEROBIC AND ANAEROBIC 5CC  Final   Culture NO GROWTH 5 DAYS  Final   Report Status 04/27/2016 FINAL  Final  MRSA PCR Screening     Status: Abnormal   Collection Time: 04/23/16 12:24 AM  Result Value Ref Range Status   MRSA by PCR POSITIVE (A) NEGATIVE Final    Comment:        The GeneXpert MRSA Assay (FDA approved for NASAL specimens only), is one component of a comprehensive MRSA colonization surveillance program. It is not intended to diagnose MRSA infection nor to guide or monitor treatment for MRSA infections. RESULT CALLED TO, READ BACK BY AND VERIFIED WITH: C SHENCK,RN @0512  04/23/16 MKELLY,MLT      Labs: CBC:  Recent Labs Lab 04/22/16 1956 04/23/16 0157 04/23/16 0910  WBC 7.4 7.8 5.5  NEUTROABS 6.3 6.2  --   HGB 8.2* 10.4* 10.7*  HCT 24.8* 31.5* 33.6*  MCV 92.2 91.3 92.6  PLT 122* 153 156   Basic Metabolic Panel:  Recent Labs Lab 04/22/16 1956 04/23/16 0157  NA 141 136  K 3.2* 4.3  CL 122* 105  CO2 16* 25  GLUCOSE 81 143*  BUN 11 11  CREATININE 0.90 1.16  CALCIUM 5.3* 8.3*   Liver Function Tests:  Recent Labs Lab  04/22/16 1956 04/23/16 0157  AST 13* 18  ALT 14* 21  ALKPHOS 54 80  BILITOT 0.7 0.7  PROT 3.4* 5.3*  ALBUMIN 1.9* 2.8*   No results for input(s): LIPASE, AMYLASE in the last 168 hours. No results for input(s): AMMONIA in the last 168  hours. Cardiac Enzymes:  Recent Labs Lab 04/23/16 0157 04/23/16 0910 04/23/16 1348  TROPONINI <0.03 <0.03 <0.03   BNP (last 3 results) No results for input(s): BNP in the last 8760 hours. CBG: No results for input(s): GLUCAP in the last 168 hours. Time spent: 30 minutes  Signed:  Ayriana Wix  [facility] 04/24/2016 , 7:55 AM

## 2016-05-20 ENCOUNTER — Encounter: Payer: Self-pay | Admitting: Thoracic Surgery (Cardiothoracic Vascular Surgery)

## 2016-05-27 ENCOUNTER — Telehealth (HOSPITAL_COMMUNITY): Payer: Self-pay | Admitting: *Deleted

## 2016-05-27 ENCOUNTER — Ambulatory Visit (INDEPENDENT_AMBULATORY_CARE_PROVIDER_SITE_OTHER): Payer: Self-pay | Admitting: Thoracic Surgery (Cardiothoracic Vascular Surgery)

## 2016-05-27 ENCOUNTER — Encounter: Payer: Self-pay | Admitting: Thoracic Surgery (Cardiothoracic Vascular Surgery)

## 2016-05-27 VITALS — BP 132/90 | HR 79 | Resp 16 | Ht 74.0 in | Wt 294.0 lb

## 2016-05-27 DIAGNOSIS — Z9889 Other specified postprocedural states: Secondary | ICD-10-CM

## 2016-05-27 DIAGNOSIS — I4819 Other persistent atrial fibrillation: Secondary | ICD-10-CM

## 2016-05-27 DIAGNOSIS — I481 Persistent atrial fibrillation: Secondary | ICD-10-CM

## 2016-05-27 DIAGNOSIS — Z8679 Personal history of other diseases of the circulatory system: Secondary | ICD-10-CM

## 2016-05-27 NOTE — Patient Instructions (Signed)
Decrease amiodarone to 1/2 tablet (200 mg) by mouth daily  If you remain in sinus rhythm at your next visit in the [facility] you may stop taking amiodarone  You may resume unrestricted physical activity without any particular limitations at this time.

## 2016-05-27 NOTE — Telephone Encounter (Signed)
Pt has a follow up appt sched with Hillis Range, MD on 07/08/2016.  This would be 6 weeks.  Pt notified to keep appt with Allred.  We will not sched at this time

## 2016-05-27 NOTE — Progress Notes (Signed)
[address]       Joseph Garrett [address]             [phone]     CARDIOTHORACIC SURGERY OFFICE NOTE  Referring Provider is Joseph Range, MD PCP is Joseph Mins, PA-C   HPI:  Patient returns to the office today for routine follow-up approximately 2 months status post minimally invasive Maze procedure on 03/20/2016. He was last seen here in our office on 04/08/2016 at which time he was doing well. He was evaluated briefly in the emergency department the end of October with a dizzy spell and near syncopal event that was felt related to blood pressure medications. An echocardiogram was performed at that time that demonstrated some improvement in left ventricular systolic function with ejection fraction estimated 50-55%. No other significant abnormalities were noted. Since then the patient has done quite well and he returns to our office for routine follow-up. He reports that he no longer has any pain in his chest related to his surgery. He has not had any palpitations or other symptoms to suggest a recurrence of atrial fibrillation. His activity level is quite good. Overall he has no complaints.   Current Outpatient Prescriptions  Medication Sig Dispense Refill  . amiodarone (PACERONE) 200 MG tablet TAKE 1 TABLET DAILY (Patient taking differently: Take 200 mg by mouth once a day) 180 tablet 3  . carvedilol (COREG) 3.125 MG tablet Take 1 tablet (3.125 mg total) by mouth 2 (two) times daily with a meal. Resume when BP remain above 140/90.    Marland Kitchen ibuprofen (ADVIL,MOTRIN) 200 MG tablet Take 400-800 mg by mouth every 6 (six) hours as needed for headache (or pain).    Marland Kitchen oxyCODONE (OXY IR/ROXICODONE) 5 MG immediate release tablet Take 1-2 tablets (5-10 mg total) by mouth every 6 (six) hours as needed for severe pain. 30 tablet 0  . oxymetazoline (AFRIN) 0.05 % nasal spray Place 2 sprays into both nostrils 2 (two) times daily as needed for congestion.    . rivaroxaban (XARELTO)  20 MG TABS tablet Take 1 tablet (20 mg total) by mouth daily with supper. 30 tablet 1  . sertraline (ZOLOFT) 100 MG tablet Take 150 mg by mouth every morning.     . simvastatin (ZOCOR) 20 MG tablet TAKE 1 TABLET DAILY (Patient taking differently: Take 20 mg by mouth once a day) 90 tablet 3   No current facility-administered medications for this visit.       Physical Exam:   BP 132/90 (BP Location: Left Arm, Patient Position: Sitting, Cuff Size: Large)   Pulse 79   Resp 16   Ht 6\' 2"  (1.88 m)   Wt 294 lb (133.4 kg)   SpO2 98% Comment: ON RA  BMI 37.75 kg/m   General:  Obese but well appearing  Chest:   Clear to auscultation  CV:   Regular rate and rhythm without murmur  Incisions:  Completely healed  Abdomen:  Soft nontender  Extremities:  Warm and well-perfused  Diagnostic Tests:  2 channel telemetry rhythm strip demonstrates normal sinus rhythm   Transthoracic Echocardiography  Patient:    Joseph Garrett, Joseph Garrett MR #:       [id number] Study Date: 04/24/2016 Gender:     M Age:        55 Height:     188 cm Weight:     132.6 kg BSA:        2.68 m^2 Pt. Status: Room:  [id number]   ADMITTING    Doristine DevoidKakrakandy, Arshad N  ORDERING     Kakrakandy, Arshad N  REFERRING    WiltonKakrakandy, Arshad N  ATTENDING    Geoffery LyonsDelo, Douglas [phone]  PERFORMING   [facility], Inpatient  SONOGRAPHER  Sheralyn Boatmanina West  cc:  ------------------------------------------------------------------- LV EF: 50% -   55%  ------------------------------------------------------------------- Indications:      Chest pain 786.51.  ------------------------------------------------------------------- History:   PMH:  Patient is S/P maze procedure. NICM.  Syncope and dyspnea.  Transient ischemic attack.  Risk factors:  Hypertension. Obese.  ------------------------------------------------------------------- Study Conclusions  - Left ventricle: LV longitudinal lV strain is abnormal at -12%.   The cavity size was normal.  Systolic function was normal. The   estimated ejection fraction was in the Garrett of 50% to 55%.   Diffuse hypokinesis. - Aortic valve: Trileaflet; mildly thickened leaflets. - Right ventricle: Systolic function was moderately reduced. - Pulmonary arteries: PA peak pressure: 51 mm Hg (S). - Pericardium, extracardiac: A small, free-flowing pericardial   effusion was identified along the right ventricular free wall.   The fluid had no internal echoes.There was no evidence of   hemodynamic compromise.  Impressions:  - The right ventricular systolic pressure was increased consistent   with moderate pulmonary hypertension.  ------------------------------------------------------------------- Study data:  The previous study was not available, so comparison was made to the report of 09/16/2013.  Study status:  Routine. Procedure:  Transthoracic echocardiography. Image quality was poor. The study was technically difficult, as a result of body habitus. Study completion:  There were no complications. Transthoracic echocardiography.  M-mode, complete 2D, spectral Doppler, and color Doppler.  Birthdate:  Patient birthdate: 03/22/1961.  Age:  Patient is 55 yr old.  Sex:  Gender: male. BMI: 37.5 kg/m^2.  Blood pressure:     113/63  Patient status: Inpatient.  Study date:  Study date: 04/24/2016. Study time: 01:59 PM.  Location:  Bedside.  -------------------------------------------------------------------  ------------------------------------------------------------------- Left ventricle:  LV longitudinal lV strain is abnormal at -12%. The cavity size was normal. Systolic function was normal. The estimated ejection fraction was in the Garrett of 50% to 55%. Diffuse hypokinesis.  ------------------------------------------------------------------- Aortic valve:   Trileaflet; mildly thickened leaflets. Mobility was not restricted.  Doppler:  Transvalvular velocity was within the normal  Garrett. There was no stenosis. There was no regurgitation.   ------------------------------------------------------------------- Aorta:  Aortic root: The aortic root was normal in size.  ------------------------------------------------------------------- Mitral valve:   Structurally normal valve.   Mobility was not restricted.  Doppler:  Transvalvular velocity was within the normal Garrett. There was no evidence for stenosis. There was no regurgitation.    Peak gradient (D): 3 mm Hg.  ------------------------------------------------------------------- Left atrium:  The atrium was normal in size.  ------------------------------------------------------------------- Right ventricle:  The cavity size was normal. Wall thickness was normal. The moderator band was prominent. Systolic function was moderately reduced.  ------------------------------------------------------------------- Pulmonic valve:    Structurally normal valve.   Cusp separation was normal.  Doppler:  Transvalvular velocity was within the normal Garrett. There was no evidence for stenosis. There was no regurgitation.  ------------------------------------------------------------------- Tricuspid valve:   Structurally normal valve.    Doppler: Transvalvular velocity was within the normal Garrett. There was mild regurgitation.  ------------------------------------------------------------------- Pulmonary artery:   The main pulmonary artery was normal-sized. Systolic pressure was within the normal Garrett.  ------------------------------------------------------------------- Right atrium:  The atrium was normal in size.  ------------------------------------------------------------------- Pericardium:  A small, free-flowing pericardial effusion was identified along the right ventricular free wall.  The fluid had no internal echoes.There was no evidence of hemodynamic compromise.     ------------------------------------------------------------------- Systemic veins: Inferior vena cava: The vessel was dilated. The respirophasic diameter changes were blunted (< 50%), consistent with elevated central venous pressure.  ------------------------------------------------------------------- Measurements   Left ventricle                       Value        Reference  Stroke volume, 2D                    70    ml     ---------  Stroke volume/bsa, 2D                26    ml/m^2 ---------  LV e&', lateral                       7.62  cm/s   ---------  LV E/e&', lateral                     11.27        ---------  LV e&', medial                        9.36  cm/s   ---------  LV E/e&', medial                      9.18         ---------  LV e&', average                       8.49  cm/s   ---------  LV E/e&', average                     10.12        ---------  Longitudinal strain, TDI             12    %      ---------    LVOT                                 Value        Reference  LVOT ID, S                           24    mm     ---------  LVOT area                            4.52  cm^2   ---------  LVOT peak velocity, S                87.2  cm/s   ---------  LVOT mean velocity, S                67    cm/s   ---------  LVOT VTI, S                          15.5  cm     ---------  LVOT peak gradient, S                3     mm Hg  ---------  Aorta                                Value        Reference  Ascending aorta ID, A-P, S           28    mm     ---------    Left atrium                          Value        Reference  LA volume, S                         82.1  ml     ---------  LA volume/bsa, S                     30.6  ml/m^2 ---------  LA volume, ES, 1-p A4C               93.4  ml     ---------  LA volume/bsa, ES, 1-p A4C           34.9  ml/m^2 ---------  LA volume, ES, 1-p A2C               69.8  ml     ---------  LA volume/bsa, ES, 1-p A2C           26.1  ml/m^2  ---------    Mitral valve                         Value        Reference  Mitral E-wave peak velocity          85.9  cm/s   ---------  Mitral deceleration time       (H)   303   ms     150 - 230  Mitral peak gradient, D              3     mm Hg  ---------    Pulmonary arteries                   Value        Reference  PA pressure, S, DP             (H)   51    mm Hg  <=30    Tricuspid valve                      Value        Reference  Tricuspid regurg peak velocity       299   cm/s   ---------  Tricuspid peak RV-RA gradient        36    mm Hg  ---------    Systemic veins                       Value        Reference  Estimated CVP                        15    mm Hg  ---------    Right ventricle  Value        Reference  RV pressure, S, DP             (H)   51    mm Hg  <=30  Legend: (L)  and  (H)  mark values outside specified reference Garrett.  ------------------------------------------------------------------- Prepared and Electronically Authenticated by  Armanda Magic, MD 2017-10-25T15:37:10   Impression:  Patient is doing well proximally 2 months following minimally invasive Maze procedure. He is maintaining sinus rhythm and has recovered from his surgery without complications.   Plan:  I have instructed the patient to cut his dose of amiodarone to 100 mg daily. We will make sure that he has a routine follow-up appointment scheduled in the atrial fibrillation clinic early next year. If he remains in sinus rhythm at that time I think he could probably stop amiodarone. At this point the patient may resume unrestricted physical activity. He will return to our office for routine follow-up next September for routine follow-up 1 year postoperatively.     Salvatore Decent. Cornelius Moras, MD 05/27/2016 11:20 AM

## 2016-05-27 NOTE — Telephone Encounter (Signed)
-----   Message from Davina Poke, RN sent at 05/27/2016 12:16 PM EST ----- Hey,   [name] wants this patient to be seen in 6 weeks.  Patients want an AM appt.  Thanks,  [employer]

## 2016-06-17 ENCOUNTER — Other Ambulatory Visit: Payer: Self-pay | Admitting: Surgical

## 2016-06-19 ENCOUNTER — Encounter: Payer: Self-pay | Admitting: *Deleted

## 2016-07-10 ENCOUNTER — Ambulatory Visit (INDEPENDENT_AMBULATORY_CARE_PROVIDER_SITE_OTHER): Payer: BLUE CROSS/BLUE SHIELD | Admitting: Internal Medicine

## 2016-07-10 ENCOUNTER — Encounter: Payer: Self-pay | Admitting: Internal Medicine

## 2016-07-10 VITALS — BP 112/84 | HR 81 | Ht 74.0 in | Wt 293.0 lb

## 2016-07-10 DIAGNOSIS — I4819 Other persistent atrial fibrillation: Secondary | ICD-10-CM

## 2016-07-10 DIAGNOSIS — I428 Other cardiomyopathies: Secondary | ICD-10-CM | POA: Diagnosis not present

## 2016-07-10 DIAGNOSIS — G4733 Obstructive sleep apnea (adult) (pediatric): Secondary | ICD-10-CM

## 2016-07-10 DIAGNOSIS — Z9889 Other specified postprocedural states: Secondary | ICD-10-CM | POA: Diagnosis not present

## 2016-07-10 DIAGNOSIS — I481 Persistent atrial fibrillation: Secondary | ICD-10-CM

## 2016-07-10 DIAGNOSIS — Z8679 Personal history of other diseases of the circulatory system: Secondary | ICD-10-CM

## 2016-07-10 DIAGNOSIS — I10 Essential (primary) hypertension: Secondary | ICD-10-CM | POA: Diagnosis not present

## 2016-07-10 NOTE — Patient Instructions (Signed)
Medication Instructions:  Your physician has recommended you make the following change in your medication:  1) Stop Amiodarone   Labwork: None ordered   Testing/Procedures: None ordered   Follow-Up: Your physician recommends that you schedule a follow-up appointment in: 3 months with Rudi Coco, NP and 6 months with Dr [name]   Any Other Special Instructions Will Be Listed Below (If Applicable).     If you need a refill on your cardiac medications before your next appointment, please call your pharmacy.

## 2016-07-10 NOTE — Progress Notes (Signed)
PCP: Joseph Garrett is a 56 y.o. male who presents today for electrophysiology follow up.  Doing very well s/p MAZE.  Maintaining sinus rhythm.  He has some fatigue as well as SOB with moderate activity.  He denies CP, dizziness, presyncope, or new neurologic symptoms.  The patient is otherwise without complaint today.   Past Medical History:  Diagnosis Date  . CHF (congestive heart failure) (HCC)   . Chronic combined systolic and diastolic CHF (congestive heart failure) (HCC)   . Cirrhosis (HCC)    liver scarring on biopsy, presumed to be due to methotrexate  . Depression   . DJD (degenerative joint disease)   . Essential hypertension 08/30/2013  . GERD (gastroesophageal reflux disease)    not present  . Gout   . Hives   . Hypercholesteremia   . Hypertension   . Mitral regurgitation - type I dysfunction - mild to moderate   . Obesity   . OSA (obstructive sleep apnea)    mild, uses CPAP  . Paroxysmal atrial fibrillation (HCC)   . Persistent atrial fibrillation (HCC) 03/02/2012  . Psoriasis   . S/P Minimally invasive maze operation for atrial fibrillation 03/20/2016   Complete bilateral atrial lesion set using cryothermy and bipolar radiofrequency ablation via right mini thoracotomy approach with oversewing of LA appendage  . Shortness of breath dyspnea    exertion  . Syncope 02/21/2014  . Thrombocytopenia (HCC)   . TIA (transient ischemic attack)    Past Surgical History:  Procedure Laterality Date  . ABLATION OF DYSRHYTHMIC FOCUS  09/17/2012  . ATRIAL FIBRILLATION ABLATION  03/17/12   PVI and CTI ablation by Dr [name]  . ATRIAL FIBRILLATION ABLATION N/A 03/17/2012   Procedure: ATRIAL FIBRILLATION ABLATION;  Surgeon: Hillis Range, MD;  Location: [facility] CATH LAB;  Service: Cardiovascular;  Laterality: N/A;  . ATRIAL FIBRILLATION ABLATION N/A 09/18/2012   Procedure: ATRIAL FIBRILLATION ABLATION;  Surgeon: Hillis Range, MD;  Location: [facility] CATH LAB;  Service: Cardiovascular;   Laterality: N/A;  . CARDIAC CATHETERIZATION N/A 01/25/2016   Procedure: Right/Left Heart Cath and Coronary Angiography;  Surgeon: Laurey Morale, MD;  Location: [facility] INVASIVE CV LAB;  Service: Cardiovascular;  Laterality: N/A;  . CARDIOVERSION N/A 10/09/2012   Procedure: CARDIOVERSION;  Surgeon: Laurey Morale, MD;  Location: [facility] ENDOSCOPY;  Service: Cardiovascular;  Laterality: N/A;  . CARDIOVERSION N/A 02/22/2014   Procedure: CARDIOVERSION;  Surgeon: Quintella Reichert, MD;  Location: [facility] ENDOSCOPY;  Service: Cardiovascular;  Laterality: N/A;  . CARDIOVERSION N/A 12/29/2015   Procedure: CARDIOVERSION;  Surgeon: Hillis Range, MD;  Location: [facility] OR;  Service: Cardiovascular;  Laterality: N/A;  . CHOLECYSTECTOMY  2011  . ELBOW ARTHROSCOPY Left 08/2014  . ELECTROPHYSIOLOGIC STUDY N/A 12/29/2015   Procedure: Cardioversion;  Surgeon: Hillis Range, MD;  Location: [facility] INVASIVE CV LAB;  Service: Cardiovascular;  Laterality: N/A;  . EYE SURGERY Bilateral 04/2015   cataract surgery  . KNEE ARTHROSCOPY Right   . MINIMALLY INVASIVE MAZE PROCEDURE N/A 03/20/2016   Procedure: MINIMALLY INVASIVE MAZE PROCEDURE;  Surgeon: Purcell Nails, MD;  Location: [facility] OR;  Service: Open Heart Surgery;  Laterality: N/A;  . NOSE SURGERY     Turbinates  . RECONSTRUCTION OF NOSE    . TEE WITHOUT CARDIOVERSION  03/16/2012   Procedure: TRANSESOPHAGEAL ECHOCARDIOGRAM (TEE);  Surgeon: [name], MD;  Location: [facility] ENDOSCOPY;  Service: Cardiovascular;  Laterality: N/A;  . TEE WITHOUT CARDIOVERSION N/A 09/17/2012   Procedure: TRANSESOPHAGEAL ECHOCARDIOGRAM (TEE);  Surgeon: Lewayne Bunting, MD;  Location: [facility] ENDOSCOPY;  Service: Cardiovascular;  Laterality: N/A;  . TEE WITHOUT CARDIOVERSION N/A 01/25/2016   Procedure: TRANSESOPHAGEAL ECHOCARDIOGRAM (TEE);  Surgeon: Laurey Morale, MD;  Location: [facility] ENDOSCOPY;  Service: Cardiovascular;  Laterality: N/A;  . TEE WITHOUT CARDIOVERSION N/A 03/20/2016   Procedure: TRANSESOPHAGEAL ECHOCARDIOGRAM  (TEE);  Surgeon: Purcell Nails, MD;  Location: [facility] OR;  Service: Open Heart Surgery;  Laterality: N/A;  . TENDON REPAIR     Right Forearm  . TENDON REPAIR Right 1990's   forearm    Current Outpatient Prescriptions  Medication Sig Dispense Refill  . amiodarone (PACERONE) 200 MG tablet TAKE 1 TABLET DAILY (Patient taking differently: Take 200 mg by mouth once a day) 180 tablet 3  . carvedilol (COREG) 3.125 MG tablet Take 1 tablet (3.125 mg) as need when BP remain above 140/90.    Marland Kitchen ibuprofen (ADVIL,MOTRIN) 200 MG tablet Take 400-800 mg by mouth every 6 (six) hours as needed for headache (or pain).    Marland Kitchen oxyCODONE (OXY IR/ROXICODONE) 5 MG immediate release tablet Take 1-2 tablets (5-10 mg total) by mouth every 6 (six) hours as needed for severe pain. 30 tablet 0  . oxymetazoline (AFRIN) 0.05 % nasal spray Place 2 sprays into both nostrils 2 (two) times daily as needed for congestion.    . rivaroxaban (XARELTO) 20 MG TABS tablet Take 1 tablet (20 mg total) by mouth daily with supper. 30 tablet 1  . sertraline (ZOLOFT) 100 MG tablet Take 150 mg by mouth every morning.     . simvastatin (ZOCOR) 20 MG tablet TAKE 1 TABLET DAILY (Patient taking differently: Take 20 mg by mouth once a day) 90 tablet 3  . Suvorexant (BELSOMRA) 15 MG TABS Take 15 mg by mouth at bedtime.     No current facility-administered medications for this visit.     Physical Exam: Vitals:   07/10/16 0938  BP: 112/84  Pulse: 81  Weight: 293 lb (132.9 kg)  Height: 6\' 2"  (1.88 m)    GEN- The patient is well appearing, alert and oriented x 3 today.   Head- normocephalic, atraumatic Eyes-  Sclera clear, conjunctiva pink Ears- hearing intact Oropharynx- clear Lungs- Clear to ausculation bilaterally with diffuse expiratory wheezes on forced expiration, normal work of breathing Heart- regular rate and rhythm, no murmurs, rubs or gallops, PMI not laterally displaced GI- soft, NT, ND, + BS Extremities- no clubbing, cyanosis,  or edema  ekg today reveals sinus rhythm 81 bpm, otherwise normal ekg  Assessment and Plan:  1.  Persistent afib Doing well s/p MAZE Maintaining sinus rhythm Stop amiodarone chads2vasc score is 4.  Given prior TIA, I would advise that he continue xarelto long term unless significant bleeding risks even though he has had LAA oversewn surgically.  2. HTN Stable with prn coreg  3. OSA He reports compliance with CPAP  4. Nonischemic CM EF has recovered  Follow-up in AF clinic in 3 months I will see in 6 months 1 year follow-up with Dr Cornelius Moras scheduled already  Hillis Range MD, [facility] 07/10/2016 9:51 AM

## 2016-10-09 ENCOUNTER — Ambulatory Visit (HOSPITAL_COMMUNITY)
Admission: RE | Admit: 2016-10-09 | Discharge: 2016-10-09 | Disposition: A | Discharge status: Home / Self Care | Payer: No Typology Code available for payment source | Source: Ambulatory Visit | Attending: Nurse Practitioner | Admitting: Nurse Practitioner

## 2016-10-09 ENCOUNTER — Encounter (HOSPITAL_COMMUNITY): Payer: Self-pay | Admitting: Nurse Practitioner

## 2016-10-09 VITALS — BP 124/78 | HR 82 | Ht 74.0 in | Wt 299.0 lb

## 2016-10-09 DIAGNOSIS — I428 Other cardiomyopathies: Secondary | ICD-10-CM | POA: Diagnosis not present

## 2016-10-09 DIAGNOSIS — I110 Hypertensive heart disease with heart failure: Secondary | ICD-10-CM | POA: Diagnosis not present

## 2016-10-09 DIAGNOSIS — I5042 Chronic combined systolic (congestive) and diastolic (congestive) heart failure: Secondary | ICD-10-CM | POA: Insufficient documentation

## 2016-10-09 DIAGNOSIS — Z9889 Other specified postprocedural states: Secondary | ICD-10-CM | POA: Insufficient documentation

## 2016-10-09 DIAGNOSIS — Z7901 Long term (current) use of anticoagulants: Secondary | ICD-10-CM | POA: Insufficient documentation

## 2016-10-09 DIAGNOSIS — Z8679 Personal history of other diseases of the circulatory system: Secondary | ICD-10-CM

## 2016-10-09 DIAGNOSIS — Z880 Allergy status to penicillin: Secondary | ICD-10-CM | POA: Insufficient documentation

## 2016-10-09 DIAGNOSIS — Z79899 Other long term (current) drug therapy: Secondary | ICD-10-CM | POA: Insufficient documentation

## 2016-10-09 DIAGNOSIS — Z8673 Personal history of transient ischemic attack (TIA), and cerebral infarction without residual deficits: Secondary | ICD-10-CM | POA: Diagnosis not present

## 2016-10-09 DIAGNOSIS — I481 Persistent atrial fibrillation: Secondary | ICD-10-CM | POA: Diagnosis present

## 2016-10-09 NOTE — Progress Notes (Signed)
Patient ID: Joseph Garrett, male   DOB: Jan 08, 1961, 56 y.o.   MRN: [id number]     Primary Care Physician: Roxanne Mins, PA-C Referring Physician: Dr. Donne [name] is a 56 y.o. male with a h/o persistent afib s/p abaltion x 2 on amiodarone. He is here for ER f/u from 5/7, where he presented in afib and did not cardiovert x 3 attempts.  It was recommended that he increase amiodarone to 400 mg daily and f/u in afib clinic. He did increase amiodarone x 2 -3 weeks but recently reduced dose on his own and called to make appointment. He does feel worse in afib. He states his last cardioversion was in the cath lab and he required 300 joules. He has not missed any doses of xarelto. He was sent to pulmonology for abnormal PFT's last fall and was asked to return in 4 months for repeat testing and he cancelled appointment.   He was rescheduled in the cath lab with cardioversion with Dr. [name]. He did return to SR but had ERAF. He was then referred to [name] for a maze procedure which is pending 9/18. He asked to be seen today for just getting tired of afib/ fatigue, intermittent dizziness. He does not check his BP/HR at home and asked if he could be seen today.   In office, 9/5, he is in afib at 87 bpm and BP is acceptable at 126/92. He does fly to [place] on Thursday. Will increase coreg to 6.25 mg bid to see if he feels better.  F/u afib clinic 4/10 s/p maze procedure 03/20/16. He has not noted any afib since procedure. He feels that he is slowly getting back to baseline, as far as energy, from the surgery. He is off amiodarone and will continue on xarelto long term.  Today, he denies symptoms of palpitations, chest pain, shortness of breath, orthopnea, PND, lower extremity edema, dizziness, presyncope, syncope, or neurologic sequela. Fatigue improving. The patient is tolerating medications without difficulties and is otherwise without complaint today.   Past Medical History:  Diagnosis Date   . CHF (congestive heart failure) (HCC)   . Chronic combined systolic and diastolic CHF (congestive heart failure) (HCC)   . Cirrhosis (HCC)    liver scarring on biopsy, presumed to be due to methotrexate  . Depression   . DJD (degenerative joint disease)   . Essential hypertension 08/30/2013  . GERD (gastroesophageal reflux disease)    not present  . Gout   . Hives   . Hypercholesteremia   . Hypertension   . Mitral regurgitation - type I dysfunction - mild to moderate   . Obesity   . OSA (obstructive sleep apnea)    mild, uses CPAP  . Paroxysmal atrial fibrillation (HCC)   . Persistent atrial fibrillation (HCC) 03/02/2012  . Psoriasis   . S/P Minimally invasive maze operation for atrial fibrillation 03/20/2016   Complete bilateral atrial lesion set using cryothermy and bipolar radiofrequency ablation via right mini thoracotomy approach with oversewing of LA appendage  . Shortness of breath dyspnea    exertion  . Syncope 02/21/2014  . Thrombocytopenia (HCC)   . TIA (transient ischemic attack)    Past Surgical History:  Procedure Laterality Date  . ABLATION OF DYSRHYTHMIC FOCUS  09/17/2012  . ATRIAL FIBRILLATION ABLATION  03/17/12   PVI and CTI ablation by Dr [name]  . ATRIAL FIBRILLATION ABLATION N/A 03/17/2012   Procedure: ATRIAL FIBRILLATION ABLATION;  Surgeon: Hillis Range, MD;  Location:  [facility] CATH LAB;  Service: Cardiovascular;  Laterality: N/A;  . ATRIAL FIBRILLATION ABLATION N/A 09/18/2012   Procedure: ATRIAL FIBRILLATION ABLATION;  Surgeon: Hillis Range, MD;  Location: [facility] CATH LAB;  Service: Cardiovascular;  Laterality: N/A;  . CARDIAC CATHETERIZATION N/A 01/25/2016   Procedure: Right/Left Heart Cath and Coronary Angiography;  Surgeon: Laurey Morale, MD;  Location: [facility] INVASIVE CV LAB;  Service: Cardiovascular;  Laterality: N/A;  . CARDIOVERSION N/A 10/09/2012   Procedure: CARDIOVERSION;  Surgeon: Laurey Morale, MD;  Location: [facility] ENDOSCOPY;  Service: Cardiovascular;  Laterality:  N/A;  . CARDIOVERSION N/A 02/22/2014   Procedure: CARDIOVERSION;  Surgeon: Quintella Reichert, MD;  Location: [facility] ENDOSCOPY;  Service: Cardiovascular;  Laterality: N/A;  . CARDIOVERSION N/A 12/29/2015   Procedure: CARDIOVERSION;  Surgeon: Hillis Range, MD;  Location: [facility] OR;  Service: Cardiovascular;  Laterality: N/A;  . CHOLECYSTECTOMY  2011  . ELBOW ARTHROSCOPY Left 08/2014  . ELECTROPHYSIOLOGIC STUDY N/A 12/29/2015   Procedure: Cardioversion;  Surgeon: Hillis Range, MD;  Location: [facility] INVASIVE CV LAB;  Service: Cardiovascular;  Laterality: N/A;  . EYE SURGERY Bilateral 04/2015   cataract surgery  . KNEE ARTHROSCOPY Right   . MINIMALLY INVASIVE MAZE PROCEDURE N/A 03/20/2016   Procedure: MINIMALLY INVASIVE MAZE PROCEDURE;  Surgeon: Purcell Nails, MD;  Location: [facility] OR;  Service: Open Heart Surgery;  Laterality: N/A;  . NOSE SURGERY     Turbinates  . RECONSTRUCTION OF NOSE    . TEE WITHOUT CARDIOVERSION  03/16/2012   Procedure: TRANSESOPHAGEAL ECHOCARDIOGRAM (TEE);  Surgeon: [name], MD;  Location: [facility] ENDOSCOPY;  Service: Cardiovascular;  Laterality: N/A;  . TEE WITHOUT CARDIOVERSION N/A 09/17/2012   Procedure: TRANSESOPHAGEAL ECHOCARDIOGRAM (TEE);  Surgeon: Lewayne Bunting, MD;  Location: [facility] ENDOSCOPY;  Service: Cardiovascular;  Laterality: N/A;  . TEE WITHOUT CARDIOVERSION N/A 01/25/2016   Procedure: TRANSESOPHAGEAL ECHOCARDIOGRAM (TEE);  Surgeon: Laurey Morale, MD;  Location: [facility] ENDOSCOPY;  Service: Cardiovascular;  Laterality: N/A;  . TEE WITHOUT CARDIOVERSION N/A 03/20/2016   Procedure: TRANSESOPHAGEAL ECHOCARDIOGRAM (TEE);  Surgeon: Purcell Nails, MD;  Location: [facility] OR;  Service: Open Heart Surgery;  Laterality: N/A;  . TENDON REPAIR     Right Forearm  . TENDON REPAIR Right 1990's   forearm    Current Outpatient Prescriptions  Medication Sig Dispense Refill  . carvedilol (COREG) 3.125 MG tablet Take 1 tablet (3.125 mg) as need when BP remain above 140/90.    Marland Kitchen ibuprofen  (ADVIL,MOTRIN) 200 MG tablet Take 400-800 mg by mouth every 6 (six) hours as needed for headache (or pain).    Marland Kitchen oxyCODONE (OXY IR/ROXICODONE) 5 MG immediate release tablet Take 1-2 tablets (5-10 mg total) by mouth every 6 (six) hours as needed for severe pain. 30 tablet 0  . oxymetazoline (AFRIN) 0.05 % nasal spray Place 2 sprays into both nostrils 2 (two) times daily as needed for congestion.    . rivaroxaban (XARELTO) 20 MG TABS tablet Take 1 tablet (20 mg total) by mouth daily with supper. 30 tablet 1  . sertraline (ZOLOFT) 100 MG tablet Take 150 mg by mouth every morning.     . simvastatin (ZOCOR) 20 MG tablet TAKE 1 TABLET DAILY (Patient taking differently: Take 20 mg by mouth once a day) 90 tablet 3  . zolpidem (AMBIEN) 10 MG tablet Take 10 mg by mouth at bedtime as needed for sleep.     No current facility-administered medications for this encounter.     Allergies  Allergen Reactions  .  Penicillins Rash    Has patient had a PCN reaction causing immediate rash, facial/tongue/throat swelling, SOB or lightheadedness with hypotension: Yes Has patient had a PCN reaction causing severe rash involving mucus membranes or skin necrosis: No Has patient had a PCN reaction that required hospitalization No Has patient had a PCN reaction occurring within the last 10 years: No If all of the above answers are "NO", then may proceed with Cephalosporin use.     Social History   Social History  . Marital status: Married    Spouse name: N/A  . Number of children: N/A  . Years of education: N/A   Occupational History  . Not on file.   Social History Main Topics  . Smoking status: Never Smoker  . Smokeless tobacco: Never Used  . Alcohol use No  . Drug use: No  . Sexual activity: Not on file   Other Topics Concern  . Not on file   Social History Narrative   Lives in [place] with spouse.  3 children are healthy.     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     Family History  Problem Relation Age of Onset  . Heart attack Father   . Heart disease    . Diabetes      ROS- All systems are reviewed and negative except as per the HPI above  Physical Exam: Vitals:   10/09/16 0835  BP: 124/78  Pulse: 82  Weight: 299 lb (135.6 kg)  Height: 6\' 2"  (1.88 m)    GEN- The patient is well appearing, alert and oriented x 3 today.   Head- normocephalic, atraumatic Eyes-  Sclera clear, conjunctiva pink Ears- hearing intact Oropharynx- clear Neck- supple, no JVP Lymph- no cervical lymphadenopathy Lungs- Clear to ausculation bilaterally, normal work of breathing Heart-regular rate and rhythm, no murmurs, rubs or gallops, PMI not laterally displaced GI- soft, NT, ND, + BS Extremities- no clubbing, cyanosis, or edema MS- no significant deformity or atrophy Skin- no rash or lesion Psych- euthymic mood, full affect Neuro- strength and sensation are intact  EKG- NSR at 82 bpm, pr int 162 ms, qrs int 86 ms, qtc 457 ms.  Epic records reviewed  Assessment and Plan: 1.Persisitent symptomatic afib with failed cardioversion x2, May and June of 2017, as well as failed abation x2. Underwent Maze surgery 03/2016 and has been maintaining SR Off amioderone Continue xarelto long term due to h/o TIA  2. HTN Stable  3. Nonischemic CM Stable  F/u with Dr. [name] this summer as scheduled and [name] 10/1 afib clinic as scheduled  Elvina Sidle. [facility] [address] [place], [place] [address] [phone]

## 2016-11-02 IMAGING — CR DG CHEST 2V
2 series · 2 of 2 positions shown · non-contrast
Comparison: None.

CLINICAL DATA: Recurrent atrial fibrillation.

EXAM:
CHEST  2 VIEW

[w chest pa]
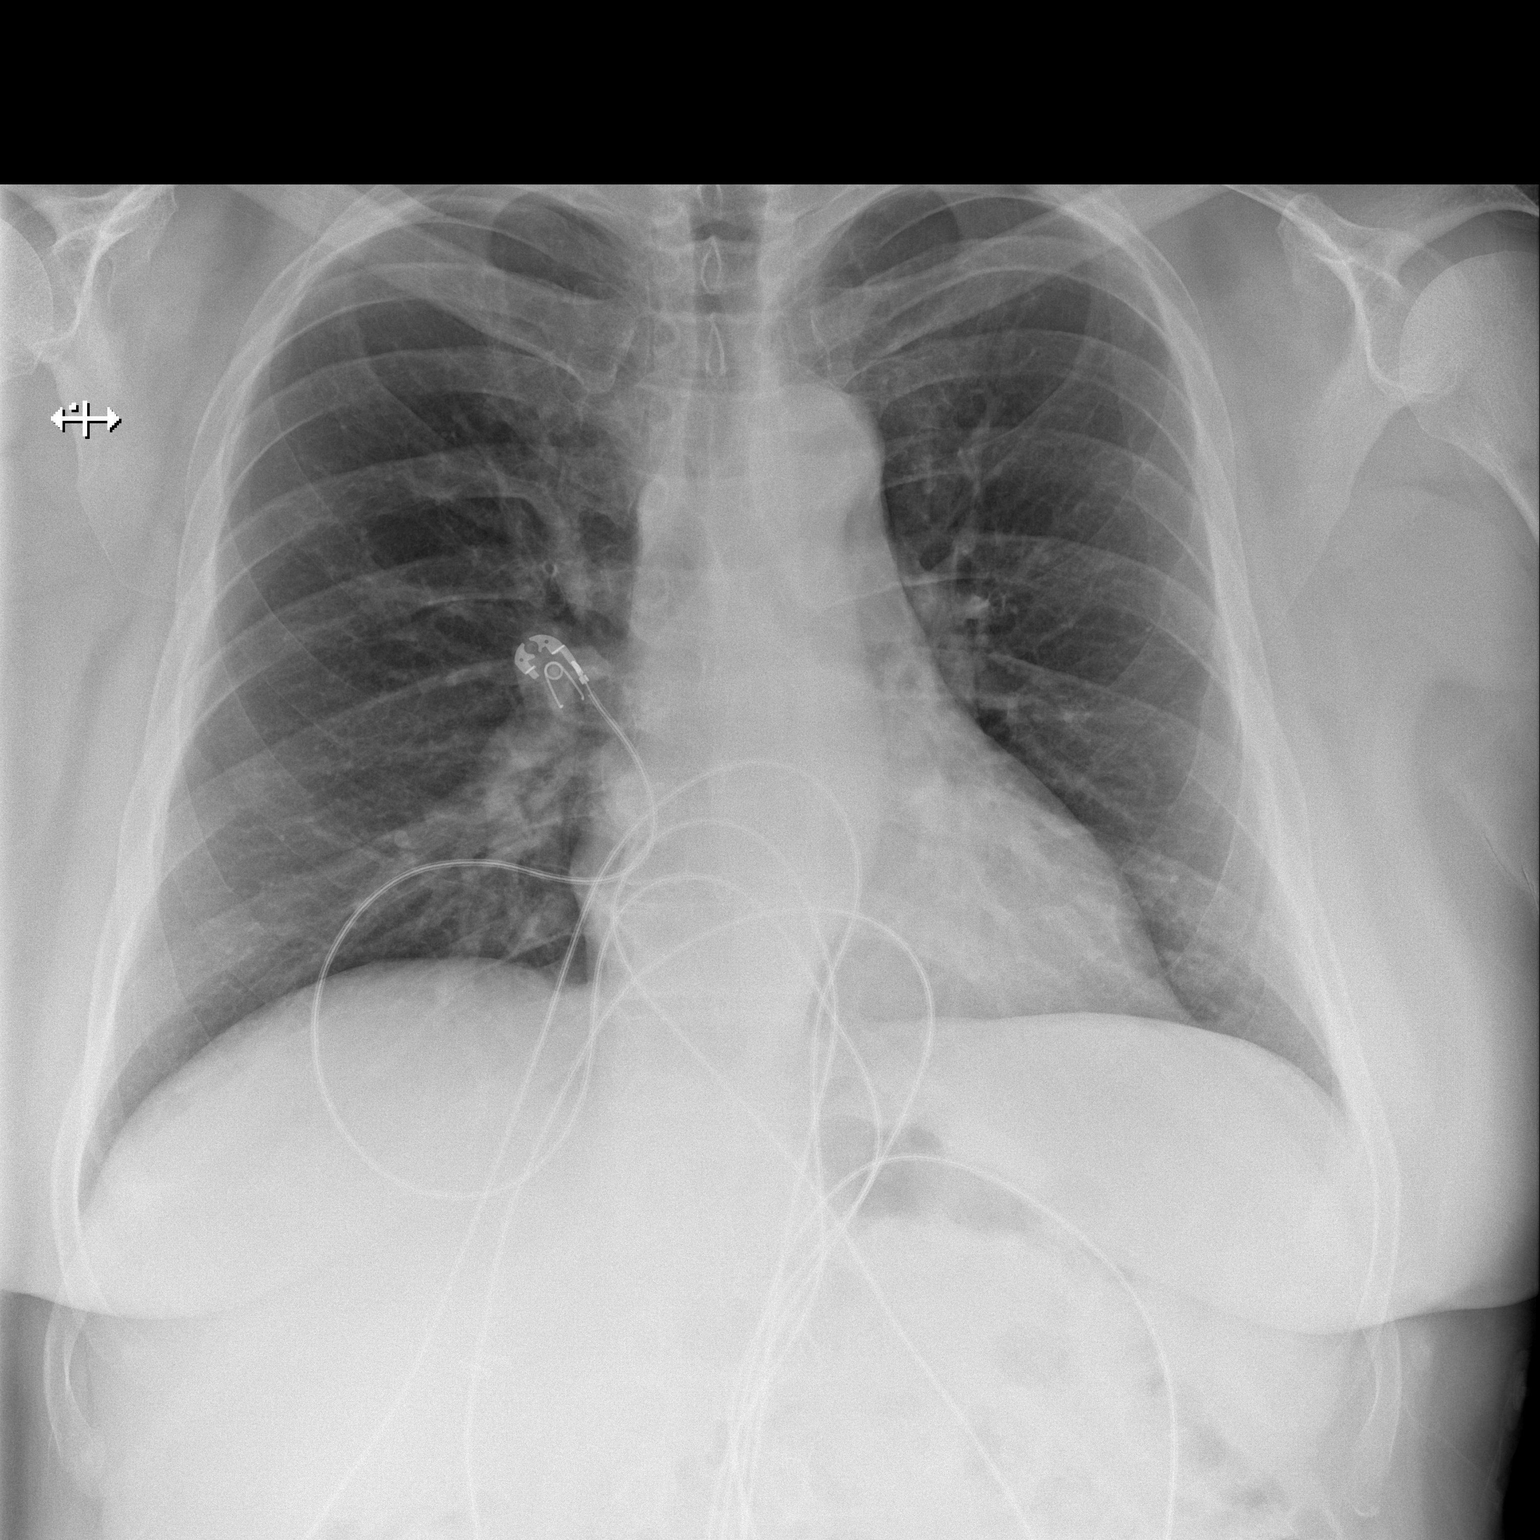

[w chest lat]
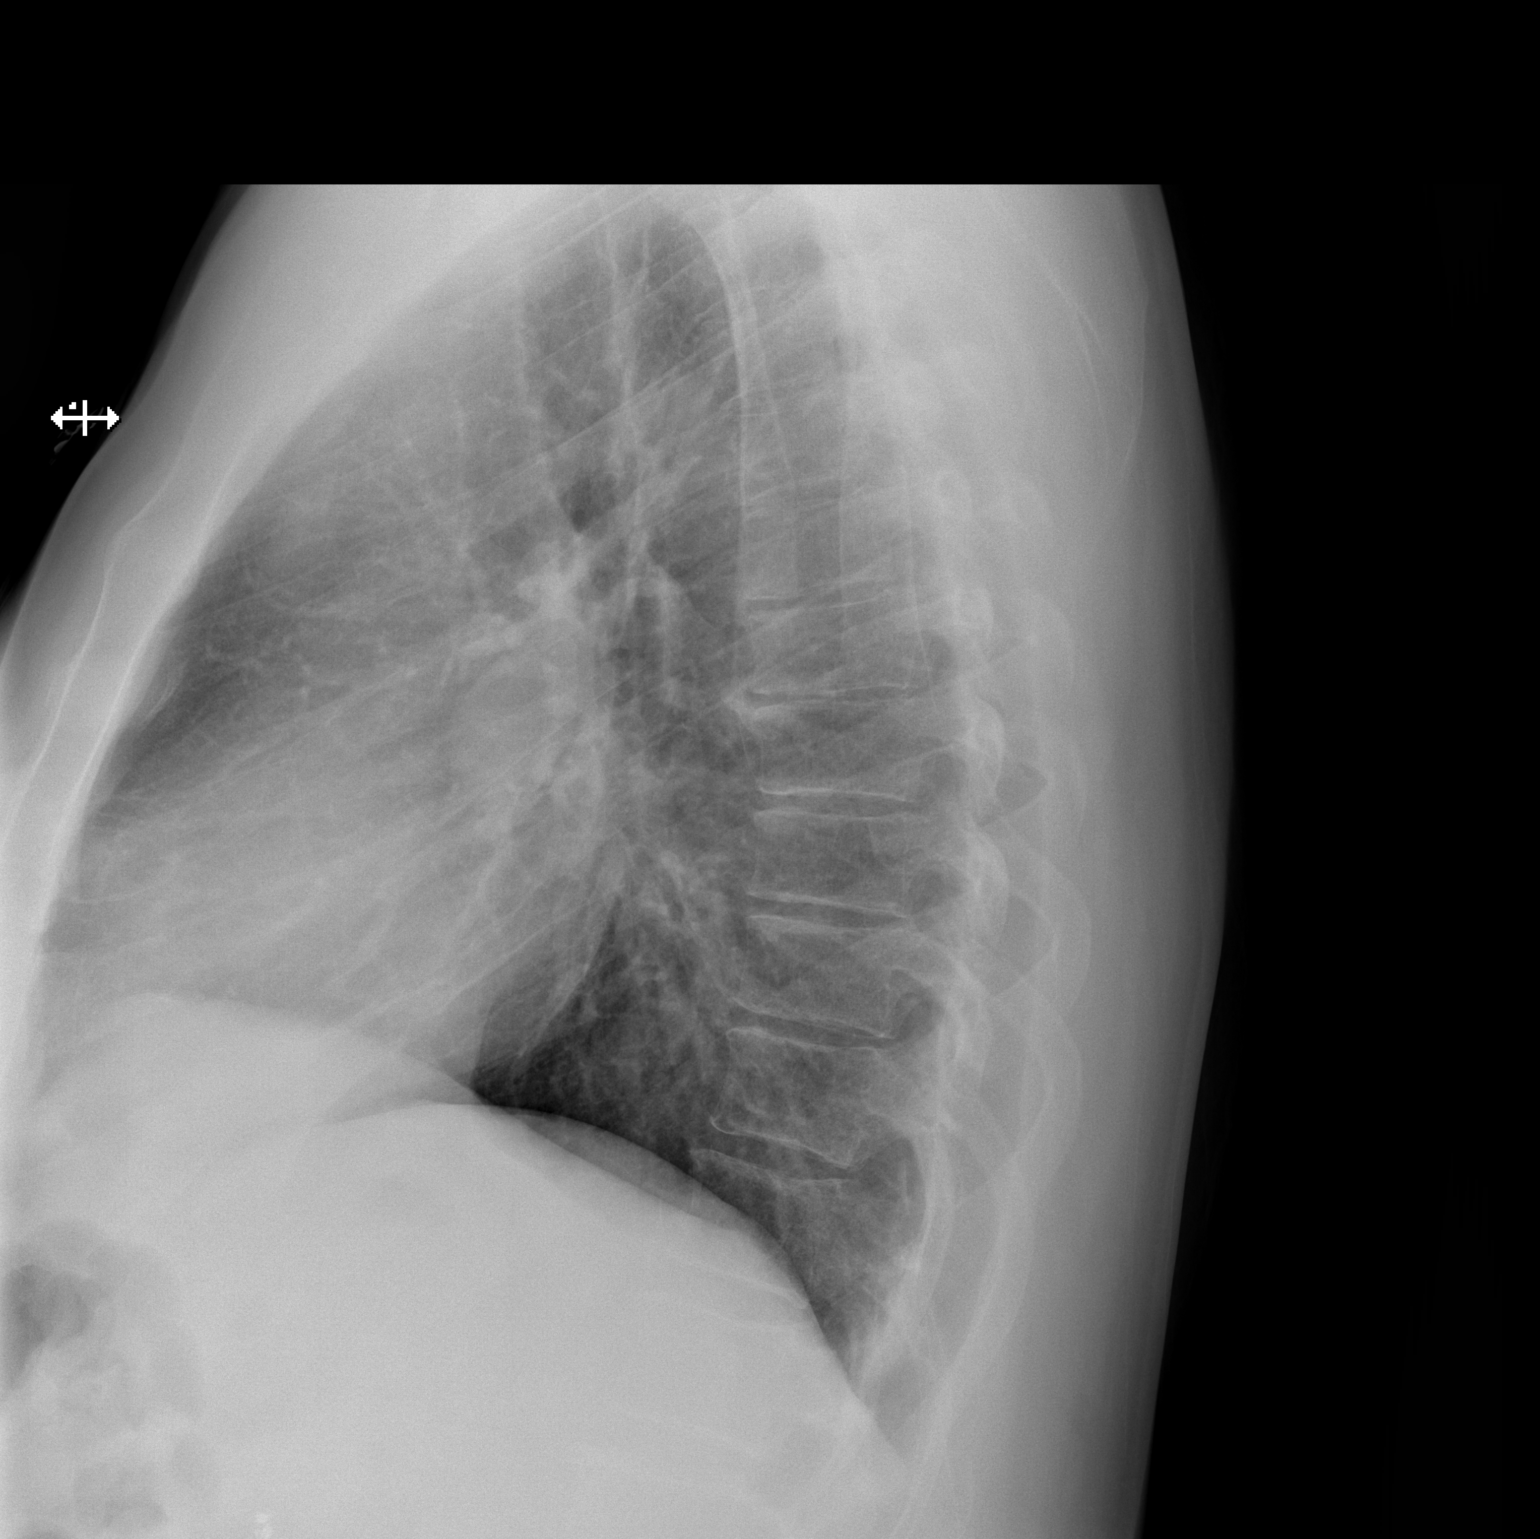

[2 of 2 positions shown; findings below may reference images not displayed]

FINDINGS: Artifact overlies chest. Heart size is normal. Mediastinal shadows
are normal. The lungs are clear. No bronchial thickening. No
infiltrate, mass, effusion or collapse. Pulmonary vascularity is
normal. No bony abnormality.
IMPRESSION: Normal chest

## 2017-01-23 ENCOUNTER — Other Ambulatory Visit: Payer: Self-pay | Admitting: Internal Medicine

## 2017-03-31 ENCOUNTER — Ambulatory Visit: Payer: BLUE CROSS/BLUE SHIELD | Admitting: Thoracic Surgery (Cardiothoracic Vascular Surgery)

## 2017-04-07 ENCOUNTER — Encounter: Payer: Self-pay | Admitting: Thoracic Surgery (Cardiothoracic Vascular Surgery)

## 2017-04-07 ENCOUNTER — Ambulatory Visit: Payer: No Typology Code available for payment source | Admitting: Thoracic Surgery (Cardiothoracic Vascular Surgery)

## 2017-04-07 ENCOUNTER — Ambulatory Visit (INDEPENDENT_AMBULATORY_CARE_PROVIDER_SITE_OTHER): Payer: BLUE CROSS/BLUE SHIELD | Admitting: Thoracic Surgery (Cardiothoracic Vascular Surgery)

## 2017-04-07 VITALS — BP 117/81 | HR 71 | Resp 20 | Ht 74.0 in | Wt 290.0 lb

## 2017-04-07 DIAGNOSIS — Z9889 Other specified postprocedural states: Secondary | ICD-10-CM

## 2017-04-07 DIAGNOSIS — Z8679 Personal history of other diseases of the circulatory system: Secondary | ICD-10-CM

## 2017-04-07 NOTE — Progress Notes (Signed)
[address]       [place] [address]             [phone]     CARDIOTHORACIC SURGERY OFFICE NOTE  Referring Provider is Hillis Range, MD PCP is Coralee Rud, PA-C   HPI:  Patient is a 56 year old male who returns to the office today for routine follow-up approximately one year status post minimally invasive Maze procedure. He was last seen here in our office on 05/27/2016 at which time he was doing well and maintaining sinus rhythm.  Since then he has been seen in follow-up by Dr. [name] and more recently by Rudi Coco in the atrial fibrillation clinic on 10/09/2016.  He has continued to maintain sinus rhythm ever since his Maze procedure. He returns to our office today and reports that he continues to do very well. He has not experienced any symptoms of palpitations to suggest a recurrence of atrial fibrillation. He states that at times he feels his heart pounding but when he checks his pulse it is nice and regular. He has not had problems with congestive heart failure. He injured his knee recently and has been scheduled for reconstructive surgery. He stopped taking Xarelto and began taking aspirin within the past 6 months because his insurance dropped coverage for Xarelto and the costs were prohibitive.   Current Outpatient Prescriptions  Medication Sig Dispense Refill  . carvedilol (COREG) 3.125 MG tablet Take 1 tablet (3.125 mg) as need when BP remain above 140/90.    Marland Kitchen ibuprofen (ADVIL,MOTRIN) 200 MG tablet Take 400-800 mg by mouth every 6 (six) hours as needed for headache (or pain).    Marland Kitchen oxyCODONE (OXY IR/ROXICODONE) 5 MG immediate release tablet Take 1-2 tablets (5-10 mg total) by mouth every 6 (six) hours as needed for severe pain. 30 tablet 0  . oxymetazoline (AFRIN) 0.05 % nasal spray Place 2 sprays into both nostrils 2 (two) times daily as needed for congestion.    . sertraline (ZOLOFT) 100 MG tablet Take 150 mg by mouth every morning.     .  simvastatin (ZOCOR) 20 MG tablet TAKE 1 TABLET BY MOUTH EVERY DAY 90 tablet 1  . zolpidem (AMBIEN) 10 MG tablet Take 10 mg by mouth at bedtime as needed for sleep.    . rivaroxaban (XARELTO) 20 MG TABS tablet Take 1 tablet (20 mg total) by mouth daily with supper. (Patient not taking: Reported on 04/07/2017) 30 tablet 1   No current facility-administered medications for this visit.       Physical Exam:   BP 117/81   Pulse 71   Resp 20   Ht 6\' 2"  (1.88 m)   Wt 290 lb (131.5 kg)   SpO2 96% Comment: RA  BMI 37.23 kg/m   General:  Well-appearing  Chest:   Clear to auscultation with symmetrical breath sounds  CV:   Regular rate and rhythm  Incisions:  Completely healed  Abdomen:  Soft nontender  Extremities:  Warm and well-perfused  Diagnostic Tests:  2 channel telemetry rhythm strip demonstrates normal sinus rhythm     Impression:  Patient is doing well and maintaining sinus rhythm more than one year status post minimally invasive Maze procedure. He stopped taking Xarelto within the past 6 months because of prohibitive cost. Because he reported a question with history of TIA in the past, his CHADS-vasc score is probably at least 2.   Plan:  I had a discussion with Mr. Paul Half  regarding whether or not he should remain on long-term anticoagulation therapy. He may be amenable to trying a different agent but he does not wish to resume Xarelto because of the cost. I suggested that he discuss this further with Dr. [name] and Arnette Felts.  The Maze procedure and left atrial clip should provide some protection from risk of thromboembolism and stroke, but there is no convincing evidence at this time to suggest that this should change long-term therapy in patients who are otherwise tolerating anticoagulation treatment. The patient will continue to follow-up with Dr. [name] and in the atrial fibrillation clinic. He will return to our office in the future only should further problems or questions  arise.  I spent in excess of 15 minutes during the conduct of this office consultation and >50% of this time involved direct face-to-face encounter with the patient for counseling and/or coordination of their care.    Salvatore Decent. Cornelius Moras, MD 04/07/2017 9:51 AM

## 2017-04-07 NOTE — Patient Instructions (Signed)
Continue all previous medications without any changes at this time  Discuss with Dr [name] and Roxanne Mins whether or not you should resume taking Xarelto, stay on aspirin, or switch to a different medication for long term anticoagulation

## 2018-05-13 ENCOUNTER — Emergency Department (HOSPITAL_COMMUNITY): Payer: BLUE CROSS/BLUE SHIELD

## 2018-05-13 ENCOUNTER — Encounter (HOSPITAL_COMMUNITY): Payer: Self-pay

## 2018-05-13 ENCOUNTER — Inpatient Hospital Stay (HOSPITAL_COMMUNITY)
Admission: EM | Admit: 2018-05-13 | Discharge: 2018-05-15 | DRG: 309 | Disposition: A | Discharge status: Home / Self Care | Payer: BLUE CROSS/BLUE SHIELD | Attending: Internal Medicine | Admitting: Internal Medicine

## 2018-05-13 DIAGNOSIS — Z880 Allergy status to penicillin: Secondary | ICD-10-CM

## 2018-05-13 DIAGNOSIS — G4733 Obstructive sleep apnea (adult) (pediatric): Secondary | ICD-10-CM | POA: Diagnosis present

## 2018-05-13 DIAGNOSIS — N179 Acute kidney failure, unspecified: Secondary | ICD-10-CM | POA: Diagnosis not present

## 2018-05-13 DIAGNOSIS — Z8679 Personal history of other diseases of the circulatory system: Secondary | ICD-10-CM

## 2018-05-13 DIAGNOSIS — E669 Obesity, unspecified: Secondary | ICD-10-CM | POA: Diagnosis present

## 2018-05-13 DIAGNOSIS — I4819 Other persistent atrial fibrillation: Secondary | ICD-10-CM | POA: Diagnosis present

## 2018-05-13 DIAGNOSIS — I959 Hypotension, unspecified: Secondary | ICD-10-CM | POA: Diagnosis present

## 2018-05-13 DIAGNOSIS — Z9889 Other specified postprocedural states: Secondary | ICD-10-CM

## 2018-05-13 DIAGNOSIS — I5042 Chronic combined systolic (congestive) and diastolic (congestive) heart failure: Secondary | ICD-10-CM | POA: Diagnosis present

## 2018-05-13 DIAGNOSIS — Z9841 Cataract extraction status, right eye: Secondary | ICD-10-CM

## 2018-05-13 DIAGNOSIS — I493 Ventricular premature depolarization: Secondary | ICD-10-CM | POA: Diagnosis present

## 2018-05-13 DIAGNOSIS — Z9842 Cataract extraction status, left eye: Secondary | ICD-10-CM

## 2018-05-13 DIAGNOSIS — E785 Hyperlipidemia, unspecified: Secondary | ICD-10-CM | POA: Diagnosis present

## 2018-05-13 DIAGNOSIS — Z79899 Other long term (current) drug therapy: Secondary | ICD-10-CM

## 2018-05-13 DIAGNOSIS — I484 Atypical atrial flutter: Secondary | ICD-10-CM | POA: Diagnosis present

## 2018-05-13 DIAGNOSIS — Z8673 Personal history of transient ischemic attack (TIA), and cerebral infarction without residual deficits: Secondary | ICD-10-CM

## 2018-05-13 DIAGNOSIS — J069 Acute upper respiratory infection, unspecified: Secondary | ICD-10-CM | POA: Diagnosis not present

## 2018-05-13 DIAGNOSIS — F329 Major depressive disorder, single episode, unspecified: Secondary | ICD-10-CM | POA: Diagnosis present

## 2018-05-13 DIAGNOSIS — I471 Supraventricular tachycardia, unspecified: Secondary | ICD-10-CM | POA: Diagnosis present

## 2018-05-13 DIAGNOSIS — I21A1 Myocardial infarction type 2: Secondary | ICD-10-CM

## 2018-05-13 DIAGNOSIS — K7460 Unspecified cirrhosis of liver: Secondary | ICD-10-CM | POA: Diagnosis present

## 2018-05-13 DIAGNOSIS — I110 Hypertensive heart disease with heart failure: Secondary | ICD-10-CM | POA: Diagnosis present

## 2018-05-13 DIAGNOSIS — I10 Essential (primary) hypertension: Secondary | ICD-10-CM | POA: Diagnosis not present

## 2018-05-13 DIAGNOSIS — I2510 Atherosclerotic heart disease of native coronary artery without angina pectoris: Secondary | ICD-10-CM | POA: Diagnosis present

## 2018-05-13 DIAGNOSIS — M109 Gout, unspecified: Secondary | ICD-10-CM | POA: Diagnosis present

## 2018-05-13 DIAGNOSIS — Z9049 Acquired absence of other specified parts of digestive tract: Secondary | ICD-10-CM

## 2018-05-13 DIAGNOSIS — M1990 Unspecified osteoarthritis, unspecified site: Secondary | ICD-10-CM | POA: Diagnosis present

## 2018-05-13 DIAGNOSIS — K219 Gastro-esophageal reflux disease without esophagitis: Secondary | ICD-10-CM | POA: Diagnosis present

## 2018-05-13 DIAGNOSIS — I480 Paroxysmal atrial fibrillation: Secondary | ICD-10-CM

## 2018-05-13 DIAGNOSIS — I248 Other forms of acute ischemic heart disease: Secondary | ICD-10-CM | POA: Diagnosis present

## 2018-05-13 DIAGNOSIS — E7800 Pure hypercholesterolemia, unspecified: Secondary | ICD-10-CM | POA: Diagnosis present

## 2018-05-13 DIAGNOSIS — I340 Nonrheumatic mitral (valve) insufficiency: Secondary | ICD-10-CM | POA: Diagnosis present

## 2018-05-13 DIAGNOSIS — Z6838 Body mass index (BMI) 38.0-38.9, adult: Secondary | ICD-10-CM

## 2018-05-13 DIAGNOSIS — I272 Pulmonary hypertension, unspecified: Secondary | ICD-10-CM | POA: Diagnosis present

## 2018-05-13 DIAGNOSIS — L409 Psoriasis, unspecified: Secondary | ICD-10-CM | POA: Diagnosis present

## 2018-05-13 DIAGNOSIS — I313 Pericardial effusion (noninflammatory): Secondary | ICD-10-CM | POA: Diagnosis present

## 2018-05-13 DIAGNOSIS — Z791 Long term (current) use of non-steroidal anti-inflammatories (NSAID): Secondary | ICD-10-CM

## 2018-05-13 DIAGNOSIS — Z8249 Family history of ischemic heart disease and other diseases of the circulatory system: Secondary | ICD-10-CM

## 2018-05-13 DIAGNOSIS — Z79891 Long term (current) use of opiate analgesic: Secondary | ICD-10-CM

## 2018-05-13 LAB — CBC
HEMATOCRIT: 49.3 % (ref 39.0–52.0)
Hemoglobin: 16.2 g/dL (ref 13.0–17.0)
MCH: 30.8 pg (ref 26.0–34.0)
MCHC: 32.9 g/dL (ref 30.0–36.0)
MCV: 93.7 fL (ref 80.0–100.0)
NRBC: 0 % (ref 0.0–0.2)
Platelets: 270 10*3/uL (ref 150–400)
RBC: 5.26 MIL/uL (ref 4.22–5.81)
RDW: 12.7 % (ref 11.5–15.5)
WBC: 17.4 10*3/uL — AB (ref 4.0–10.5)

## 2018-05-13 LAB — I-STAT CHEM 8, ED
BUN: 25 mg/dL — ABNORMAL HIGH (ref 6–20)
CALCIUM ION: 1.16 mmol/L (ref 1.15–1.40)
CREATININE: 1.6 mg/dL — AB (ref 0.61–1.24)
Chloride: 107 mmol/L (ref 98–111)
GLUCOSE: 151 mg/dL — AB (ref 70–99)
HCT: 48 % (ref 39.0–52.0)
Hemoglobin: 16.3 g/dL (ref 13.0–17.0)
POTASSIUM: 4 mmol/L (ref 3.5–5.1)
Sodium: 141 mmol/L (ref 135–145)
TCO2: 22 mmol/L (ref 22–32)

## 2018-05-13 LAB — BASIC METABOLIC PANEL
ANION GAP: 12 (ref 5–15)
BUN: 23 mg/dL — ABNORMAL HIGH (ref 6–20)
CHLORIDE: 105 mmol/L (ref 98–111)
CO2: 20 mmol/L — AB (ref 22–32)
CREATININE: 1.56 mg/dL — AB (ref 0.61–1.24)
Calcium: 9.2 mg/dL (ref 8.9–10.3)
GFR calc non Af Amer: 47 mL/min — ABNORMAL LOW (ref >60)
GFR, EST AFRICAN AMERICAN: 54 mL/min — AB (ref >60)
Glucose, Bld: 152 mg/dL — ABNORMAL HIGH (ref 70–99)
Potassium: 3.8 mmol/L (ref 3.5–5.1)
SODIUM: 137 mmol/L (ref 135–145)

## 2018-05-13 LAB — INFLUENZA PANEL BY PCR (TYPE A & B)
INFLAPCR: NEGATIVE
Influenza B By PCR: NEGATIVE

## 2018-05-13 LAB — HEPARIN LEVEL (UNFRACTIONATED): Heparin Unfractionated: 0.73 IU/mL — ABNORMAL HIGH (ref 0.30–0.70)

## 2018-05-13 LAB — I-STAT TROPONIN, ED: Troponin i, poc: 0.25 ng/mL (ref 0.00–0.08)

## 2018-05-13 LAB — TROPONIN I: TROPONIN I: 1.02 ng/mL — AB (ref <0.03)

## 2018-05-13 LAB — MAGNESIUM: MAGNESIUM: 2.4 mg/dL (ref 1.7–2.4)

## 2018-05-13 LAB — TSH: TSH: 0.77 u[IU]/mL (ref 0.350–4.500)

## 2018-05-13 MED ORDER — HEPARIN BOLUS VIA INFUSION
6000.0000 [IU] | Freq: Once | INTRAVENOUS | Status: DC
  Filled 2018-05-13: qty 6000

## 2018-05-13 MED ORDER — OXYCODONE HCL 5 MG PO TABS: 15.0000 mg | ORAL_TABLET | Freq: Every day | ORAL | Status: DC | PRN

## 2018-05-13 MED ORDER — DILTIAZEM LOAD VIA INFUSION
10.0000 mg | Freq: Once | INTRAVENOUS | Status: AC
Start: 2018-05-13 — End: 2018-05-13
  Administered 2018-05-13: 10 mg via INTRAVENOUS
  Filled 2018-05-13: qty 10

## 2018-05-13 MED ORDER — ONDANSETRON HCL 4 MG/2ML IJ SOLN: 4.0000 mg | Freq: Four times a day (QID) | INTRAMUSCULAR | Status: DC | PRN

## 2018-05-13 MED ORDER — SODIUM CHLORIDE 0.9 % IV SOLN
INTRAVENOUS | Status: DC
  Administered 2018-05-13: 22:00:00 via INTRAVENOUS

## 2018-05-13 MED ORDER — ONDANSETRON HCL 4 MG PO TABS: 4.0000 mg | ORAL_TABLET | Freq: Four times a day (QID) | ORAL | Status: DC | PRN

## 2018-05-13 MED ORDER — HEPARIN (PORCINE) 25000 UT/250ML-% IV SOLN
1550.0000 [IU]/h | INTRAVENOUS | Status: DC
  Administered 2018-05-13: 1650 [IU]/h via INTRAVENOUS
  Administered 2018-05-14: 1550 [IU]/h via INTRAVENOUS
  Filled 2018-05-13 (×3): qty 250

## 2018-05-13 MED ORDER — ZOLPIDEM TARTRATE 5 MG PO TABS
5.0000 mg | ORAL_TABLET | Freq: Once | ORAL | Status: AC
  Administered 2018-05-13: 5 mg via ORAL
  Filled 2018-05-13: qty 1

## 2018-05-13 MED ORDER — CLOBETASOL PROPIONATE 0.05 % EX CREA
TOPICAL_CREAM | Freq: Two times a day (BID) | CUTANEOUS | Status: DC
  Administered 2018-05-13 – 2018-05-14 (×2): via TOPICAL
  Administered 2018-05-15: 1 via TOPICAL
  Filled 2018-05-13: qty 15

## 2018-05-13 MED ORDER — ZOLPIDEM TARTRATE 5 MG PO TABS
10.0000 mg | ORAL_TABLET | Freq: Every evening | ORAL | Status: DC | PRN
  Administered 2018-05-13: 5 mg via ORAL
  Administered 2018-05-14: 10 mg via ORAL
  Filled 2018-05-13 (×2): qty 2

## 2018-05-13 MED ORDER — ACETAMINOPHEN 650 MG RE SUPP: 650.0000 mg | Freq: Four times a day (QID) | RECTAL | Status: DC | PRN

## 2018-05-13 MED ORDER — SIMVASTATIN 40 MG PO TABS
40.0000 mg | ORAL_TABLET | Freq: Every day | ORAL | Status: DC
  Administered 2018-05-13 – 2018-05-14 (×2): 40 mg via ORAL
  Filled 2018-05-13 (×3): qty 1

## 2018-05-13 MED ORDER — SODIUM CHLORIDE 0.9 % IV BOLUS
1000.0000 mL | Freq: Once | INTRAVENOUS | Status: AC
  Administered 2018-05-13: 1000 mL via INTRAVENOUS

## 2018-05-13 MED ORDER — HEPARIN BOLUS VIA INFUSION
5500.0000 [IU] | Freq: Once | INTRAVENOUS | Status: AC
  Administered 2018-05-13: 5500 [IU] via INTRAVENOUS
  Filled 2018-05-13: qty 5500

## 2018-05-13 MED ORDER — DILTIAZEM HCL-DEXTROSE 100-5 MG/100ML-% IV SOLN (PREMIX)
5.0000 mg/h | INTRAVENOUS | Status: DC
  Administered 2018-05-13: 5 mg/h via INTRAVENOUS
  Administered 2018-05-14 (×2): 15 mg/h via INTRAVENOUS
  Filled 2018-05-13 (×3): qty 100

## 2018-05-13 MED ORDER — ACETAMINOPHEN 325 MG PO TABS
650.0000 mg | ORAL_TABLET | Freq: Four times a day (QID) | ORAL | Status: DC | PRN
  Administered 2018-05-14 – 2018-05-15 (×2): 650 mg via ORAL
  Filled 2018-05-13 (×2): qty 2

## 2018-05-13 MED ORDER — HALOBETASOL PROPIONATE 0.05 % EX CREA: 0.5000 g | TOPICAL_CREAM | Freq: Two times a day (BID) | CUTANEOUS | Status: DC

## 2018-05-13 MED ORDER — SERTRALINE HCL 50 MG PO TABS: 150.0000 mg | ORAL_TABLET | ORAL | Status: DC

## 2018-05-13 NOTE — ED Provider Notes (Signed)
57 yo M who I received in signout from Dr. Erma Heritage.  Briefly patient has a history of atrial fibrillation who is status post Maze procedure.  By history was likely in atrial fibrillation for the past week.  Initially found to be hypotensive tachycardic into the 200s.  Patient had symptomatic improvement and may have converted back to normal sinus rhythm.  He does have a positive troponin.  Cardiology was consulted and evaluated the patient who recommended hospitalist admission.   Melene Plan, DO 05/13/18 [phone]

## 2018-05-13 NOTE — ED Provider Notes (Addendum)
[facility] EMERGENCY DEPARTMENT Provider Note   CSN: [id number] Arrival date & time: 05/13/18  1409     History   Chief Complaint Chief Complaint  Patient presents with  . Chest Pain  . Weakness    HPI Joseph Garrett is a 57 y.o. male.  HPI   57 yo M with PMHx as below here with chest pain, SOB. Pt arrives in obvious distress. He reports that over the last week, he has had intermittent episodes in which she felt like he was having palpitations.  He is asked his wife to check his pulse multiple times.  This is abnormal for him.  He has a history of atrial fibrillation status post Maze procedure, and has not had episodes since then.  He does admit that he is not compliant with his anticoagulation.  He states that earlier today, he began to experience palpitations again and then felt lightheaded like he was going to pass out.  He felt short of breath.  He denies any chest pain.  He subsequent presents for further evaluation.  Denies any recent fevers or chills.  No recent medication changes.  Of note, the patient recently had a URI.  He is currently on prednisone and azithromycin.   Past Medical History:  Diagnosis Date  . CHF (congestive heart failure) (HCC)   . Chronic combined systolic and diastolic CHF (congestive heart failure) (HCC)   . Cirrhosis (HCC)    liver scarring on biopsy, presumed to be due to methotrexate  . Depression   . DJD (degenerative joint disease)   . Essential hypertension 08/30/2013  . GERD (gastroesophageal reflux disease)    not present  . Gout   . Hives   . Hypercholesteremia   . Hypertension   . Mitral regurgitation - type I dysfunction - mild to moderate   . Obesity   . OSA (obstructive sleep apnea)    mild, uses CPAP  . Paroxysmal atrial fibrillation (HCC)   . Persistent atrial fibrillation 03/02/2012  . Psoriasis   . S/P Minimally invasive maze operation for atrial fibrillation 03/20/2016   Complete bilateral atrial  lesion set using cryothermy and bipolar radiofrequency ablation via right mini thoracotomy approach with oversewing of LA appendage  . Shortness of breath dyspnea    exertion  . Syncope 02/21/2014  . Thrombocytopenia (HCC)   . TIA (transient ischemic attack)     Patient Active Problem List   Diagnosis Date Noted  . AKI (acute kidney injury) (HCC) 05/13/2018  . URI (upper respiratory infection) 05/13/2018  . SVT (supraventricular tachycardia) (HCC) 05/13/2018  . Hypotension 04/22/2016  . S/P Minimally invasive maze operation for atrial fibrillation 03/20/2016  . Mitral regurgitation - type I dysfunction - mild to moderate   . Abnormal PFTs (pulmonary function tests) 04/28/2015  . Chronic combined systolic and diastolic CHF (congestive heart failure) (HCC)   . Syncope 02/21/2014  . NICM (nonischemic cardiomyopathy) (HCC) 08/30/2013  . Sinus bradycardia 08/30/2013  . Fatigue 08/30/2013  . Essential hypertension 08/30/2013  . Persistent atrial fibrillation (HCC) 03/02/2012  . Hives   . Hypersomnia   . SOB (shortness of breath)   . Sleep apnea   . Hypercholesteremia   . Thrombocytopenia (HCC)   . OSA (obstructive sleep apnea)   . Obesity   . Hyperglycemia   . Anxiety   . Gout     Past Surgical History:  Procedure Laterality Date  . ABLATION OF DYSRHYTHMIC FOCUS  09/17/2012  . ATRIAL FIBRILLATION  ABLATION  03/17/12   PVI and CTI ablation by Dr [name]  . ATRIAL FIBRILLATION ABLATION N/A 03/17/2012   Procedure: ATRIAL FIBRILLATION ABLATION;  Surgeon: Hillis Range, MD;  Location: [facility] CATH LAB;  Service: Cardiovascular;  Laterality: N/A;  . ATRIAL FIBRILLATION ABLATION N/A 09/18/2012   Procedure: ATRIAL FIBRILLATION ABLATION;  Surgeon: Hillis Range, MD;  Location: [facility] CATH LAB;  Service: Cardiovascular;  Laterality: N/A;  . CARDIAC CATHETERIZATION N/A 01/25/2016   Procedure: Right/Left Heart Cath and Coronary Angiography;  Surgeon: Laurey Morale, MD;  Location: [facility] INVASIVE CV LAB;   Service: Cardiovascular;  Laterality: N/A;  . CARDIOVERSION N/A 10/09/2012   Procedure: CARDIOVERSION;  Surgeon: Laurey Morale, MD;  Location: [facility] ENDOSCOPY;  Service: Cardiovascular;  Laterality: N/A;  . CARDIOVERSION N/A 02/22/2014   Procedure: CARDIOVERSION;  Surgeon: Quintella Reichert, MD;  Location: [facility] ENDOSCOPY;  Service: Cardiovascular;  Laterality: N/A;  . CARDIOVERSION N/A 12/29/2015   Procedure: CARDIOVERSION;  Surgeon: Hillis Range, MD;  Location: [facility] OR;  Service: Cardiovascular;  Laterality: N/A;  . CHOLECYSTECTOMY  2011  . ELBOW ARTHROSCOPY Left 08/2014  . ELECTROPHYSIOLOGIC STUDY N/A 12/29/2015   Procedure: Cardioversion;  Surgeon: Hillis Range, MD;  Location: [facility] INVASIVE CV LAB;  Service: Cardiovascular;  Laterality: N/A;  . EYE SURGERY Bilateral 04/2015   cataract surgery  . KNEE ARTHROSCOPY Right   . MINIMALLY INVASIVE MAZE PROCEDURE N/A 03/20/2016   Procedure: MINIMALLY INVASIVE MAZE PROCEDURE;  Surgeon: Purcell Nails, MD;  Location: [facility] OR;  Service: Open Heart Surgery;  Laterality: N/A;  . NOSE SURGERY     Turbinates  . RECONSTRUCTION OF NOSE    . TEE WITHOUT CARDIOVERSION  03/16/2012   Procedure: TRANSESOPHAGEAL ECHOCARDIOGRAM (TEE);  Surgeon: [name], MD;  Location: [facility] ENDOSCOPY;  Service: Cardiovascular;  Laterality: N/A;  . TEE WITHOUT CARDIOVERSION N/A 09/17/2012   Procedure: TRANSESOPHAGEAL ECHOCARDIOGRAM (TEE);  Surgeon: Lewayne Bunting, MD;  Location: [facility] ENDOSCOPY;  Service: Cardiovascular;  Laterality: N/A;  . TEE WITHOUT CARDIOVERSION N/A 01/25/2016   Procedure: TRANSESOPHAGEAL ECHOCARDIOGRAM (TEE);  Surgeon: Laurey Morale, MD;  Location: [facility] ENDOSCOPY;  Service: Cardiovascular;  Laterality: N/A;  . TEE WITHOUT CARDIOVERSION N/A 03/20/2016   Procedure: TRANSESOPHAGEAL ECHOCARDIOGRAM (TEE);  Surgeon: Purcell Nails, MD;  Location: [facility] OR;  Service: Open Heart Surgery;  Laterality: N/A;  . TENDON REPAIR     Right Forearm  . TENDON REPAIR Right 1990's   forearm          Home Medications    Prior to Admission medications   Medication Sig Start Date End Date Taking? Authorizing Provider  diclofenac sodium (VOLTAREN) 1 % GEL Apply 4 g topically 4 (four) times daily. 02/05/18  Yes Coralee Rud, PA-C  ergocalciferol (VITAMIN D2) 1.25 MG (50000 UT) capsule Take 50,000 Units by mouth once a week. 01/18/18  Yes Coralee Rud, PA-C  halobetasol (ULTRAVATE) 0.05 % cream Apply 0.5 g topically 2 (two) times daily. 02/05/18  Yes Coralee Rud, PA-C  ibuprofen (ADVIL,MOTRIN) 200 MG tablet Take 400-800 mg by mouth every 6 (six) hours as needed for headache (or pain).   Yes [provider]  lisinopril (PRINIVIL,ZESTRIL) 5 MG tablet Take 5 mg by mouth daily. 05/11/18  Yes Coralee Rud, PA-C  meloxicam (MOBIC) 15 MG tablet Take 15 mg by mouth daily. 01/14/18  Yes Coralee Rud, PA-C  milk thistle 175 MG tablet Take 175 mg by mouth daily.   Yes [provider]  oxyCODONE (ROXICODONE)  15 MG immediate release tablet Take 15 mg by mouth daily as needed for pain. Take 1 tablet daily as needed 01/29/18  Yes Coralee Rud, PA-C  simvastatin (ZOCOR) 40 MG tablet Take 40 mg by mouth daily. 01/04/18  Yes Coralee Rud, PA-C  zolpidem (AMBIEN) 10 MG tablet Take 10 mg by mouth at bedtime as needed for sleep.   Yes [provider]  carvedilol (COREG) 3.125 MG tablet Take 1 tablet (3.125 mg) as need when BP remain above 140/90.    [provider]  oxymetazoline (AFRIN) 0.05 % nasal spray Place 2 sprays into both nostrils 2 (two) times daily as needed for congestion.    [provider]  rivaroxaban (XARELTO) 20 MG TABS tablet Take 1 tablet (20 mg total) by mouth daily with supper. Patient not taking: Reported on 04/07/2017 03/26/16   Gershon Crane E, PA-C  sertraline (ZOLOFT) 100 MG tablet Take 150 mg by mouth every morning.     [provider]    Family History Family History  Problem Relation Age of Onset  . Heart  attack Father   . Heart disease Unknown   . Diabetes Unknown     Social History Social History   Tobacco Use  . Smoking status: Never Smoker  . Smokeless tobacco: Never Used  Substance Use Topics  . Alcohol use: No  . Drug use: No     Allergies   Penicillins   Review of Systems Review of Systems  Constitutional: Positive for fatigue. Negative for chills and fever.  HENT: Negative for congestion and rhinorrhea.   Eyes: Negative for visual disturbance.  Respiratory: Positive for chest tightness and shortness of breath. Negative for cough and wheezing.   Cardiovascular: Positive for chest pain and palpitations. Negative for leg swelling.  Gastrointestinal: Negative for abdominal pain, diarrhea, nausea and vomiting.  Genitourinary: Negative for dysuria and flank pain.  Musculoskeletal: Negative for neck pain and neck stiffness.  Skin: Negative for rash and wound.  Allergic/Immunologic: Negative for immunocompromised state.  Neurological: Positive for weakness and light-headedness. Negative for syncope and headaches. Seizures:    All other systems reviewed and are negative.    Physical Exam Updated Vital Signs BP (!) 132/100 (BP Location: Left Arm)   Pulse 70   Temp (!) 97.5 F (36.4 C) (Oral)   Resp 15   Ht 6\' 2"  (1.88 m)   Wt (!) 136.4 kg   SpO2 99%   BMI 38.62 kg/m   Physical Exam  Constitutional: He is oriented to person, place, and time. He appears well-developed and well-nourished. He appears distressed.  HENT:  Head: Normocephalic and atraumatic.  Eyes: Conjunctivae are normal.  Neck: Neck supple.  Cardiovascular: Regular rhythm and normal heart sounds. Tachycardia present. Exam reveals no friction rub.  No murmur heard. Pulmonary/Chest: Effort normal and breath sounds normal. No respiratory distress. He has no wheezes. He has no rales.  Abdominal: He exhibits no distension.  Musculoskeletal: He exhibits no edema.  Neurological: He is alert and  oriented to person, place, and time. He exhibits normal muscle tone.  Skin: Skin is warm. Capillary refill takes less than 2 seconds.  Psychiatric: He has a normal mood and affect.  Nursing note and vitals reviewed.    ED Treatments / Results  Labs (all labs ordered are listed, but only abnormal results are displayed) Labs Reviewed  BASIC METABOLIC PANEL - Abnormal; Notable for the following components:      Result Value   CO2  20 (*)    Glucose, Bld 152 (*)    BUN 23 (*)    Creatinine, Ser 1.56 (*)    GFR calc non Af Amer 47 (*)    GFR calc Af Amer 54 (*)    All other components within normal limits  CBC - Abnormal; Notable for the following components:   WBC 17.4 (*)    All other components within normal limits  HEPARIN LEVEL (UNFRACTIONATED) - Abnormal; Notable for the following components:   Heparin Unfractionated 0.73 (*)    All other components within normal limits  CBC - Abnormal; Notable for the following components:   WBC 10.6 (*)    All other components within normal limits  TROPONIN I - Abnormal; Notable for the following components:   Troponin I 1.02 (*)    All other components within normal limits  TROPONIN I - Abnormal; Notable for the following components:   Troponin I 1.82 (*)    All other components within normal limits  I-STAT TROPONIN, ED - Abnormal; Notable for the following components:   Troponin i, poc 0.25 (*)    All other components within normal limits  I-STAT CHEM 8, ED - Abnormal; Notable for the following components:   BUN 25 (*)    Creatinine, Ser 1.60 (*)    Glucose, Bld 151 (*)    All other components within normal limits  RESPIRATORY PANEL BY PCR  MRSA PCR SCREENING  TSH  MAGNESIUM  INFLUENZA PANEL BY PCR (TYPE A & B)  HEPARIN LEVEL (UNFRACTIONATED)  TROPONIN I  HIV ANTIBODY (ROUTINE TESTING W REFLEX)  BASIC METABOLIC PANEL  CBC    EKG EKG Interpretation  Date/Time:  Wednesday May 13 2018 14:17:22 EST Ventricular Rate:    200 PR Interval:    QRS Duration: 83 QT Interval:  233 QTC Calculation: 425 R Axis:   -19 Text Interpretation:  Supraventricular tachycardia Borderline left axis deviation Repolarization abnormality, prob rate related Sinc elast EKG, SVT is new Diffuse St-t changes likely rate-related Confirmed by Shaune Pollack [phone]) on 05/13/2018 2:37:00 PM   Radiology Dg Chest Portable 1 View  Result Date: 05/13/2018 CLINICAL DATA:  Chest pain EXAM: PORTABLE CHEST 1 VIEW COMPARISON:  04/22/2016 CXR, chest CT 04/23/2016 FINDINGS: Chronic eventration of the right hemidiaphragm. Stable cardiomegaly with minimal aortic atherosclerosis. No pulmonary consolidation or CHF. No effusion or pneumothorax. No acute osseous appearing abnormality. IMPRESSION: No active disease. Electronically Signed   By: Tollie Eth M.D.   On: 05/13/2018 14:40    Procedures .Critical Care Performed by: Shaune Pollack, MD Authorized by: Shaune Pollack, MD   Critical care provider statement:    Critical care time (minutes):  35   Critical care time was exclusive of:  Separately billable procedures and treating other patients and teaching time   Critical care was necessary to treat or prevent imminent or life-threatening deterioration of the following conditions:  Cardiac failure, circulatory failure and respiratory failure   Critical care was time spent personally by me on the following activities:  Development of treatment plan with patient or surrogate, discussions with consultants, evaluation of patient's response to treatment, examination of patient, obtaining history from patient or surrogate, ordering and performing treatments and interventions, ordering and review of laboratory studies, ordering and review of radiographic studies, pulse oximetry, re-evaluation of patient's condition and review of old charts   I assumed direction of critical care for this patient from another provider in my specialty: no     (including  critical care time)  Medications Ordered in ED Medications  diltiazem (CARDIZEM) 1 mg/mL load via infusion 10 mg (10 mg Intravenous Bolus from Bag 05/13/18 1504)    And  diltiazem (CARDIZEM) 100 mg in dextrose 5% (1 mg/mL) infusion (15 mg/hr Intravenous New Bag/Given 05/14/18 0832)  heparin ADULT infusion 100 units/mL (25000 units/22mL sodium chloride 0.45%) (1,550 Units/hr Intravenous New Bag/Given 05/14/18 0449)  oxyCODONE (Oxy IR/ROXICODONE) immediate release tablet 15 mg (has no administration in time range)  simvastatin (ZOCOR) tablet 40 mg (40 mg Oral Given 05/13/18 2245)  zolpidem (AMBIEN) tablet 10 mg (5 mg Oral Given 05/13/18 2047)  acetaminophen (TYLENOL) tablet 650 mg (has no administration in time range)    Or  acetaminophen (TYLENOL) suppository 650 mg (has no administration in time range)  ondansetron (ZOFRAN) tablet 4 mg (has no administration in time range)    Or  ondansetron (ZOFRAN) injection 4 mg (has no administration in time range)  clobetasol cream (TEMOVATE) 0.05 % ( Topical Given 05/13/18 2246)  carvedilol (COREG) tablet 3.125 mg (3.125 mg Oral Not Given 05/14/18 0838)  sodium chloride 0.9 % bolus 1,000 mL (0 mLs Intravenous Stopped 05/13/18 1845)  heparin bolus via infusion 5,500 Units (5,500 Units Intravenous Bolus from Bag 05/13/18 1621)  zolpidem (AMBIEN) tablet 5 mg (5 mg Oral Given 05/13/18 2255)  metoprolol tartrate (LOPRESSOR) injection 5 mg (5 mg Intravenous Given 05/14/18 0054)  metoprolol tartrate (LOPRESSOR) injection 5 mg (5 mg Intravenous Given 05/14/18 0618)     Initial Impression / Assessment and Plan / ED Course  I have reviewed the triage vital signs and the nursing notes.  Pertinent labs & imaging results that were available during my care of the patient were reviewed by me and considered in my medical decision making (see chart for details).     57 year old male here with palpitations.  On arrival, patient noted to be in SVT with  rate of 200s.  Following vagal maneuvers, patient converted to what appeared to be atrial fibrillation with rapid ventricular response.  Patient given heparin given his noncompliance with Eliquis, and started on diltiazem.  He was then noted to convert to what appears to be a sinus rhythm.  I suspect this is transient arrhythmia secondary to prednisone use and recent URI.  Of note, he has a prolonged QT so should DC azithromycin and switch to doxycycline if needed.  Will consult cardiology for evaluation and recommendations regarding observation versus discharge.  Final Clinical Impressions(s) / ED Diagnoses   Final diagnoses:  SVT (supraventricular tachycardia) (HCC)  Paroxysmal atrial fibrillation [facility])    ED Discharge Orders    None       Shaune Pollack, MD 05/14/18 [id number]    Shaune Pollack, MD 05/14/18 1191    Shaune Pollack, MD 05/14/18 [phone]

## 2018-05-13 NOTE — ED Notes (Signed)
Pt keeps c/o being warm blankets given  No pain

## 2018-05-13 NOTE — H&P (Signed)
History and Physical    Joseph Garrett KGM:[id number] DOB: [birth year] DOA: 05/13/2018  PCP: No primary care provider on file.  Patient coming from: Home  I have personally briefly reviewed patient's old medical records in [facility]  Chief Complaint: CP  HPI: Joseph Garrett is a 57 y.o. male with medical history significant of difficult to control atrial fibrillation s/p ablation x 2, prior AAD therapy w/ amiodarone, ultimately required MAZE procedure and LA clip, no longer on anticoagulation, OSA compliant w/ CPAP, nonobstructive CAD by cath in 2017, HTN, HLD and GERD.  Patient presents to the ED today with c/o chest pain.  Presented in SVT with rates in the 200s, diffuse ST depressions on EKG.  Given IV cardizem with rate improvement.  Repeat EKG shows S.Tach with rate 112 and normalization of ST depressions.  Initial trop 0.25.  Patient has been unwell recently with URI symptoms.  Saw PCP and put on ABx and prednisone without much improvement.  Yesterday developed palpitations, worsened today and wife sent him in to ED.   Review of Systems: As per HPI otherwise 10 point review of systems negative.   Past Medical History:  Diagnosis Date  . CHF (congestive heart failure) (HCC)   . Chronic combined systolic and diastolic CHF (congestive heart failure) (HCC)   . Cirrhosis (HCC)    liver scarring on biopsy, presumed to be due to methotrexate  . Depression   . DJD (degenerative joint disease)   . Essential hypertension 08/30/2013  . GERD (gastroesophageal reflux disease)    not present  . Gout   . Hives   . Hypercholesteremia   . Hypertension   . Mitral regurgitation - type I dysfunction - mild to moderate   . Obesity   . OSA (obstructive sleep apnea)    mild, uses CPAP  . Paroxysmal atrial fibrillation (HCC)   . Persistent atrial fibrillation 03/02/2012  . Psoriasis   . S/P Minimally invasive maze operation for atrial fibrillation 03/20/2016   Complete bilateral  atrial lesion set using cryothermy and bipolar radiofrequency ablation via right mini thoracotomy approach with oversewing of LA appendage  . Shortness of breath dyspnea    exertion  . Syncope 02/21/2014  . Thrombocytopenia (HCC)   . TIA (transient ischemic attack)     Past Surgical History:  Procedure Laterality Date  . ABLATION OF DYSRHYTHMIC FOCUS  09/17/2012  . ATRIAL FIBRILLATION ABLATION  03/17/12   PVI and CTI ablation by Dr [name]  . ATRIAL FIBRILLATION ABLATION N/A 03/17/2012   Procedure: ATRIAL FIBRILLATION ABLATION;  Surgeon: Hillis Range, MD;  Location: [facility] CATH LAB;  Service: Cardiovascular;  Laterality: N/A;  . ATRIAL FIBRILLATION ABLATION N/A 09/18/2012   Procedure: ATRIAL FIBRILLATION ABLATION;  Surgeon: Hillis Range, MD;  Location: [facility] CATH LAB;  Service: Cardiovascular;  Laterality: N/A;  . CARDIAC CATHETERIZATION N/A 01/25/2016   Procedure: Right/Left Heart Cath and Coronary Angiography;  Surgeon: Laurey Morale, MD;  Location: [facility] INVASIVE CV LAB;  Service: Cardiovascular;  Laterality: N/A;  . CARDIOVERSION N/A 10/09/2012   Procedure: CARDIOVERSION;  Surgeon: Laurey Morale, MD;  Location: [facility] ENDOSCOPY;  Service: Cardiovascular;  Laterality: N/A;  . CARDIOVERSION N/A 02/22/2014   Procedure: CARDIOVERSION;  Surgeon: Quintella Reichert, MD;  Location: [facility] ENDOSCOPY;  Service: Cardiovascular;  Laterality: N/A;  . CARDIOVERSION N/A 12/29/2015   Procedure: CARDIOVERSION;  Surgeon: Hillis Range, MD;  Location: [facility] OR;  Service: Cardiovascular;  Laterality: N/A;  . CHOLECYSTECTOMY  2011  . ELBOW  ARTHROSCOPY Left 08/2014  . ELECTROPHYSIOLOGIC STUDY N/A 12/29/2015   Procedure: Cardioversion;  Surgeon: Hillis Range, MD;  [facility] CV LAB;  Service: Cardiovascular;  Laterality: N/A;  . EYE SURGERY Bilateral 04/2015   cataract surgery  . KNEE ARTHROSCOPY Right   . MINIMALLY INVASIVE MAZE PROCEDURE N/A 03/20/2016   Procedure: MINIMALLY INVASIVE MAZE PROCEDURE;  Surgeon: Purcell Nails, MD;  Location: [facility] OR;  Service: Open Heart Surgery;  Laterality: N/A;  . NOSE SURGERY     Turbinates  . RECONSTRUCTION OF NOSE    . TEE WITHOUT CARDIOVERSION  03/16/2012   Procedure: TRANSESOPHAGEAL ECHOCARDIOGRAM (TEE);  Surgeon: [name], MD;  Location: [facility] ENDOSCOPY;  Service: Cardiovascular;  Laterality: N/A;  . TEE WITHOUT CARDIOVERSION N/A 09/17/2012   Procedure: TRANSESOPHAGEAL ECHOCARDIOGRAM (TEE);  Surgeon: Lewayne Bunting, MD;  Location: [facility] ENDOSCOPY;  Service: Cardiovascular;  Laterality: N/A;  . TEE WITHOUT CARDIOVERSION N/A 01/25/2016   Procedure: TRANSESOPHAGEAL ECHOCARDIOGRAM (TEE);  Surgeon: Laurey Morale, MD;  Location: [facility] ENDOSCOPY;  Service: Cardiovascular;  Laterality: N/A;  . TEE WITHOUT CARDIOVERSION N/A 03/20/2016   Procedure: TRANSESOPHAGEAL ECHOCARDIOGRAM (TEE);  Surgeon: Purcell Nails, MD;  Location: [facility] OR;  Service: Open Heart Surgery;  Laterality: N/A;  . TENDON REPAIR     Right Forearm  . TENDON REPAIR Right 1990's   forearm     reports that he has never smoked. He has never used smokeless tobacco. He reports that he does not drink alcohol or use drugs.  Allergies  Allergen Reactions  . Penicillins Rash    Has patient had a PCN reaction causing immediate rash, facial/tongue/throat swelling, SOB or lightheadedness with hypotension: Yes Has patient had a PCN reaction causing severe rash involving mucus membranes or skin necrosis: No Has patient had a PCN reaction that required hospitalization No Has patient had a PCN reaction occurring within the last 10 years: No If all of the above answers are "NO", then may proceed with Cephalosporin use.     Family History  Problem Relation Age of Onset  . Heart attack Father   . Heart disease Unknown   . Diabetes Unknown      Prior to Admission medications   Medication Sig Start Date End Date Taking? Authorizing Provider  diclofenac sodium (VOLTAREN) 1 % GEL Apply 4 g topically 4 (four) times daily.  02/05/18  Yes Coralee Rud, PA-C  ergocalciferol (VITAMIN D2) 1.25 MG (50000 UT) capsule Take 50,000 Units by mouth once a week. 01/18/18  Yes Coralee Rud, PA-C  halobetasol (ULTRAVATE) 0.05 % cream Apply 0.5 g topically 2 (two) times daily. 02/05/18  Yes Coralee Rud, PA-C  ibuprofen (ADVIL,MOTRIN) 200 MG tablet Take 400-800 mg by mouth every 6 (six) hours as needed for headache (or pain).   Yes [provider]  lisinopril (PRINIVIL,ZESTRIL) 5 MG tablet Take 5 mg by mouth daily. 05/11/18  Yes Coralee Rud, PA-C  meloxicam (MOBIC) 15 MG tablet Take 15 mg by mouth daily. 01/14/18  Yes Coralee Rud, PA-C  milk thistle 175 MG tablet Take 175 mg by mouth daily.   Yes [provider]  oxyCODONE (ROXICODONE) 15 MG immediate release tablet Take 15 mg by mouth daily as needed for pain. Take 1 tablet daily as needed 01/29/18  Yes Coralee Rud, PA-C  simvastatin (ZOCOR) 40 MG tablet Take 40 mg by mouth daily. 01/04/18  Yes Coralee Rud, PA-C  zolpidem (AMBIEN) 10 MG tablet Take 10 mg  by mouth at bedtime as needed for sleep.   Yes [provider]  carvedilol (COREG) 3.125 MG tablet Take 1 tablet (3.125 mg) as need when BP remain above 140/90.    [provider]  oxymetazoline (AFRIN) 0.05 % nasal spray Place 2 sprays into both nostrils 2 (two) times daily as needed for congestion.    [provider]  rivaroxaban (XARELTO) 20 MG TABS tablet Take 1 tablet (20 mg total) by mouth daily with supper. Patient not taking: Reported on 04/07/2017 03/26/16   Gershon Crane E, PA-C  sertraline (ZOLOFT) 100 MG tablet Take 150 mg by mouth every morning.     [provider]    Physical Exam: Vitals:   05/13/18 1815 05/13/18 1830 05/13/18 1900 05/13/18 1930  BP: (!) 139/101 (!) 148/99 (!) 136/101 (!) 126/92  Pulse: (!) 114 (!) 59 (!) 116 (!) 117  Resp:      Temp:      TempSrc:      SpO2: 100% (!) 79% 97% 99%  Weight:      Height:         Constitutional: NAD, calm, comfortable Eyes: PERRL, lids and conjunctivae normal ENMT: Mucous membranes are moist. Posterior pharynx clear of any exudate or lesions.Normal dentition.  Neck: normal, supple, no masses, no thyromegaly Respiratory: clear to auscultation bilaterally, no wheezing, no crackles. Normal respiratory effort. No accessory muscle use.  Cardiovascular: Regular rate and rhythm, no murmurs / rubs / gallops. No extremity edema. 2+ pedal pulses. No carotid bruits.  Abdomen: no tenderness, no masses palpated. No hepatosplenomegaly. Bowel sounds positive.  Musculoskeletal: no clubbing / cyanosis. No joint deformity upper and lower extremities. Good ROM, no contractures. Normal muscle tone.  Skin: no rashes, lesions, ulcers. No induration Neurologic: CN 2-12 grossly intact. Sensation intact, DTR normal. Strength 5/5 in all 4.  Psychiatric: Normal judgment and insight. Alert and oriented x 3. Normal mood.    Labs on Admission: I have personally reviewed following labs and imaging studies  CBC: Recent Labs  Lab 05/13/18 1428 05/13/18 1434  WBC 17.4*  --   HGB 16.2 16.3  HCT 49.3 48.0  MCV 93.7  --   PLT 270  --    Basic Metabolic Panel: Recent Labs  Lab 05/13/18 1428 05/13/18 1434  NA 137 141  K 3.8 4.0  CL 105 107  CO2 20*  --   GLUCOSE 152* 151*  BUN 23* 25*  CREATININE 1.56* 1.60*  CALCIUM 9.2  --   MG 2.4  --    GFR: Estimated Creatinine Clearance: 74 mL/min (A) (by C-G formula based on SCr of 1.6 mg/dL (H)). Liver Function Tests: No results for input(s): AST, ALT, ALKPHOS, BILITOT, PROT, ALBUMIN in the last 168 hours. No results for input(s): LIPASE, AMYLASE in the last 168 hours. No results for input(s): AMMONIA in the last 168 hours. Coagulation Profile: No results for input(s): INR, PROTIME in the last 168 hours. Cardiac Enzymes: No results for input(s): CKTOTAL, CKMB, CKMBINDEX, TROPONINI in the last 168 hours. BNP (last 3 results) No  results for input(s): PROBNP in the last 8760 hours. HbA1C: No results for input(s): HGBA1C in the last 72 hours. CBG: No results for input(s): GLUCAP in the last 168 hours. Lipid Profile: No results for input(s): CHOL, HDL, LDLCALC, TRIG, CHOLHDL, LDLDIRECT in the last 72 hours. Thyroid Function Tests: No results for input(s): TSH, T4TOTAL, FREET4, T3FREE, THYROIDAB in the last 72 hours. Anemia Panel: No results for input(s): VITAMINB12, FOLATE,  FERRITIN, TIBC, IRON, RETICCTPCT in the last 72 hours. Urine analysis:    Component Value Date/Time   COLORURINE AMBER (A) 04/22/2016 0018   APPEARANCEUR CLOUDY (A) 04/22/2016 0018   LABSPEC 1.017 04/22/2016 0018   PHURINE 6.5 04/22/2016 0018   GLUCOSEU NEGATIVE 04/22/2016 0018   HGBUR NEGATIVE 04/22/2016 0018   BILIRUBINUR SMALL (A) 04/22/2016 0018   KETONESUR NEGATIVE 04/22/2016 0018   PROTEINUR NEGATIVE 04/22/2016 0018   NITRITE NEGATIVE 04/22/2016 0018   LEUKOCYTESUR NEGATIVE 04/22/2016 0018    Radiological Exams on Admission: Dg Chest Portable 1 View  Result Date: 05/13/2018 CLINICAL DATA:  Chest pain EXAM: PORTABLE CHEST 1 VIEW COMPARISON:  04/22/2016 CXR, chest CT 04/23/2016 FINDINGS: Chronic eventration of the right hemidiaphragm. Stable cardiomegaly with minimal aortic atherosclerosis. No pulmonary consolidation or CHF. No effusion or pneumothorax. No acute osseous appearing abnormality. IMPRESSION: No active disease. Electronically Signed   By: Tollie Eth M.D.   On: 05/13/2018 14:40    EKG: Independently reviewed.  Assessment/Plan Principal Problem:   SVT (supraventricular tachycardia) (HCC) Active Problems:   OSA (obstructive sleep apnea)   Persistent atrial fibrillation (HCC)   Essential hypertension   S/P Minimally invasive maze operation for atrial fibrillation   AKI (acute kidney injury) (HCC)   URI (upper respiratory infection)    1. SVT - 1. Rate improved on cardizem 2. Cont cardizem gtt 3. See cards  consult 4. Mg WNL 5. TSH pending still 2. URI - 1. resp virus pnl pending 2. CXR neg, repeat in AM after IV hydration 3. Watch for objective fever 4. Avoid NSAIDs due to AKI 3. AKI - 1. Holding home lisinopril 2. IVF: NS at 100, 1L bolus in ED 3. Repeat BMP IN am 4. Intake and output 4. Elevated trop - 1. Serial trops 2. Tele monitor 3. Likely demand ischemia 5. OSA - cont CPAP 6. H/o A.Fib - 1. Cont IV heparin for now, no longer on AC as outpt apparently due to cost? 2. Convert to PO cardizem tomorrow 3. CM to discuss anticoagulant cost options with patient  DVT prophylaxis: heparin gtt Code Status: Full Family Communication: No family in room Disposition Plan: Home after admit Consults called: Cards Admission status: Place in obs   Hillary Bow. DO [facility] Pager [phone] Only works nights!  If 7AM-7PM, please contact the primary day team physician taking care of patient  [email or web address] Password [email or web address]  05/13/2018, 8:05 PM

## 2018-05-13 NOTE — Discharge Instructions (Addendum)
STOP taking Azithromycin as this could cause recurrent arrhythmia

## 2018-05-13 NOTE — ED Triage Notes (Signed)
Pt presents for evaluation of chest pain and generalized weakness starting last night with worsening today. Hx of afib with procedural correction 2 years ago. States not on blood thinner and no episodes of Afib since procedure.

## 2018-05-13 NOTE — Progress Notes (Signed)
ANTICOAGULATION CONSULT NOTE   Pharmacy Consult for Heparin Indication: atrial fibrillation  Allergies  Allergen Reactions  . Penicillins Rash    Has patient had a PCN reaction causing immediate rash, facial/tongue/throat swelling, SOB or lightheadedness with hypotension: Yes Has patient had a PCN reaction causing severe rash involving mucus membranes or skin necrosis: No Has patient had a PCN reaction that required hospitalization No Has patient had a PCN reaction occurring within the last 10 years: No If all of the above answers are "NO", then may proceed with Cephalosporin use.     Patient Measurements: Height: 6\' 2"  (188 cm) Weight: (!) 300 lb 12.8 oz (136.4 kg) IBW/kg (Calculated) : 82.2 Total Body Weight per patient: 140 kg Height per patient: 6' 2.5" Ideal Body Weight: 83.4 kg Heparin Dosing Weight: 115 kg  Vital Signs: Temp: 97.6 F (36.4 C) (11/13 2137) Temp Source: Oral (11/13 2137) BP: 126/92 (11/13 1930) Pulse Rate: 117 (11/13 1930)  Labs: Recent Labs    05/13/18 1428 05/13/18 1434 05/13/18 1946 05/13/18 2134  HGB 16.2 16.3  --   --   HCT 49.3 48.0  --   --   PLT 270  --   --   --   HEPARINUNFRC  --   --   --  0.73*  CREATININE 1.56* 1.60*  --   --   TROPONINI  --   --  1.02*  --     Estimated Creatinine Clearance: 73.1 mL/min (A) (by C-G formula based on SCr of 1.6 mg/dL (H)).   Medical History: Past Medical History:  Diagnosis Date  . CHF (congestive heart failure) (HCC)   . Chronic combined systolic and diastolic CHF (congestive heart failure) (HCC)   . Cirrhosis (HCC)    liver scarring on biopsy, presumed to be due to methotrexate  . Depression   . DJD (degenerative joint disease)   . Essential hypertension 08/30/2013  . GERD (gastroesophageal reflux disease)    not present  . Gout   . Hives   . Hypercholesteremia   . Hypertension   . Mitral regurgitation - type I dysfunction - mild to moderate   . Obesity   . OSA (obstructive sleep  apnea)    mild, uses CPAP  . Paroxysmal atrial fibrillation (HCC)   . Persistent atrial fibrillation 03/02/2012  . Psoriasis   . S/P Minimally invasive maze operation for atrial fibrillation 03/20/2016   Complete bilateral atrial lesion set using cryothermy and bipolar radiofrequency ablation via right mini thoracotomy approach with oversewing of LA appendage  . Shortness of breath dyspnea    exertion  . Syncope 02/21/2014  . Thrombocytopenia (HCC)   . TIA (transient ischemic attack)      Assessment: Patient 79 yoM presenting with chest pain. Pharmacy has been consulted for heparin dosing. No anticoagulation PTA.   Initial heparin level 0.73 units/ml  Goal of Therapy:  Heparin level 0.3-0.7 units/ml Monitor platelets by anticoagulation protocol: Yes   Plan:  Decrease heparin to 1550 units/hr Check heparin level in 6-8 hours  Monitor for s/sx of bleed  Thanks for allowing pharmacy to be a part of this patient's care.  Talbert Cage, PharmD Clinical Pharmacist

## 2018-05-13 NOTE — Progress Notes (Addendum)
ANTICOAGULATION CONSULT NOTE - Initial Consult  Pharmacy Consult for Heparin Indication: atrial fibrillation  Allergies  Allergen Reactions  . Penicillins Rash    Has patient had a PCN reaction causing immediate rash, facial/tongue/throat swelling, SOB or lightheadedness with hypotension: Yes Has patient had a PCN reaction causing severe rash involving mucus membranes or skin necrosis: No Has patient had a PCN reaction that required hospitalization No Has patient had a PCN reaction occurring within the last 10 years: No If all of the above answers are "NO", then may proceed with Cephalosporin use.     Patient Measurements:   Total Body Weight per patient: 140 kg Height per patient: 6' 2.5" Ideal Body Weight: 83.4 kg Heparin Dosing Weight: 115 kg  Vital Signs: Temp: 97.4 F (36.3 C) (11/13 1425) Temp Source: Oral (11/13 1425) BP: 102/76 (11/13 1425) Pulse Rate: 127 (11/13 1425)  Labs: Recent Labs    05/13/18 1434  HGB 16.3  HCT 48.0  CREATININE 1.60*    CrCl cannot be calculated (Unknown ideal weight.).   Medical History: Past Medical History:  Diagnosis Date  . CHF (congestive heart failure) (HCC)   . Chronic combined systolic and diastolic CHF (congestive heart failure) (HCC)   . Cirrhosis (HCC)    liver scarring on biopsy, presumed to be due to methotrexate  . Depression   . DJD (degenerative joint disease)   . Essential hypertension 08/30/2013  . GERD (gastroesophageal reflux disease)    not present  . Gout   . Hives   . Hypercholesteremia   . Hypertension   . Mitral regurgitation - type I dysfunction - mild to moderate   . Obesity   . OSA (obstructive sleep apnea)    mild, uses CPAP  . Paroxysmal atrial fibrillation (HCC)   . Persistent atrial fibrillation 03/02/2012  . Psoriasis   . S/P Minimally invasive maze operation for atrial fibrillation 03/20/2016   Complete bilateral atrial lesion set using cryothermy and bipolar radiofrequency ablation via  right mini thoracotomy approach with oversewing of LA appendage  . Shortness of breath dyspnea    exertion  . Syncope 02/21/2014  . Thrombocytopenia (HCC)   . TIA (transient ischemic attack)      Assessment: Patient 81 yoM presenting with chest pain. Pharmacy has been consulted for heparin dosing. No anticoagulation PTA. CBC pending.  Will bolus on lower end given history of thrombocytopenia.  Goal of Therapy:  Heparin level 0.3-0.7 units/ml Monitor platelets by anticoagulation protocol: Yes   Plan:  Give 5500 units bolus x 1 Start heparin infusion at 1650 units/hr Check anti-Xa level in 6 hours and daily while on heparin Continue to monitor H&H and platelets  Monitor for s/sx of bleed  Chauncey Mann, Pharmacy Student

## 2018-05-13 NOTE — Consult Note (Addendum)
Cardiology Consultation:   Patient ID: Joseph Garrett MRN: [id number]; DOB: July 22, 1960  Admit date: 05/13/2018 Date of Consult: 05/13/2018  Primary Care Provider: No primary care provider on file. Primary Cardiologist: Hillis Range, MD  Primary Electrophysiologist:  Hillis Range, MD    Patient Profile:   Joseph Garrett is a 57 y.o. male with a hx of difficult to control atrial fibrillation s/p ablation x 2, prior AAD therapy w/ amiodarone, ultimately required MAZE procedure and LA clip, no longer on anticoagulation, OSA compliant w/ CPAP, nonobstructive CAD by cath in 2017, HTN, HLD and GERD,  who is being seen today for the evaluation of CP at the request of Dr. Erma Heritage, Emergency Medicine.   History of Present Illness:   Joseph Garrett presented to the [facility] ED this afternoon w/ CC of chest pain. He appeared to in SVT w/ rates in the 200s. Diffuse ST depressions were noted on EKG. He was given IV Cardizem and rate improved. Repeat EKG showed sinus tach w/ rate of 112 bpm and normalization of ST depressions. Initial POC troponin abnormal at 0.25. He is now on IV Cardizem drip and IV heparin. Additional labs notable for elevated WBC at 17.4. Also AKI with SCr at 1.60 (prior baseline ~1.1). K normal at 4.0. Afebrile. CXR shows no PNA. No CHF.   He reports recent h/o URI. He saw his PCP and was placed on antibiotics and prednisone. Not much improvement with treatment. Overall just felt unwell. Yesterday, he developed palpitations. He felt like he was back in afib. Condition worsened today and his wife advised that he come into the ED. Enroute, he vision got blurry and he was lightheaded. He also had substernal chest discomfort, pressure, with radiation to both arms He was found to be in SVT and started on IV Cardizem. HR improved and his CP resolved. Currently asymptomatic.   Of note, pt also mentions that he had a slow pulse rate in the 40s last week. He was noted to have frequent PVCs on tele.    He currently feels feverish.   Past Medical History:  Diagnosis Date  . CHF (congestive heart failure) (HCC)   . Chronic combined systolic and diastolic CHF (congestive heart failure) (HCC)   . Cirrhosis (HCC)    liver scarring on biopsy, presumed to be due to methotrexate  . Depression   . DJD (degenerative joint disease)   . Essential hypertension 08/30/2013  . GERD (gastroesophageal reflux disease)    not present  . Gout   . Hives   . Hypercholesteremia   . Hypertension   . Mitral regurgitation - type I dysfunction - mild to moderate   . Obesity   . OSA (obstructive sleep apnea)    mild, uses CPAP  . Paroxysmal atrial fibrillation (HCC)   . Persistent atrial fibrillation 03/02/2012  . Psoriasis   . S/P Minimally invasive maze operation for atrial fibrillation 03/20/2016   Complete bilateral atrial lesion set using cryothermy and bipolar radiofrequency ablation via right mini thoracotomy approach with oversewing of LA appendage  . Shortness of breath dyspnea    exertion  . Syncope 02/21/2014  . Thrombocytopenia (HCC)   . TIA (transient ischemic attack)     Past Surgical History:  Procedure Laterality Date  . ABLATION OF DYSRHYTHMIC FOCUS  09/17/2012  . ATRIAL FIBRILLATION ABLATION  03/17/12   PVI and CTI ablation by Dr [name]  . ATRIAL FIBRILLATION ABLATION N/A 03/17/2012   Procedure: ATRIAL FIBRILLATION ABLATION;  Surgeon: Hillis Range,  MD;  Location: [facility] CATH LAB;  Service: Cardiovascular;  Laterality: N/A;  . ATRIAL FIBRILLATION ABLATION N/A 09/18/2012   Procedure: ATRIAL FIBRILLATION ABLATION;  Surgeon: Hillis Range, MD;  Location: [facility] CATH LAB;  Service: Cardiovascular;  Laterality: N/A;  . CARDIAC CATHETERIZATION N/A 01/25/2016   Procedure: Right/Left Heart Cath and Coronary Angiography;  Surgeon: Laurey Morale, MD;  Location: [facility] INVASIVE CV LAB;  Service: Cardiovascular;  Laterality: N/A;  . CARDIOVERSION N/A 10/09/2012   Procedure: CARDIOVERSION;  Surgeon: Laurey Morale, MD;  Location: [facility] ENDOSCOPY;  Service: Cardiovascular;  Laterality: N/A;  . CARDIOVERSION N/A 02/22/2014   Procedure: CARDIOVERSION;  Surgeon: Quintella Reichert, MD;  Location: [facility] ENDOSCOPY;  Service: Cardiovascular;  Laterality: N/A;  . CARDIOVERSION N/A 12/29/2015   Procedure: CARDIOVERSION;  Surgeon: Hillis Range, MD;  Location: [facility] OR;  Service: Cardiovascular;  Laterality: N/A;  . CHOLECYSTECTOMY  2011  . ELBOW ARTHROSCOPY Left 08/2014  . ELECTROPHYSIOLOGIC STUDY N/A 12/29/2015   Procedure: Cardioversion;  Surgeon: Hillis Range, MD;  Location: [facility] INVASIVE CV LAB;  Service: Cardiovascular;  Laterality: N/A;  . EYE SURGERY Bilateral 04/2015   cataract surgery  . KNEE ARTHROSCOPY Right   . MINIMALLY INVASIVE MAZE PROCEDURE N/A 03/20/2016   Procedure: MINIMALLY INVASIVE MAZE PROCEDURE;  Surgeon: Purcell Nails, MD;  Location: [facility] OR;  Service: Open Heart Surgery;  Laterality: N/A;  . NOSE SURGERY     Turbinates  . RECONSTRUCTION OF NOSE    . TEE WITHOUT CARDIOVERSION  03/16/2012   Procedure: TRANSESOPHAGEAL ECHOCARDIOGRAM (TEE);  Surgeon: [name], MD;  Location: [facility] ENDOSCOPY;  Service: Cardiovascular;  Laterality: N/A;  . TEE WITHOUT CARDIOVERSION N/A 09/17/2012   Procedure: TRANSESOPHAGEAL ECHOCARDIOGRAM (TEE);  Surgeon: Lewayne Bunting, MD;  Location: [facility] ENDOSCOPY;  Service: Cardiovascular;  Laterality: N/A;  . TEE WITHOUT CARDIOVERSION N/A 01/25/2016   Procedure: TRANSESOPHAGEAL ECHOCARDIOGRAM (TEE);  Surgeon: Laurey Morale, MD;  Location: [facility] ENDOSCOPY;  Service: Cardiovascular;  Laterality: N/A;  . TEE WITHOUT CARDIOVERSION N/A 03/20/2016   Procedure: TRANSESOPHAGEAL ECHOCARDIOGRAM (TEE);  Surgeon: Purcell Nails, MD;  Location: [facility] OR;  Service: Open Heart Surgery;  Laterality: N/A;  . TENDON REPAIR     Right Forearm  . TENDON REPAIR Right 1990's   forearm     Home Medications:  Prior to Admission medications   Medication Sig Start Date End Date Taking? Authorizing Provider   diclofenac sodium (VOLTAREN) 1 % GEL Apply 4 g topically 4 (four) times daily. 02/05/18  Yes Coralee Rud, PA-C  ergocalciferol (VITAMIN D2) 1.25 MG (50000 UT) capsule Take 50,000 Units by mouth once a week. 01/18/18  Yes Coralee Rud, PA-C  halobetasol (ULTRAVATE) 0.05 % cream Apply 0.5 g topically 2 (two) times daily. 02/05/18  Yes Coralee Rud, PA-C  ibuprofen (ADVIL,MOTRIN) 200 MG tablet Take 400-800 mg by mouth every 6 (six) hours as needed for headache (or pain).   Yes [provider]  lisinopril (PRINIVIL,ZESTRIL) 5 MG tablet Take 5 mg by mouth daily. 05/11/18  Yes Coralee Rud, PA-C  meloxicam (MOBIC) 15 MG tablet Take 15 mg by mouth daily. 01/14/18  Yes Coralee Rud, PA-C  milk thistle 175 MG tablet Take 175 mg by mouth daily.   Yes [provider]  oxyCODONE (ROXICODONE) 15 MG immediate release tablet Take 15 mg by mouth daily as needed for pain. Take 1 tablet daily as needed 01/29/18  Yes Coralee Rud, PA-C  simvastatin (ZOCOR) 40 MG tablet Take  40 mg by mouth daily. 01/04/18  Yes Coralee Rud, PA-C  zolpidem (AMBIEN) 10 MG tablet Take 10 mg by mouth at bedtime as needed for sleep.   Yes [provider]  carvedilol (COREG) 3.125 MG tablet Take 1 tablet (3.125 mg) as need when BP remain above 140/90.    [provider]  oxyCODONE (OXY IR/ROXICODONE) 5 MG immediate release tablet Take 1-2 tablets (5-10 mg total) by mouth every 6 (six) hours as needed for severe pain. Patient not taking: Reported on 05/13/2018 04/08/16   Purcell Nails, MD  oxymetazoline (AFRIN) 0.05 % nasal spray Place 2 sprays into both nostrils 2 (two) times daily as needed for congestion.    [provider]  rivaroxaban (XARELTO) 20 MG TABS tablet Take 1 tablet (20 mg total) by mouth daily with supper. Patient not taking: Reported on 04/07/2017 03/26/16   Gershon Crane E, PA-C  sertraline (ZOLOFT) 100 MG tablet Take 150 mg by mouth every morning.     [provider]  simvastatin (ZOCOR) 20 MG tablet TAKE 1 TABLET BY MOUTH EVERY DAY Patient not taking: Reported on 05/13/2018 01/23/17   Hillis Range, MD    Inpatient Medications: Scheduled Meds:  Continuous Infusions: . diltiazem (CARDIZEM) infusion 5 mg/hr (05/13/18 1503)  . heparin 1,650 Units/hr (05/13/18 1623)   PRN Meds:   Allergies:    Allergies  Allergen Reactions  . Penicillins Rash    Has patient had a PCN reaction causing immediate rash, facial/tongue/throat swelling, SOB or lightheadedness with hypotension: Yes Has patient had a PCN reaction causing severe rash involving mucus membranes or skin necrosis: No Has patient had a PCN reaction that required hospitalization No Has patient had a PCN reaction occurring within the last 10 years: No If all of the above answers are "NO", then may proceed with Cephalosporin use.     Social History:   Social History   Socioeconomic History  . Marital status: Married    Spouse name: Not on file  . Number of children: Not on file  . Years of education: Not on file  . Highest education level: Not on file  Occupational History  . Not on file  Social Needs  . Financial resource strain: Not on file  . Food insecurity:    Worry: Not on file    Inability: Not on file  . Transportation needs:    Medical: Not on file    Non-medical: Not on file  Tobacco Use  . Smoking status: Never Smoker  . Smokeless tobacco: Never Used  Substance and Sexual Activity  . Alcohol use: No  . Drug use: No  . Sexual activity: Not on file  Lifestyle  . Physical activity:    Days per week: Not on file    Minutes per session: Not on file  . Stress: Not on file  Relationships  . Social connections:    Talks on phone: Not on file    Gets together: Not on file    Attends religious service: Not on file    Active member of club or organization: Not on file    Attends meetings of clubs or organizations: Not on file    Relationship status: Not  on file  . Intimate partner violence:    Fear of current or ex partner: Not on file    Emotionally abused: Not on file    Physically abused: Not on file    Forced sexual activity: Not on file  Other  Topics Concern  . Not on file  Social History Narrative   Lives in [place] with spouse.  3 children are healthy.     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    Family History:    Family History  Problem Relation Age of Onset  . Heart attack Father   . Heart disease Unknown   . Diabetes Unknown      ROS:  Please see the history of present illness.   All other ROS reviewed and negative.     Physical Exam/Data:   Vitals:   05/13/18 1430 05/13/18 1445 05/13/18 1500 05/13/18 1530  BP: 99/75 114/85 103/70 99/68  Pulse: (!) 113 94 (!) 112 (!) 225  Resp: 14 15 17 17   Temp:      TempSrc:      SpO2: 100% 98% 100% 98%  Weight:   (!) 139.7 kg   Height:   6\' 2"  (1.88 m)    No intake or output data in the 24 hours ending 05/13/18 1634 Filed Weights   05/13/18 1500  Weight: (!) 139.7 kg   Body mass index is 39.54 kg/m.  General:  Obese WM, Well nourished, well developed, in no acute distress HEENT: normal Lymph: no adenopathy Neck: no JVD Endocrine:  No thryomegaly Vascular: No carotid bruits; FA pulses 2+ bilaterally without bruits  Cardiac:  normal S1, S2; RRR; no murmur  Lungs: diffuse rhonchi and some wheezing Abd: soft, nontender, no hepatomegaly  Ext: no edema Musculoskeletal:  No deformities, BUE and BLE strength normal and equal Skin: warm and dry  Neuro:  CNs 2-12 intact, no focal abnormalities noted Psych:  Normal affect   EKG:  The EKG was personally reviewed and demonstrates:  SVT, NSR after cardizem Telemetry:  Telemetry was personally reviewed and demonstrates:  SVT>> NSR currently.   Relevant CV Studies:  Procedures   Right/Left Heart Cath and Coronary Angiography 01/25/16  Conclusion   1. Normal filling pressures.  2.  Nonobstructive coronary disease.  3. EF 50-55%.    Coronary Findings   Diagnostic  Dominance: Right  Left Main  30% distal LM stenosis.  Left Anterior Descending  Luminal irregularities.  Ramus Intermedius  Large, no significant disease.  Left Circumflex  No significant disease.  Right Coronary Artery  No significant disease.   2D echo 06/04/16 Study Conclusions  - Left ventricle: LV longitudinal lV strain is abnormal at -12%.   The cavity size was normal. Systolic function was normal. The   estimated ejection fraction was in the range of 50% to 55%.   Diffuse hypokinesis. - Aortic valve: Trileaflet; mildly thickened leaflets. - Right ventricle: Systolic function was moderately reduced. - Pulmonary arteries: PA peak pressure: 51 mm Hg (S). - Pericardium, extracardiac: A small, free-flowing pericardial   effusion was identified along the right ventricular free wall.   The fluid had no internal echoes.There was no evidence of   hemodynamic compromise.  Impressions:  - The right ventricular systolic pressure was increased consistent   with moderate pulmonary hypertension.  Laboratory Data:  Chemistry Recent Labs  Lab 05/13/18 1428 05/13/18 1434  NA 137 141  K 3.8 4.0  CL 105 107  CO2 20*  --   GLUCOSE 152* 151*  BUN 23* 25*  CREATININE 1.56* 1.60*  CALCIUM 9.2  --   GFRNONAA 47*  --   GFRAA 54*  --   ANIONGAP 12  --     No results for input(s): PROT, ALBUMIN, AST,  ALT, ALKPHOS, BILITOT in the last 168 hours. Hematology Recent Labs  Lab 05/13/18 1428 05/13/18 1434  WBC 17.4*  --   RBC 5.26  --   HGB 16.2 16.3  HCT 49.3 48.0  MCV 93.7  --   MCH 30.8  --   MCHC 32.9  --   RDW 12.7  --   PLT 270  --    Cardiac EnzymesNo results for input(s): TROPONINI in the last 168 hours.  Recent Labs  Lab 05/13/18 1432  TROPIPOC 0.25*    BNPNo results for input(s): BNP, PROBNP in the last 168 hours.  DDimer No results for input(s): DDIMER in the last 168  hours.  Radiology/Studies:  Dg Chest Portable 1 View  Result Date: 05/13/2018 CLINICAL DATA:  Chest pain EXAM: PORTABLE CHEST 1 VIEW COMPARISON:  04/22/2016 CXR, chest CT 04/23/2016 FINDINGS: Chronic eventration of the right hemidiaphragm. Stable cardiomegaly with minimal aortic atherosclerosis. No pulmonary consolidation or CHF. No effusion or pneumothorax. No acute osseous appearing abnormality. IMPRESSION: No active disease. Electronically Signed   By: Tollie Eth M.D.   On: 05/13/2018 14:40    Assessment and Plan:   ZAVIAN SLOWEY is a 57 y.o. male with a hx of difficult to control atrial fibrillation s/p ablation x 2, AAD therapy w/ amiodarone, ultimately required MAZE procedure and LA clip, no longer on anticoagulation, nonobstructive CAD by cath in 2017, HTN, HLD and GERD,  who is being seen today for the evaluation of CP at the request of Dr. Erma Heritage, Emergency Medicine.   1. SVT: initial rates in the 200s. Improved with IV Cardizem. Now asymptomatic. K is WNL. ? If triggered by URI. Also check TSH and Mg. Will continue IV Cardizem for now. Recommend overnight observation on tele. Will need EP consult tomorrow morning. Of note, pt also noted that he had low pulse rates at home last week. PVCs noted on tele. Will continue to monitor. He may need updated echo.   2. URI: pt notes recent outpatient treatment with antibiotics and prednisone. He notes little improvement in symptoms. His WBC is elevated at 17. This may be 2/2 recent prednisone. His lung exam is abnormal with diffuse rhonchi and wheezing. Pt also notes subjective fever. Recommend hospitalist admission and further management.   3. AKI: Scr 1.60. This may be 2/2 SVT affecting renal profusion. Hold home lisinopril. Hydrate with IVFs. Repeat BMP in the AM.   4. Elevated Troponin: POC troponin abnormal at 0.25. He also had chest pressure but suspect demand ischemia 2/2 SVT with rates in the 200s. His CP has since resolved with rate  control. Continue to cycle troponins to ensure no significant increase in level.   5. OSA: continue CPAP QHS.  6. H/o Afib: s/p MAZE and LA clip. No longer on a/c as outpatient. Ok to continue IV heparin for now. Continue IV Cardizem and likely transition to PO tomorrow. We will also get EPs input tomorrow morning given his SVT.    For questions or updates, please contact [facility] Please consult [email or web address] for contact info under     Signed, Joseph Lis, PA-C  05/13/2018 4:34 PM  Personally seen and examined. Agree with above.  57 year old male with atriopathy status post Maze procedure left atrial clip maintaining sinus rhythm up until recently with upper respiratory infection on antibiotics with prednisone use subjectively febrile who presented to the emergency room after feeling near syncope dizziness, "white out ".  His heart rate was 225 bpm originally.  EKG  shows ST segment changes, P wave in between QRS, regular rhythm, narrow complex, likely SVT.  On monitor currently he is having occasional PVC.  Earlier this week he stated that his heart rate was "40 ".  Right now laying in bed is feeling febrile.  Wife present. No current chest pain.  Most recent ECG shows 2-1 atrial flutter upright in V1.  GEN: Well nourished, well developed, in no acute distress  HEENT: normal  Neck: no JVD, carotid bruits, or masses Cardiac: RRR with occasional ectopy; no murmurs, rubs, or gallops,no edema  Respiratory:  clear to auscultation bilaterally, normal work of breathing GI: soft, nontender, nondistended, + BS MS: no deformity or atrophy  Skin: warm and dry, no rash Neuro:  Alert and Oriented x 3, Strength and sensation are intact Psych: euthymic mood, full affect  Labs: Troponin point-of-care 0.25.  Creatinine 1.6.  Potassium 4.0.  Magnesium 2.4.  Hemoglobin 16.3.  EKG on repeat shows sinus rhythm without any ST segment changes.  Personally reviewed and  interpreted.  Assessment and plan:  PSVT/near syncope/paroxysmal atrial flutter - IV Cardizem utilized and rate improved.  I am unsure if this was an abrupt resolution.  Secondary EKG appears to be 2 to 1 atrial flutter upright in V1.  Appears fairly isoelectric in the inferior leads. - Currently on IV heparin drip.  We will continue this.  Likely will transition to Xarelto once again even with his left atrial appendage clipping. -For now continue with low-dose IV diltiazem. -May once again require antiarrhythmic therapy.  Certainly his upper respiratory infection likely has driven him into this arrhythmia. -We will consult EP/Dr. Allred for further antiarrhythmic therapy.  Flutter does not appear typical.  Elevated troponin - Likely type II myocardial infarction/demand ischemia in the setting of his tachyarrhythmia.  Upper respiratory infection - Recent prednisone and antibiotic use.  Per primary team.  Leukocytosis noted.  May be in part prednisone related.  Currently feeling febrile.  Chest x-ray shows no active disease.  Donato Schultz, MD

## 2018-05-13 NOTE — Progress Notes (Signed)
Pt placed on CPAP with pressure of 12 as per patient for home settings.  Pt using nasal mask and tolerating well.

## 2018-05-14 ENCOUNTER — Encounter (HOSPITAL_COMMUNITY): Payer: Self-pay

## 2018-05-14 ENCOUNTER — Other Ambulatory Visit: Payer: Self-pay

## 2018-05-14 ENCOUNTER — Observation Stay (HOSPITAL_COMMUNITY): Payer: BLUE CROSS/BLUE SHIELD

## 2018-05-14 DIAGNOSIS — I471 Supraventricular tachycardia: Principal | ICD-10-CM

## 2018-05-14 DIAGNOSIS — I4819 Other persistent atrial fibrillation: Secondary | ICD-10-CM | POA: Diagnosis present

## 2018-05-14 DIAGNOSIS — I340 Nonrheumatic mitral (valve) insufficiency: Secondary | ICD-10-CM | POA: Diagnosis present

## 2018-05-14 DIAGNOSIS — I10 Essential (primary) hypertension: Secondary | ICD-10-CM | POA: Diagnosis not present

## 2018-05-14 DIAGNOSIS — Z8673 Personal history of transient ischemic attack (TIA), and cerebral infarction without residual deficits: Secondary | ICD-10-CM | POA: Diagnosis not present

## 2018-05-14 DIAGNOSIS — Z8249 Family history of ischemic heart disease and other diseases of the circulatory system: Secondary | ICD-10-CM | POA: Diagnosis not present

## 2018-05-14 DIAGNOSIS — E669 Obesity, unspecified: Secondary | ICD-10-CM | POA: Diagnosis present

## 2018-05-14 DIAGNOSIS — Z6838 Body mass index (BMI) 38.0-38.9, adult: Secondary | ICD-10-CM | POA: Diagnosis not present

## 2018-05-14 DIAGNOSIS — I959 Hypotension, unspecified: Secondary | ICD-10-CM | POA: Diagnosis present

## 2018-05-14 DIAGNOSIS — I4891 Unspecified atrial fibrillation: Secondary | ICD-10-CM | POA: Diagnosis not present

## 2018-05-14 DIAGNOSIS — Z791 Long term (current) use of non-steroidal anti-inflammatories (NSAID): Secondary | ICD-10-CM | POA: Diagnosis not present

## 2018-05-14 DIAGNOSIS — G4733 Obstructive sleep apnea (adult) (pediatric): Secondary | ICD-10-CM | POA: Diagnosis present

## 2018-05-14 DIAGNOSIS — I313 Pericardial effusion (noninflammatory): Secondary | ICD-10-CM | POA: Diagnosis present

## 2018-05-14 DIAGNOSIS — Z9889 Other specified postprocedural states: Secondary | ICD-10-CM | POA: Diagnosis not present

## 2018-05-14 DIAGNOSIS — E785 Hyperlipidemia, unspecified: Secondary | ICD-10-CM | POA: Diagnosis present

## 2018-05-14 DIAGNOSIS — I110 Hypertensive heart disease with heart failure: Secondary | ICD-10-CM | POA: Diagnosis present

## 2018-05-14 DIAGNOSIS — K219 Gastro-esophageal reflux disease without esophagitis: Secondary | ICD-10-CM | POA: Diagnosis present

## 2018-05-14 DIAGNOSIS — M109 Gout, unspecified: Secondary | ICD-10-CM | POA: Diagnosis present

## 2018-05-14 DIAGNOSIS — Z8679 Personal history of other diseases of the circulatory system: Secondary | ICD-10-CM | POA: Diagnosis not present

## 2018-05-14 DIAGNOSIS — Z880 Allergy status to penicillin: Secondary | ICD-10-CM | POA: Diagnosis not present

## 2018-05-14 DIAGNOSIS — Z9049 Acquired absence of other specified parts of digestive tract: Secondary | ICD-10-CM | POA: Diagnosis not present

## 2018-05-14 DIAGNOSIS — K7460 Unspecified cirrhosis of liver: Secondary | ICD-10-CM | POA: Diagnosis present

## 2018-05-14 DIAGNOSIS — I5042 Chronic combined systolic (congestive) and diastolic (congestive) heart failure: Secondary | ICD-10-CM | POA: Diagnosis present

## 2018-05-14 DIAGNOSIS — I248 Other forms of acute ischemic heart disease: Secondary | ICD-10-CM | POA: Diagnosis present

## 2018-05-14 DIAGNOSIS — N179 Acute kidney failure, unspecified: Secondary | ICD-10-CM | POA: Diagnosis present

## 2018-05-14 DIAGNOSIS — I484 Atypical atrial flutter: Secondary | ICD-10-CM | POA: Diagnosis present

## 2018-05-14 DIAGNOSIS — E7800 Pure hypercholesterolemia, unspecified: Secondary | ICD-10-CM | POA: Diagnosis present

## 2018-05-14 DIAGNOSIS — I2510 Atherosclerotic heart disease of native coronary artery without angina pectoris: Secondary | ICD-10-CM | POA: Diagnosis present

## 2018-05-14 LAB — BASIC METABOLIC PANEL
ANION GAP: 7 (ref 5–15)
BUN: 16 mg/dL (ref 6–20)
CHLORIDE: 110 mmol/L (ref 98–111)
CO2: 23 mmol/L (ref 22–32)
CREATININE: 0.95 mg/dL (ref 0.61–1.24)
Calcium: 8.5 mg/dL — ABNORMAL LOW (ref 8.9–10.3)
GFR calc non Af Amer: >60 mL/min (ref >60)
Glucose, Bld: 123 mg/dL — ABNORMAL HIGH (ref 70–99)
POTASSIUM: 3.8 mmol/L (ref 3.5–5.1)
SODIUM: 140 mmol/L (ref 135–145)

## 2018-05-14 LAB — CBC
HCT: 43.1 % (ref 39.0–52.0)
HEMATOCRIT: 44.4 % (ref 39.0–52.0)
HEMOGLOBIN: 14.1 g/dL (ref 13.0–17.0)
HEMOGLOBIN: 14.2 g/dL (ref 13.0–17.0)
MCH: 30.2 pg (ref 26.0–34.0)
MCH: 30.2 pg (ref 26.0–34.0)
MCHC: 32.7 g/dL (ref 30.0–36.0)
MCHC: 32 g/dL (ref 30.0–36.0)
MCV: 92.3 fL (ref 80.0–100.0)
MCV: 94.5 fL (ref 80.0–100.0)
NRBC: 0 % (ref 0.0–0.2)
Platelets: 151 10*3/uL (ref 150–400)
Platelets: 169 10*3/uL (ref 150–400)
RBC: 4.67 MIL/uL (ref 4.22–5.81)
RBC: 4.7 MIL/uL (ref 4.22–5.81)
RDW: 12.9 % (ref 11.5–15.5)
RDW: 12.9 % (ref 11.5–15.5)
WBC: 10.6 10*3/uL — ABNORMAL HIGH (ref 4.0–10.5)
WBC: 9.4 10*3/uL (ref 4.0–10.5)
nRBC: 0 % (ref 0.0–0.2)

## 2018-05-14 LAB — HIV ANTIBODY (ROUTINE TESTING W REFLEX): HIV Screen 4th Generation wRfx: NONREACTIVE

## 2018-05-14 LAB — RESPIRATORY PANEL BY PCR
Adenovirus: NOT DETECTED
BORDETELLA PERTUSSIS-RVPCR: NOT DETECTED
CORONAVIRUS OC43-RVPPCR: NOT DETECTED
Chlamydophila pneumoniae: NOT DETECTED
Coronavirus 229E: NOT DETECTED
Coronavirus HKU1: NOT DETECTED
Coronavirus NL63: NOT DETECTED
INFLUENZA A-RVPPCR: NOT DETECTED
INFLUENZA B-RVPPCR: NOT DETECTED
METAPNEUMOVIRUS-RVPPCR: NOT DETECTED
Mycoplasma pneumoniae: NOT DETECTED
PARAINFLUENZA VIRUS 4-RVPPCR: NOT DETECTED
Parainfluenza Virus 1: NOT DETECTED
Parainfluenza Virus 2: NOT DETECTED
Parainfluenza Virus 3: NOT DETECTED
RESPIRATORY SYNCYTIAL VIRUS-RVPPCR: NOT DETECTED
RHINOVIRUS / ENTEROVIRUS - RVPPCR: NOT DETECTED

## 2018-05-14 LAB — MRSA PCR SCREENING: MRSA by PCR: NEGATIVE

## 2018-05-14 LAB — TROPONIN I
Troponin I: 1.82 ng/mL (ref <0.03)
Troponin I: 1.57 ng/mL (ref <0.03)

## 2018-05-14 LAB — HEPARIN LEVEL (UNFRACTIONATED): Heparin Unfractionated: 0.53 IU/mL (ref 0.30–0.70)

## 2018-05-14 MED ORDER — METOPROLOL TARTRATE 5 MG/5ML IV SOLN
5.0000 mg | Freq: Once | INTRAVENOUS | Status: AC
  Administered 2018-05-14: 5 mg via INTRAVENOUS
  Filled 2018-05-14: qty 5

## 2018-05-14 MED ORDER — DILTIAZEM HCL ER COATED BEADS 240 MG PO CP24
240.0000 mg | ORAL_CAPSULE | Freq: Every day | ORAL | Status: DC
  Administered 2018-05-14 – 2018-05-15 (×2): 240 mg via ORAL
  Filled 2018-05-14 (×2): qty 1

## 2018-05-14 MED ORDER — ATORVASTATIN CALCIUM 20 MG PO TABS: 20.0000 mg | ORAL_TABLET | Freq: Every day | ORAL | Status: DC

## 2018-05-14 MED ORDER — LISINOPRIL 5 MG PO TABS
5.0000 mg | ORAL_TABLET | Freq: Every day | ORAL | Status: DC
  Administered 2018-05-14 – 2018-05-15 (×2): 5 mg via ORAL
  Filled 2018-05-14 (×2): qty 1

## 2018-05-14 MED ORDER — RIVAROXABAN 20 MG PO TABS
20.0000 mg | ORAL_TABLET | Freq: Every day | ORAL | Status: DC
  Administered 2018-05-14 – 2018-05-15 (×2): 20 mg via ORAL
  Filled 2018-05-14 (×2): qty 1

## 2018-05-14 MED ORDER — CARVEDILOL 3.125 MG PO TABS: 3.1250 mg | ORAL_TABLET | Freq: Two times a day (BID) | ORAL | Status: DC

## 2018-05-14 MED ORDER — CARVEDILOL 3.125 MG PO TABS
3.1250 mg | ORAL_TABLET | Freq: Two times a day (BID) | ORAL | Status: DC
  Filled 2018-05-14: qty 1

## 2018-05-14 NOTE — Progress Notes (Signed)
ANTICOAGULATION CONSULT NOTE   Pharmacy Consult for Xarelto Indication: atrial fibrillation  Allergies  Allergen Reactions  . Penicillins Rash    Has patient had a PCN reaction causing immediate rash, facial/tongue/throat swelling, SOB or lightheadedness with hypotension: Yes Has patient had a PCN reaction causing severe rash involving mucus membranes or skin necrosis: No Has patient had a PCN reaction that required hospitalization No Has patient had a PCN reaction occurring within the last 10 years: No If all of the above answers are "NO", then may proceed with Cephalosporin use.     Patient Measurements: Height: 6\' 2"  (188 cm) Weight: (!) 300 lb 12.8 oz (136.4 kg) IBW/kg (Calculated) : 82.2 Heparin Dosing Weight: 115 kg  Vital Signs: Temp: 97.5 F (36.4 C) (11/14 0811) Temp Source: Oral (11/14 0811) BP: 128/89 (11/14 1216) Pulse Rate: 98 (11/14 1216)  Labs: Recent Labs    05/13/18 1428 05/13/18 1434 05/13/18 1946 05/13/18 2134 05/14/18 0225 05/14/18 0806  HGB 16.2 16.3  --   --  14.1 14.2  HCT 49.3 48.0  --   --  43.1 44.4  PLT 270  --   --   --  151 169  HEPARINUNFRC  --   --   --  0.73*  --  0.53  CREATININE 1.56* 1.60*  --   --   --  0.95  TROPONINI  --   --  1.02*  --  1.82* 1.57*    Estimated Creatinine Clearance: 123 mL/min (by C-G formula based on SCr of 0.95 mg/dL).   Medical History: Past Medical History:  Diagnosis Date  . CHF (congestive heart failure) (HCC)   . Chronic combined systolic and diastolic CHF (congestive heart failure) (HCC)   . Cirrhosis (HCC)    liver scarring on biopsy, presumed to be due to methotrexate  . Depression   . DJD (degenerative joint disease)   . Essential hypertension 08/30/2013  . GERD (gastroesophageal reflux disease)    not present  . Gout   . Hives   . Hypercholesteremia   . Hypertension   . Mitral regurgitation - type I dysfunction - mild to moderate   . Obesity   . OSA (obstructive sleep apnea)    mild,  uses CPAP  . Paroxysmal atrial fibrillation (HCC)   . Persistent atrial fibrillation 03/02/2012  . Psoriasis   . S/P Minimally invasive maze operation for atrial fibrillation 03/20/2016   Complete bilateral atrial lesion set using cryothermy and bipolar radiofrequency ablation via right mini thoracotomy approach with oversewing of LA appendage  . Shortness of breath dyspnea    exertion  . Syncope 02/21/2014  . Thrombocytopenia (HCC)   . TIA (transient ischemic attack)      Assessment: 57 yo M presented to ED with chest pain and found to be in SVT.  HR has improved on Diltiazem gtt.  Pharmacy initially consulted for heparin dosing. Now to switch to Xarelto.    Of note pt was on Xarelto in the past but stopped it due to finances.  Placed CM consult for medication assistance.  Goal of Therapy:  Therapeutic Anticoagulation Monitor platelets by anticoagulation protocol: Yes   Plan:  Discontinue heparin and associated labs. Xarelto 20mg  PO daily with food. Can discontinue heparin when first dose of Xarelto given. Monitor for s/sx of bleed  Thanks for allowing pharmacy to be a part of this patient's care.  [employer], Pharm.D., BCPS Clinical Pharmacist Pager: [phone] Clinical phone for 05/14/2018 from 8:30-4:00 is [phone].  **Pharmacist  phone directory can now be found on [email or web address] ([email or web address]).  Listed under [facility].  05/14/2018 12:23 PM

## 2018-05-14 NOTE — Consult Note (Addendum)
Cardiology Consultation:   Patient ID: WAINO LESSA MRN: [id number]; DOB: 1960/10/08  Admit date: 05/13/2018 Date of Consult: 05/14/2018  Primary Care Provider: No primary care provider on file. Primary Cardiologist: Hillis Range, MD  Primary Electrophysiologist:  Hillis Range, MD    Patient Profile:   Joseph Garrett is a 57 y.o. male with a hx of chronic CHF (combined) with recovered LVEF in 2017, HTN, HLD, obesity, OSA w/CPAP, psoriasis, TIA, liver cirrhosis (history list reports liver scarring, biopsy done , presumed 2/2 methotrexate, and AFib    who is being seen today for the evaluation of Aflutter at the request of Dr. Anne Fu.  History of Present Illness:   Joseph Garrett has hx of persistent AFib historically w/ prior AFib ablations by Dr. [name] 2013, 2014, and subsequently minimally invasive MAZE/Endocardial oversewing of left atrial appendage in 2017 by [name].  The patient last saw Dr. Giavanni Zeitlin jan 2018, was doing well, his amiodarone stopped to continue life long a/c given TIA hx, saw AF clinic in April 20-18 off amio, without symptoms of AF.  He was admitted to [facility] yesterday with recent subjective symptoms for URI started on abx and steroid out patient without improvement, yesterday developed CP, palpitations andcame in.  He was found in an SVT w/HR 200bpm.  IV dilt slowed the rate and felt to have at some point converted to a ST.  Cardiology was consulted, noted him in AFlutter, initial EKGs with significant diffuse ST changes, and Trop elevation.  With rate control ST changes resolve, and findings felt to be demand ischemia 2/2 to SVT.  He was admitted to IM service, w/URI (panel pending), AKI, SVT. He has been afebrile here  LABS  K+ 3.8 BUN/Creat 23/1.56 >> 16/0.95  Mag 2.4 WBC 17 >> 9.4 H/H  Trop I <0.03 ,  1.02 , 1.82 , 1.57  The patien reports feeling "off" last weekend, his wife noted his pulse slow 40-40's, he saw his PMD Monday with reports of subjective  symptoms of fever, thought to have an URI, pt reports a fullness in his head as well, started on Abx and steroid, 2 days ago felt like he went out of rhythm, yesterday developed racing and CP, weakness.  Past Medical History:  Diagnosis Date  . CHF (congestive heart failure) (HCC)   . Chronic combined systolic and diastolic CHF (congestive heart failure) (HCC)   . Cirrhosis (HCC)    liver scarring on biopsy, presumed to be due to methotrexate  . Depression   . DJD (degenerative joint disease)   . Essential hypertension 08/30/2013  . GERD (gastroesophageal reflux disease)    not present  . Gout   . Hives   . Hypercholesteremia   . Hypertension   . Mitral regurgitation - type I dysfunction - mild to moderate   . Obesity   . OSA (obstructive sleep apnea)    mild, uses CPAP  . Paroxysmal atrial fibrillation (HCC)   . Persistent atrial fibrillation 03/02/2012  . Psoriasis   . S/P Minimally invasive maze operation for atrial fibrillation 03/20/2016   Complete bilateral atrial lesion set using cryothermy and bipolar radiofrequency ablation via right mini thoracotomy approach with oversewing of LA appendage  . Shortness of breath dyspnea    exertion  . Syncope 02/21/2014  . Thrombocytopenia (HCC)   . TIA (transient ischemic attack)     Past Surgical History:  Procedure Laterality Date  . ABLATION OF DYSRHYTHMIC FOCUS  09/17/2012  . ATRIAL FIBRILLATION ABLATION  03/17/12   PVI and CTI ablation by Dr [name]  . ATRIAL FIBRILLATION ABLATION N/A 03/17/2012   Procedure: ATRIAL FIBRILLATION ABLATION;  Surgeon: Hillis Range, MD;  Location: [facility] CATH LAB;  Service: Cardiovascular;  Laterality: N/A;  . ATRIAL FIBRILLATION ABLATION N/A 09/18/2012   Procedure: ATRIAL FIBRILLATION ABLATION;  Surgeon: Hillis Range, MD;  Location: [facility] CATH LAB;  Service: Cardiovascular;  Laterality: N/A;  . CARDIAC CATHETERIZATION N/A 01/25/2016   Procedure: Right/Left Heart Cath and Coronary Angiography;  Surgeon: Laurey Morale, MD;  Location: [facility] INVASIVE CV LAB;  Service: Cardiovascular;  Laterality: N/A;  . CARDIOVERSION N/A 10/09/2012   Procedure: CARDIOVERSION;  Surgeon: Laurey Morale, MD;  Location: [facility] ENDOSCOPY;  Service: Cardiovascular;  Laterality: N/A;  . CARDIOVERSION N/A 02/22/2014   Procedure: CARDIOVERSION;  Surgeon: Quintella Reichert, MD;  Location: [facility] ENDOSCOPY;  Service: Cardiovascular;  Laterality: N/A;  . CARDIOVERSION N/A 12/29/2015   Procedure: CARDIOVERSION;  Surgeon: Hillis Range, MD;  Location: [facility] OR;  Service: Cardiovascular;  Laterality: N/A;  . CHOLECYSTECTOMY  2011  . ELBOW ARTHROSCOPY Left 08/2014  . ELECTROPHYSIOLOGIC STUDY N/A 12/29/2015   Procedure: Cardioversion;  Surgeon: Hillis Range, MD;  Location: [facility] INVASIVE CV LAB;  Service: Cardiovascular;  Laterality: N/A;  . EYE SURGERY Bilateral 04/2015   cataract surgery  . KNEE ARTHROSCOPY Right   . MINIMALLY INVASIVE MAZE PROCEDURE N/A 03/20/2016   Procedure: MINIMALLY INVASIVE MAZE PROCEDURE;  Surgeon: Purcell Nails, MD;  Location: [facility] OR;  Service: Open Heart Surgery;  Laterality: N/A;  . NOSE SURGERY     Turbinates  . RECONSTRUCTION OF NOSE    . TEE WITHOUT CARDIOVERSION  03/16/2012   Procedure: TRANSESOPHAGEAL ECHOCARDIOGRAM (TEE);  Surgeon: [name], MD;  Location: [facility] ENDOSCOPY;  Service: Cardiovascular;  Laterality: N/A;  . TEE WITHOUT CARDIOVERSION N/A 09/17/2012   Procedure: TRANSESOPHAGEAL ECHOCARDIOGRAM (TEE);  Surgeon: Lewayne Bunting, MD;  Location: [facility] ENDOSCOPY;  Service: Cardiovascular;  Laterality: N/A;  . TEE WITHOUT CARDIOVERSION N/A 01/25/2016   Procedure: TRANSESOPHAGEAL ECHOCARDIOGRAM (TEE);  Surgeon: Laurey Morale, MD;  Location: [facility] ENDOSCOPY;  Service: Cardiovascular;  Laterality: N/A;  . TEE WITHOUT CARDIOVERSION N/A 03/20/2016   Procedure: TRANSESOPHAGEAL ECHOCARDIOGRAM (TEE);  Surgeon: Purcell Nails, MD;  Location: [facility] OR;  Service: Open Heart Surgery;  Laterality: N/A;  . TENDON REPAIR     Right Forearm   . TENDON REPAIR Right 1990's   forearm     Home Medications:  Prior to Admission medications   Medication Sig Start Date End Date Taking? Authorizing Provider  diclofenac sodium (VOLTAREN) 1 % GEL Apply 4 g topically 4 (four) times daily. 02/05/18  Yes Coralee Rud, PA-C  ergocalciferol (VITAMIN D2) 1.25 MG (50000 UT) capsule Take 50,000 Units by mouth once a week. 01/18/18  Yes Coralee Rud, PA-C  halobetasol (ULTRAVATE) 0.05 % cream Apply 0.5 g topically 2 (two) times daily. 02/05/18  Yes Coralee Rud, PA-C  ibuprofen (ADVIL,MOTRIN) 200 MG tablet Take 400-800 mg by mouth every 6 (six) hours as needed for headache (or pain).   Yes [provider]  lisinopril (PRINIVIL,ZESTRIL) 5 MG tablet Take 5 mg by mouth daily. 05/11/18  Yes Coralee Rud, PA-C  meloxicam (MOBIC) 15 MG tablet Take 15 mg by mouth daily. 01/14/18  Yes Coralee Rud, PA-C  milk thistle 175 MG tablet Take 175 mg by mouth daily.   Yes [provider]  oxyCODONE (ROXICODONE) 15 MG immediate release tablet Take 15 mg  by mouth daily as needed for pain. Take 1 tablet daily as needed 01/29/18  Yes Coralee Rud, PA-C  simvastatin (ZOCOR) 40 MG tablet Take 40 mg by mouth daily. 01/04/18  Yes Coralee Rud, PA-C  zolpidem (AMBIEN) 10 MG tablet Take 10 mg by mouth at bedtime as needed for sleep.   Yes [provider]  carvedilol (COREG) 3.125 MG tablet Take 1 tablet (3.125 mg) as need when BP remain above 140/90.    [provider]  oxymetazoline (AFRIN) 0.05 % nasal spray Place 2 sprays into both nostrils 2 (two) times daily as needed for congestion.    [provider]  rivaroxaban (XARELTO) 20 MG TABS tablet Take 1 tablet (20 mg total) by mouth daily with supper. Patient not taking: Reported on 04/07/2017 03/26/16   Gershon Crane E, PA-C  sertraline (ZOLOFT) 100 MG tablet Take 150 mg by mouth every morning.     [provider]    Inpatient Medications: Scheduled  Meds: . clobetasol cream   Topical BID  . simvastatin  40 mg Oral QHS   Continuous Infusions: . diltiazem (CARDIZEM) infusion 15 mg/hr (05/14/18 0832)  . heparin 1,550 Units/hr (05/14/18 0449)   PRN Meds: acetaminophen **OR** acetaminophen, ondansetron **OR** ondansetron (ZOFRAN) IV, oxyCODONE, zolpidem  Allergies:    Allergies  Allergen Reactions  . Penicillins Rash    Has patient had a PCN reaction causing immediate rash, facial/tongue/throat swelling, SOB or lightheadedness with hypotension: Yes Has patient had a PCN reaction causing severe rash involving mucus membranes or skin necrosis: No Has patient had a PCN reaction that required hospitalization No Has patient had a PCN reaction occurring within the last 10 years: No If all of the above answers are "NO", then may proceed with Cephalosporin use.     Social History:   Social History   Socioeconomic History  . Marital status: Married    Spouse name: Not on file  . Number of children: Not on file  . Years of education: Not on file  . Highest education level: Not on file  Occupational History  . Not on file  Social Needs  . Financial resource strain: Not on file  . Food insecurity:    Worry: Not on file    Inability: Not on file  . Transportation needs:    Medical: Not on file    Non-medical: Not on file  Tobacco Use  . Smoking status: Never Smoker  . Smokeless tobacco: Never Used  Substance and Sexual Activity  . Alcohol use: No  . Drug use: No  . Sexual activity: Not on file  Lifestyle  . Physical activity:    Days per week: Not on file    Minutes per session: Not on file  . Stress: Not on file  Relationships  . Social connections:    Talks on phone: Not on file    Gets together: Not on file    Attends religious service: Not on file    Active member of club or organization: Not on file    Attends meetings of clubs or organizations: Not on file    Relationship status: Not on file  . Intimate partner  violence:    Fear of current or ex partner: Not on file    Emotionally abused: Not on file    Physically abused: Not on file    Forced sexual activity: Not on file  Other Topics Concern  . Not on file  Social History Narrative  Lives in [place] with spouse.  3 children are healthy.     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    Family History:   Family History  Problem Relation Age of Onset  . Heart attack Father   . Heart disease Unknown   . Diabetes Unknown      ROS:  Please see the history of present illness.  All other ROS reviewed and negative.     Physical Exam/Data:   Vitals:   05/14/18 0043 05/14/18 0423 05/14/18 0700 05/14/18 0811  BP: (!) 139/110 (!) 130/98 134/80 (!) 132/100  Pulse: (!) 115 93  70  Resp:    15  Temp: 97.6 F (36.4 C) (!) 97.5 F (36.4 C)  (!) 97.5 F (36.4 C)  TempSrc: Oral Oral  Oral  SpO2: 96% 98% 99% 99%  Weight:      Height:        Intake/Output Summary (Last 24 hours) at 05/14/2018 1049 Last data filed at 05/14/2018 0842 Gross per 24 hour  Intake 1234.96 ml  Output 1875 ml  Net -640.04 ml   Filed Weights   05/13/18 1500 05/13/18 2112  Weight: (!) 139.7 kg (!) 136.4 kg   Body mass index is 38.62 kg/m.  General:  Well nourished, well developed, in no acute distress HEENT: normal Lymph: no adenopathy Neck: no JVD Endocrine:  No thryomegaly Vascular: No carotid bruits  Cardiac:  irreg-irreg; no murmurs, gallops or rubs Lungs:  CTA b/l, no wheezing, rhonchi or rales  Abd: soft, nontender  Ext: no edema Musculoskeletal:  No deformities Skin: warm and dry  Neuro:  no focal abnormalities noted Psych:  Normal affect, very animated   EKG:  The EKG was personally reviewed and demonstrates:   #1 SVT 200bpmdiffuse ST depressions, elevation in aVR #2 AFlutter 2:1, 112bpm, ST changes resolved Telemetry:  Telemetry was personally reviewed and demonstrates:   AFlutter 80's-low 100s  Relevant CV  Studies:  Right/Left Heart Cath and Coronary Angiography 01/25/16  Conclusion  1. Normal filling pressures.  2. Nonobstructive coronary disease.  3. EF 50-55%.    2D echo 06/04/16 Study Conclusions - Left ventricle: LV longitudinal lV strain is abnormal at -12%. The cavity size was normal. Systolic function was normal. The estimated ejection fraction was in the range of 50% to 55%. Diffuse hypokinesis. - Aortic valve: Trileaflet; mildly thickened leaflets. - Right ventricle: Systolic function was moderately reduced. - Pulmonary arteries: PA peak pressure: 51 mm Hg (S). - Pericardium, extracardiac: A small, free-flowing pericardial effusion was identified along the right ventricular free wall. The fluid had no internal echoes.There was no evidence of hemodynamic compromise. Impressions: - The right ventricular systolic pressure was increased consistent with moderate pulmonary hypertension.    Laboratory Data:  Chemistry Recent Labs  Lab 05/13/18 1428 05/13/18 1434 05/14/18 0806  NA 137 141 140  K 3.8 4.0 3.8  CL 105 107 110  CO2 20*  --  23  GLUCOSE 152* 151* 123*  BUN 23* 25* 16  CREATININE 1.56* 1.60* 0.95  CALCIUM 9.2  --  8.5*  GFRNONAA 47*  --  >60  GFRAA 54*  --  >60  ANIONGAP 12  --  7    No results for input(s): PROT, ALBUMIN, AST, ALT, ALKPHOS, BILITOT in the last 168 hours. Hematology Recent Labs  Lab 05/13/18 1428 05/13/18 1434 05/14/18 0225 05/14/18 0806  WBC 17.4*  --  10.6* 9.4  RBC 5.26  --  4.67 4.70  HGB  16.2 16.3 14.1 14.2  HCT 49.3 48.0 43.1 44.4  MCV 93.7  --  92.3 94.5  MCH 30.8  --  30.2 30.2  MCHC 32.9  --  32.7 32.0  RDW 12.7  --  12.9 12.9  PLT 270  --  151 169   Cardiac Enzymes Recent Labs  Lab 05/13/18 1946 05/14/18 0225 05/14/18 0806  TROPONINI 1.02* 1.82* 1.57*    Recent Labs  Lab 05/13/18 1432  TROPIPOC 0.25*    BNPNo results for input(s): BNP, PROBNP in the last 168 hours.  DDimer No results for  input(s): DDIMER in the last 168 hours.  Radiology/Studies:   Dg Chest 2 View Result Date: 05/14/2018 CLINICAL DATA:  Upper respiratory infection. EXAM: CHEST - 2 VIEW COMPARISON:  05/13/2018 FINDINGS: The cardiac silhouette is borderline enlarged. There is persistent eventration of the right hemidiaphragm. No airspace consolidation, edema, pleural effusion, or pneumothorax is identified. No acute osseous abnormality is seen. IMPRESSION: No active cardiopulmonary disease. Electronically Signed   By: Sebastian Ache M.D.   On: 05/14/2018 08:57      Assessment and Plan:   1. SVT, >> AFlutter     Atypical     Rate is better  Wife mentions last weekend the patient started to feel poorly and everytime she checked his HR was in the 40's  Long hx of AFib as noted above 2017 MAZE/LAA sewing CHA2DS2Vasc is 4, THE PATIENT SELF STOPPED HIS XARELTO 2 YEARS AGO >> heparin gtt here Counseled on the importance of this, they tell me the xarelto was very expensive, he had not been having AF and he made an exacutive decision to stop it, though willing to resume  TEE/DCCV tomorrow (he has eaten) maybe the best starting place, Not sure if we need an AAD yet  ?provoked event The patient wants to discuss a repeat ablation procedure   2. Diffuse ST changes w/SVT resolved with rate control     Abn Trop, peaked at 1.82, this AM 1.57  The patient has been CP free since the ER [facility] 01/24/18  Diagnostic  Dominance: Right  Left Main  30% distal LM stenosis.  Left Anterior Descending  Luminal irregularities.  Ramus Intermedius  Large, no significant disease.  Left Circumflex  No significant disease.  Right Coronary Artery  No significant disease.   This is reassuring, that Trop 2/2 extreme tachycardia.  Last trop has trended downward. Discussed with Dr. Anne Fu, did not feel strongly either way regarding LHC or not, especially if remaining symptom free, deferred to [place]  I will discuss with Dr. [name],  +/- repeat LHC  3. URI     He has been afebrile, though the patient is very clear that he runs a temp of 96, and anything 97 or higher is a fever for him     He feels like he has fullness in his head like an infection as well.     I will defer to medicine team  4. HTN     Renal function is better/normal, resume his home lisinopril  For questions or updates, please contact [facility] Please consult [email or web address] for contact info under   Signed, Sheilah Pigeon, PA-C  05/14/2018 10:49 AM;  I have seen, examined the patient, and reviewed the above assessment and plan.  Changes to above are made where necessary.  On exam, iRRR.  Presents now with atypical atrial flutter in the setting of URI, recent antibiotics, and steroids.  I would advise restarting xarelto  and converting diltiazem to oral.  Plan TEE guided cardioversion when schedule allows. Will make NPO after midnight.  Co Sign: Hillis Range, MD 05/14/2018 4:30 PM

## 2018-05-14 NOTE — Progress Notes (Signed)
Pt wearing CPAP for the night and tolerating well. 

## 2018-05-14 NOTE — Care Management (Signed)
Per Governor Specking W/[employer]. Co-pay amount for a 30 day supply of Xarelto 15 mg. And 20mg . Zero co-pay. No PA required. Pharmacies : [facility], [facility], [facility], [facility].  REF.# [id number].

## 2018-05-14 NOTE — Plan of Care (Signed)
Patient was oriented to the unit. He was instructed about being a moderate fall risk and that he needs to call using the call bell if he wants to get out of the bed. He was educated about the use of the heparin drip and the cardizem drip. He acknowledges understanding. Will continue to monitor and assess patient.

## 2018-05-14 NOTE — Care Management Note (Signed)
Case Management Note  Patient Details  Name: BETTY DADA MRN: [id number] Date of Birth: 08-22-1960  Subjective/Objective:   From home with wife, pta indep, SVT's in 200S, EP following, will be on xarelto, NCM gave patient a 30 day savings coupon and the 10.00 co pay coupon.  Awaiting benefit check also for xarelto. PCP is Arnette Felts with [facility] per patient.                  Action/Plan: DC home when ready.   Expected Discharge Date:                  Expected Discharge Plan:  Home/Self Care  In-House Referral:     Discharge planning Services  CM Consult, Medication Assistance  Post Acute Care Choice:    Choice offered to:     DME Arranged:    DME Agency:     HH Arranged:    HH Agency:     Status of Service:  In process, will continue to follow  If discussed at Long Length of Stay Meetings, dates discussed:    Additional Comments:  Leone Haven, RN 05/14/2018, 3:06 PM

## 2018-05-14 NOTE — Progress Notes (Signed)
PROGRESS NOTE    SUSAN ARANA  ZOX:[id number] DOB: Apr 21, 1961 DOA: 05/13/2018 PCP: No primary care provider on file.  Brief Narrative: Mr. Goldring has hx of persistent AFib historically w/ prior AFib ablations by Dr. 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.  The patient last saw Dr. Allred jan 2018, was doing well, his amiodarone stopped to continue life long a/c given TIA hx, saw AF clinic in April 20-18 off amio, without symptoms of AF. Presented to the emergency room 11/13 with palpitations, chest discomfort found to be in SVT with heart rate in the 200s, was started on IV diltiazem and heparin   Assessment & Plan:   Principal Problem:   SVT (supraventricular tachycardia) with some atrial flutter/A. Fib -Rate improved, now on Cardizem drip -Long history of A. fib, history of maze procedure in 2017, -No longer on anticoagulation, now on heparin, will likely need to restart anticoagulation, await cards input -EP to see today  Elevated troponin -Likely secondary to demand from SVT and tachycardia -We will follow trend -Left heart cath in 7/201 9 with nonobstructive CAD -?start beta-blocker, reportedly takes Coreg as needed  URI/recent in the last week or so -Supportive care, afebrile, no leukocytosis at this time, respiratory virus panel is negative  Obstructive sleep apnea -Continue CPAP  Acute kidney injury -Sleep due to tachycardia/hypotension on admission, now resolved -Lisinopril restarted, stop IV fluids  DVT prophylaxis: Heparin Code Status: Full code Family Communication: No family at bedside Disposition Plan: Home per cardiology recommendations Consultants:   EP   Procedures:   Antimicrobials:    Subjective: -Is better this morning, breathing back to baseline -Denies any chest pain  Objective: Vitals:   05/14/18 0423 05/14/18 0700 05/14/18 0811 05/14/18 1216  BP:  (!) 130/98 134/80 (!) 132/100 128/89  Pulse: 93  70 98  Resp:   15 16  Temp: (!) 97.5 F (36.4 C)  (!) 97.5 F (36.4 C)   TempSrc: Oral  Oral   SpO2: 98% 99% 99% 98%  Weight:      Height:        Intake/Output Summary (Last 24 hours) at 05/14/2018 1312 Last data filed at 05/14/2018 1218 Gross per 24 hour  Intake 1234.96 ml  Output 2200 ml  Net -965.04 ml   Filed Weights   05/13/18 1500 05/13/18 2112  Weight: (!) 139.7 kg (!) 136.4 kg    Examination:  General exam: Appears calm and comfortable, no distress, AAO x3 Respiratory system: Clear bilaterally Cardiovascular system: S1 & S2 heard, irregularly irregular rhythm  Gastrointestinal system: Abdomen is nondistended, soft and nontender.Normal bowel sounds heard. Central nervous system: Alert and oriented. No focal neurological deficits. Extremities: Symmetric 5 x 5 power. Skin: No rashes, lesions or ulcers Psychiatry: Judgement and insight appear normal. Mood & affect appropriate.     Data Reviewed:   CBC: Recent Labs  Lab 05/13/18 1428 05/13/18 1434 05/14/18 0225 05/14/18 0806  WBC 17.4*  --  10.6* 9.4  HGB 16.2 16.3 14.1 14.2  HCT 49.3 48.0 43.1 44.4  MCV 93.7  --  92.3 94.5  PLT 270  --  151 169   Basic Metabolic Panel: Recent Labs  Lab 05/13/18 1428 05/13/18 1434 05/14/18 0806  NA 137 141 140  K 3.8 4.0 3.8  CL 105 107 110  CO2 20*  --  23  GLUCOSE 152* 151* 123*  BUN 23* 25* 16  CREATININE 1.56* 1.60* 0.95  CALCIUM 9.2  --  8.5*  MG 2.4  --   --    GFR: Estimated Creatinine Clearance: 123 mL/min (by C-G formula based on SCr of 0.95 mg/dL). Liver Function Tests: No results for input(s): AST, ALT, ALKPHOS, BILITOT, PROT, ALBUMIN in the last 168 hours. No results for input(s): LIPASE, AMYLASE in the last 168 hours. No results for input(s): AMMONIA in the last 168 hours. Coagulation Profile: No results for input(s): INR, PROTIME in the last 168 hours. Cardiac Enzymes: Recent Labs  Lab  05/13/18 1946 05/14/18 0225 05/14/18 0806  TROPONINI 1.02* 1.82* 1.57*   BNP (last 3 results) No results for input(s): PROBNP in the last 8760 hours. HbA1C: No results for input(s): HGBA1C in the last 72 hours. CBG: No results for input(s): GLUCAP in the last 168 hours. Lipid Profile: No results for input(s): CHOL, HDL, LDLCALC, TRIG, CHOLHDL, LDLDIRECT in the last 72 hours. Thyroid Function Tests: Recent Labs    05/13/18 1946  TSH 0.770   Anemia Panel: No results for input(s): VITAMINB12, FOLATE, FERRITIN, TIBC, IRON, RETICCTPCT in the last 72 hours. Urine analysis:    Component Value Date/Time   COLORURINE AMBER (A) 04/22/2016 0018   APPEARANCEUR CLOUDY (A) 04/22/2016 0018   LABSPEC 1.017 04/22/2016 0018   PHURINE 6.5 04/22/2016 0018   GLUCOSEU NEGATIVE 04/22/2016 0018   HGBUR NEGATIVE 04/22/2016 0018   BILIRUBINUR SMALL (A) 04/22/2016 0018   KETONESUR NEGATIVE 04/22/2016 0018   PROTEINUR NEGATIVE 04/22/2016 0018   NITRITE NEGATIVE 04/22/2016 0018   LEUKOCYTESUR NEGATIVE 04/22/2016 0018   Sepsis Labs: @LABRCNTIP (procalcitonin:4,lacticidven:4)  ) Recent Results (from the past 240 hour(s))  Respiratory Panel by PCR     Status: None   Collection Time: 05/13/18  9:52 PM  Result Value Ref Range Status   Adenovirus NOT DETECTED NOT DETECTED Final   Coronavirus 229E NOT DETECTED NOT DETECTED Final   Coronavirus HKU1 NOT DETECTED NOT DETECTED Final   Coronavirus NL63 NOT DETECTED NOT DETECTED Final   Coronavirus OC43 NOT DETECTED NOT DETECTED Final   Metapneumovirus NOT DETECTED NOT DETECTED Final   Rhinovirus / Enterovirus NOT DETECTED NOT DETECTED Final   Influenza A NOT DETECTED NOT DETECTED Final   Influenza B NOT DETECTED NOT DETECTED Final   Parainfluenza Virus 1 NOT DETECTED NOT DETECTED Final   Parainfluenza Virus 2 NOT DETECTED NOT DETECTED Final   Parainfluenza Virus 3 NOT DETECTED NOT DETECTED Final   Parainfluenza Virus 4 NOT DETECTED NOT DETECTED Final     Respiratory Syncytial Virus NOT DETECTED NOT DETECTED Final   Bordetella pertussis NOT DETECTED NOT DETECTED Final   Chlamydophila pneumoniae NOT DETECTED NOT DETECTED Final   Mycoplasma pneumoniae NOT DETECTED NOT DETECTED Final    Comment: Performed at [facility], [address] [place], [place] [address]  MRSA PCR Screening     Status: None   Collection Time: 05/13/18  9:53 PM  Result Value Ref Range Status   MRSA by PCR NEGATIVE NEGATIVE Final    Comment:        The GeneXpert MRSA Assay (FDA approved for NASAL specimens only), is one component of a comprehensive MRSA colonization surveillance program. It is not intended to diagnose MRSA infection nor to guide or monitor treatment for MRSA infections. Performed at [facility], [address] [place], [place] [address]          Radiology Studies: Dg Chest 2 View  Result Date: 05/14/2018 CLINICAL DATA:  Upper respiratory infection. EXAM: CHEST - 2 VIEW COMPARISON:  05/13/2018 FINDINGS: The cardiac silhouette is borderline enlarged. There is persistent eventration of the right hemidiaphragm. No airspace consolidation, edema, pleural effusion, or pneumothorax is identified. No acute osseous abnormality is seen. IMPRESSION: No active cardiopulmonary disease. Electronically Signed   By: Sebastian Ache M.D.   On: 05/14/2018 08:57   Dg Chest Portable 1 View  Result Date: 05/13/2018 CLINICAL DATA:  Chest pain EXAM: PORTABLE CHEST 1 VIEW COMPARISON:  04/22/2016 CXR, chest CT 04/23/2016 FINDINGS: Chronic eventration of the right hemidiaphragm. Stable cardiomegaly with minimal aortic atherosclerosis. No pulmonary consolidation or CHF. No effusion or pneumothorax. No acute osseous appearing abnormality. IMPRESSION: No active disease. Electronically Signed   By: Tollie Eth M.D.   On: 05/13/2018 14:40        Scheduled Meds: . clobetasol cream   Topical BID  . diltiazem  240 mg Oral Daily  . lisinopril  5 mg Oral  Daily  . rivaroxaban  20 mg Oral Q supper  . simvastatin  40 mg Oral QHS   Continuous Infusions:   LOS: 0 days    Time spent:    Zannie Cove, MD [facility] Page via [email or web address], password [email or web address] After 7PM please contact night-coverage  05/14/2018, 1:12 PM

## 2018-05-15 ENCOUNTER — Inpatient Hospital Stay (HOSPITAL_COMMUNITY): Payer: BLUE CROSS/BLUE SHIELD

## 2018-05-15 ENCOUNTER — Inpatient Hospital Stay (HOSPITAL_COMMUNITY): Payer: BLUE CROSS/BLUE SHIELD | Admitting: Certified Registered"

## 2018-05-15 ENCOUNTER — Encounter (HOSPITAL_COMMUNITY): Payer: Self-pay

## 2018-05-15 ENCOUNTER — Encounter (HOSPITAL_COMMUNITY)
Admission: EM | Disposition: A | Discharge status: Home / Self Care | Payer: Self-pay | Source: Home / Self Care | Attending: Internal Medicine

## 2018-05-15 DIAGNOSIS — I4891 Unspecified atrial fibrillation: Secondary | ICD-10-CM

## 2018-05-15 DIAGNOSIS — I340 Nonrheumatic mitral (valve) insufficiency: Secondary | ICD-10-CM

## 2018-05-15 HISTORY — PX: CARDIOVERSION: SHX1299

## 2018-05-15 HISTORY — PX: TEE WITHOUT CARDIOVERSION: SHX5443

## 2018-05-15 LAB — CBC
HCT: 44.7 % (ref 39.0–52.0)
Hemoglobin: 14.5 g/dL (ref 13.0–17.0)
MCH: 30.1 pg (ref 26.0–34.0)
MCHC: 32.4 g/dL (ref 30.0–36.0)
MCV: 92.7 fL (ref 80.0–100.0)
NRBC: 0 % (ref 0.0–0.2)
PLATELETS: 164 10*3/uL (ref 150–400)
RBC: 4.82 MIL/uL (ref 4.22–5.81)
RDW: 12.8 % (ref 11.5–15.5)
WBC: 6.6 10*3/uL (ref 4.0–10.5)

## 2018-05-15 LAB — BASIC METABOLIC PANEL
ANION GAP: 7 (ref 5–15)
BUN: 13 mg/dL (ref 6–20)
CO2: 25 mmol/L (ref 22–32)
Calcium: 8.7 mg/dL — ABNORMAL LOW (ref 8.9–10.3)
Chloride: 110 mmol/L (ref 98–111)
Creatinine, Ser: 1.05 mg/dL (ref 0.61–1.24)
GLUCOSE: 95 mg/dL (ref 70–99)
POTASSIUM: 3.8 mmol/L (ref 3.5–5.1)
SODIUM: 142 mmol/L (ref 135–145)

## 2018-05-15 SURGERY — ECHOCARDIOGRAM, TRANSESOPHAGEAL
Anesthesia: General

## 2018-05-15 MED ORDER — LIDOCAINE 2% (20 MG/ML) 5 ML SYRINGE
INTRAMUSCULAR | Status: DC | PRN
  Administered 2018-05-15: 100 mg via INTRAVENOUS

## 2018-05-15 MED ORDER — PROPOFOL 500 MG/50ML IV EMUL
INTRAVENOUS | Status: DC | PRN
  Administered 2018-05-15: 150 ug/kg/min via INTRAVENOUS

## 2018-05-15 MED ORDER — SODIUM CHLORIDE 0.9 % IV SOLN
250.0000 mL | INTRAVENOUS | Status: DC
  Administered 2018-05-15: 10:00:00 via INTRAVENOUS

## 2018-05-15 MED ORDER — SODIUM CHLORIDE 0.9% FLUSH: 3.0000 mL | Freq: Two times a day (BID) | INTRAVENOUS | Status: DC

## 2018-05-15 MED ORDER — SODIUM CHLORIDE 0.9% FLUSH: 3.0000 mL | INTRAVENOUS | Status: DC | PRN

## 2018-05-15 MED ORDER — SODIUM CHLORIDE 0.9 % IV SOLN: INTRAVENOUS | Status: DC

## 2018-05-15 MED ORDER — PROPOFOL 10 MG/ML IV BOLUS
INTRAVENOUS | Status: DC | PRN
  Administered 2018-05-15 (×2): 25 mg via INTRAVENOUS
  Administered 2018-05-15: 50 mg via INTRAVENOUS

## 2018-05-15 MED ORDER — RIVAROXABAN 20 MG PO TABS: 20.0000 mg | ORAL_TABLET | Freq: Every day | ORAL | 1 refills | Status: AC

## 2018-05-15 MED ORDER — DILTIAZEM HCL ER COATED BEADS 240 MG PO CP24: 240.0000 mg | ORAL_CAPSULE | Freq: Every day | ORAL | 0 refills | Status: DC

## 2018-05-15 MED ORDER — HYDROCORTISONE 1 % EX CREA
1.0000 "application " | TOPICAL_CREAM | Freq: Three times a day (TID) | CUTANEOUS | Status: DC | PRN
  Filled 2018-05-15: qty 28

## 2018-05-15 NOTE — Op Note (Signed)
TEE  Patient sedated with Propofol by anesthesia  Probe advanced to mid esophagus without difficulty  LA, without masses  No LA appendage (ligated previously_  RA without masses No PFO by color doppler only  TV normal  Mild TR AV mildly thickened   No AI MV normal  Mild MR PV normal  Trace PI  LVEF is mildly depressed  RVEF is depressed  Very mnimal fixed plaquing of aorta.

## 2018-05-15 NOTE — Op Note (Signed)
  Cardioversion  Patient sedated by Catha Brow with Propofol IV  Pads placed in [employer] position  200 J synchronized biphasic energy delivered with conversion to SR  Procedure was without complication.  [name]

## 2018-05-15 NOTE — Progress Notes (Addendum)
  Echocardiogram Echocardiogram Transesophageal has been performed.  Joseph Garrett L Androw 05/15/2018, 11:02 AM

## 2018-05-15 NOTE — Progress Notes (Signed)
S/p DCCV, patient is in SR 70's, BP 128/76 Patient feels well  OK to discharge form EP perspective  Continue the diltiazem CD 240mg  daily Continue xarelto 20mg  daily, I have discussed at length with the patient the importance of this.  (I have asked the RN to give with lunch today when it comes prior to discharge to ensure he gets today's dose,  and instructed to patient starting tomorrow to take every day with dinner/full meal) He has a home rx for coreg 3.125mg  BID PRN for elevated BP.  He should not require this with the addition of diltiazem, though no objection to it as long as HR is >60 prior to using  Early follow up is arranged with the AFib  Francis Dowse, PA-C

## 2018-05-15 NOTE — Transfer of Care (Signed)
Immediate Anesthesia Transfer of Care Note  Patient: Joseph Garrett  Procedure(s) Performed: TRANSESOPHAGEAL ECHOCARDIOGRAM (TEE) (N/A ) CARDIOVERSION (N/A )  Patient Location: Endoscopy Unit  Anesthesia Type:MAC  Level of Consciousness: drowsy  Airway & Oxygen Therapy: Patient Spontanous Breathing and Patient connected to nasal cannula oxygen  Post-op Assessment: Report given to RN and Post -op Vital signs reviewed and stable  Post vital signs: Reviewed and stable  Last Vitals:  Vitals Value Taken Time  BP 108/74 05/15/2018 10:51 AM  Temp 36.4 C 05/15/2018 10:51 AM  Pulse 80 05/15/2018 10:54 AM  Resp 24 05/15/2018 10:54 AM  SpO2 96 % 05/15/2018 10:54 AM  Vitals shown include unvalidated device data.  Last Pain:  Vitals:   05/15/18 1051  TempSrc: Oral  PainSc: 0-No pain      Patients Stated Pain Goal: 2 (25/95/63 8756)  Complications: No apparent anesthesia complications

## 2018-05-15 NOTE — Interval H&P Note (Signed)
History and Physical Interval Note:  05/15/2018 10:16 AM  Joseph Garrett  has presented today for surgery, with the diagnosis of afib  The various methods of treatment have been discussed with the patient and family. After consideration of risks, benefits and other options for treatment, the patient has consented to  Procedure(s): TRANSESOPHAGEAL ECHOCARDIOGRAM (TEE) (N/A) CARDIOVERSION (N/A) as a surgical intervention .  The patient's history has been reviewed, patient examined, no change in status, stable for surgery.  I have reviewed the patient's chart and labs.  Questions were answered to the patient's satisfaction.     [name]

## 2018-05-15 NOTE — H&P (View-Only) (Signed)
 Progress Note  Patient Name: Joseph Garrett Date of Encounter: 05/15/2018  Primary Cardiologist: Estefano Victory, MD   Subjective   No CP, aware his heart rate is faster  Inpatient Medications    Scheduled Meds: . atorvastatin  20 mg Oral q1800  . clobetasol cream   Topical BID  . diltiazem  240 mg Oral Daily  . lisinopril  5 mg Oral Daily  . rivaroxaban  20 mg Oral Q supper   Continuous Infusions:  PRN Meds: acetaminophen **OR** acetaminophen, ondansetron **OR** ondansetron (ZOFRAN) IV, oxyCODONE, zolpidem   Vital Signs    Vitals:   05/14/18 2333 05/14/18 2336 05/15/18 0349 05/15/18 0757  BP: (!) 142/99 (!) 132/96 133/87 (!) 130/96  Pulse: (!) 118  (!) 112 (!) 116  Resp: 14  16 18  Temp: 98 F (36.7 C)  98 F (36.7 C) 97.9 F (36.6 C)  TempSrc: Oral  Oral Oral  SpO2: 97%  97% 98%  Weight:      Height:        Intake/Output Summary (Last 24 hours) at 05/15/2018 0807 Last data filed at 05/14/2018 1218 Gross per 24 hour  Intake -  Output 550 ml  Net -550 ml   Filed Weights   05/13/18 1500 05/13/18 2112  Weight: (!) 139.7 kg (!) 136.4 kg    Telemetry    AFlutter 2:1 - Personally Reviewed  ECG    No new EKGs - Personally Reviewed  Physical Exam   GEN: No acute distress.   Neck: No JVD Cardiac: RRR, tachycardic, no murmurs, rubs, or gallops.  Respiratory: CTA b/l. GI: Soft, nontender, non-distended  MS: No edema; No deformity. Neuro:  Nonfocal  Psych: Normal affect   Labs    Chemistry Recent Labs  Lab 05/13/18 1428 05/13/18 1434 05/14/18 0806 05/15/18 0406  NA 137 141 140 142  K 3.8 4.0 3.8 3.8  CL 105 107 110 110  CO2 20*  --  23 25  GLUCOSE 152* 151* 123* 95  BUN 23* 25* 16 13  CREATININE 1.56* 1.60* 0.95 1.05  CALCIUM 9.2  --  8.5* 8.7*  GFRNONAA 47*  --  >60 >60  GFRAA 54*  --  >60 >60  ANIONGAP 12  --  7 7     Hematology Recent Labs  Lab 05/14/18 0225 05/14/18 0806 05/15/18 0406  WBC 10.6* 9.4 6.6  RBC 4.67 4.70  4.82  HGB 14.1 14.2 14.5  HCT 43.1 44.4 44.7  MCV 92.3 94.5 92.7  MCH 30.2 30.2 30.1  MCHC 32.7 32.0 32.4  RDW 12.9 12.9 12.8  PLT 151 169 164    Cardiac Enzymes Recent Labs  Lab 05/13/18 1946 05/14/18 0225 05/14/18 0806  TROPONINI 1.02* 1.82* 1.57*    Recent Labs  Lab 05/13/18 1432  TROPIPOC 0.25*     BNPNo results for input(s): BNP, PROBNP in the last 168 hours.   DDimer No results for input(s): DDIMER in the last 168 hours.   Radiology      Cardiac Studies   Right/Left Heart Cath and Coronary Angiography7/27/17  Conclusion  1. Normal filling pressures.  2. Nonobstructive coronary disease.  3. EF 50-55%.    2D echo 06/04/16 Study Conclusions - Left ventricle: LV longitudinal lV strain is abnormal at -12%. The cavity size was normal. Systolic function was normal. The estimated ejection fraction was in the range of 50% to 55%. Diffuse hypokinesis. - Aortic valve: Trileaflet; mildly thickened leaflets. - Right ventricle: Systolic function was moderately   reduced. - Pulmonary arteries: PA peak pressure: 51 mm Hg (S). - Pericardium, extracardiac: A small, free-flowing pericardial effusion was identified along the right ventricular free wall. The fluid had no internal echoes.There was no evidence of hemodynamic compromise. Impressions: - The right ventricular systolic pressure was increased consistent with moderate pulmonary hypertension.   Patient Profile     57 y.o. male with a hx of chronic CHF (combined) with recovered LVEF in 2017, HTN, HLD, obesity, OSA w/CPAP, psoriasis, TIA, liver cirrhosis (history list reports liver scarring, biopsy done , presumed 2/2 methotrexate, and AFib, w/ prior AFib ablations by Dr. 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 SVT, 1:1 AFlutter, rates 200's      Atypical     Wife mentioned last weekend the patient started to feel poorly and everytime she checked his HR was in the 40's  Long hx of AFib as noted above 2017 MAZE/LAA sewing CHA2DS2Vasc is 4, THE PATIENT SELF STOPPED HIS XARELTO 2 YEARS AGO >> heparin gtt here Counseled on the importance of this, they tell me the xarelto was very expensive, he had not been having AF and he made an exacutive decision to stop it, though willing to resume  Appreciated case management, patient has zero co-pay for xarelto, revisited with the patient the importance of this, he is agreeable to resume  Rates up some, he can tell We will try to get the patient TEE/DCCV today, though currently no available slot, he is NPO and will be done if a cancellation on the schedule occurs.  Patient is happy to remain NPO this AM in hopes we get a spot Continue PO dilt, rate mostly 110-120    2. Diffuse ST changes w/SVT resolved with rate control     Abn Trop, peaked at 1.82, this AM 1.57     No on-going CO, felt to be 2/2 tachycardia, s/p cath with no obst CAD 2017  3. HTN     Better  4. URI     Per IM     For questions or updates, please contact [facility] Please consult [email or web address] for contact info under        Signed, Renee Lynn Ursuy, PA-C  05/15/2018, 8:07 AM     I have seen, examined the patient, and reviewed the above assessment and plan.  Changes to above are made where necessary.  On exam, iRRR.  Will plan cardioversion this am.  Follow-up in AF clinic next week.  BP is stable.  Ok to discharge post cardioversion if no concerns.  Co Sign: Dacen Frayre, MD 05/15/2018 10:14 AM   

## 2018-05-15 NOTE — Discharge Summary (Signed)
Physician Discharge Summary  Joseph Garrett ZOX:[id number] DOB: 1961/06/07 DOA: 05/13/2018  PCP: No primary care provider on file.  Admit date: 05/13/2018 Discharge date: 05/15/2018  Time spent: 45 minutes  Recommendations for Outpatient Follow-up:  EP clinic on 11/25  Discharge Diagnoses:  Principal Problem:   SVT (supraventricular tachycardia) (HCC)   Atrial flutter and fibrillation   OSA (obstructive sleep apnea)   Persistent atrial fibrillation (HCC)   Essential hypertension   S/P Minimally invasive maze operation for atrial fibrillation   AKI (acute kidney injury) (HCC)   URI (upper respiratory infection)   Discharge Condition: stable  Diet recommendation: heart healthy  Filed Weights   05/13/18 1500 05/13/18 2112  Weight: (!) 139.7 kg (!) 136.4 kg    History of present illness:  Mr.Osmannhas hx of persistent AFib historically w/ prior AFib ablations by Dr. [name] 2013, 2014, and subsequently minimally invasive MAZE/Endocardial oversewingof leftatrialappendagein 2017 by [name]. The patient last saw Dr. Allred jan 2018, was doing well, his amiodarone stopped to continue life long a/c given TIA hx, saw AF clinic in April 20-18 off amio, without symptoms of AF  Hospital Course:    SVT (supraventricular tachycardia) with some atrial flutter/A. Fib -Started on a Cardizem drip initially, subsequently transitioned to oral Cardizem -Long history of A. fib, history of maze procedure in 2017, stopped Xarelto 2 years ago without consulting MD -Seen by EP, IV heparin subsequently underwent TEE and DC cardioversion this morning -Converted to sinus rhythm in the 70s post cardioversion with stable blood pressure -Now started on Xarelto 20 mg daily, he was given coupons and assistance with medications -Discharged in a stable condition on oral Cardizem and Xarelto with close EP follow-up on 11/25  Elevated troponin -Likely secondary to demand from SVT and  tachycardia -Left heart cath in 7/201 9 with nonobstructive CAD -Stable, no ACS  URI/recent in the last week or so -Supportive care, afebrile, no leukocytosis at this time, respiratory virus panel is negative  Obstructive sleep apnea -Continue CPAP  Acute kidney injury -due to tachycardia/hypotension on admission, now resolved -Lisinopril restarted  Procedures:  TEE and DCCV  Consultations: EP Discharge Exam: Vitals:   05/15/18 1051 05/15/18 1102  BP: 108/74 107/76  Pulse: 76 76  Resp: (!) 22 19  Temp: 97.6 F (36.4 C)   SpO2: 97% 96%    General: AAOx3 Cardiovascular: S1S2/RRR Respiratory: CTAB  Discharge Instructions    Allergies as of 05/15/2018      Reactions   Penicillins Rash   Has patient had a PCN reaction causing immediate rash, facial/tongue/throat swelling, SOB or lightheadedness with hypotension: Yes Has patient had a PCN reaction causing severe rash involving mucus membranes or skin necrosis: No Has patient had a PCN reaction that required hospitalization No Has patient had a PCN reaction occurring within the last 10 years: No If all of the above answers are "NO", then may proceed with Cephalosporin use.      Medication List    STOP taking these medications   ibuprofen 200 MG tablet Commonly known as:  ADVIL,MOTRIN   meloxicam 15 MG tablet Commonly known as:  MOBIC     TAKE these medications   carvedilol 3.125 MG tablet Commonly known as:  COREG Take 1 tablet (3.125 mg) as need when BP remain above 140/90.   diclofenac sodium 1 % Gel Commonly known as:  VOLTAREN Apply 4 g topically 4 (four) times daily.   diltiazem 240 MG 24 hr capsule Commonly known as:  CARDIZEM CD Take 1 capsule (240 mg total) by mouth daily. Start taking on:  05/16/2018   ergocalciferol 1.25 MG (50000 UT) capsule Commonly known as:  VITAMIN D2 Take 50,000 Units by mouth once a week.   halobetasol 0.05 % cream Commonly known as:  ULTRAVATE Apply 0.5 g  topically 2 (two) times daily.   lisinopril 5 MG tablet Commonly known as:  PRINIVIL,ZESTRIL Take 5 mg by mouth daily.   milk thistle 175 MG tablet Take 175 mg by mouth daily.   oxyCODONE 15 MG immediate release tablet Commonly known as:  ROXICODONE Take 15 mg by mouth daily as needed for pain. Take 1 tablet daily as needed   oxymetazoline 0.05 % nasal spray Commonly known as:  AFRIN Place 2 sprays into both nostrils 2 (two) times daily as needed for congestion.   rivaroxaban 20 MG Tabs tablet Commonly known as:  XARELTO Take 1 tablet (20 mg total) by mouth daily with supper.   sertraline 100 MG tablet Commonly known as:  ZOLOFT Take 150 mg by mouth every morning.   simvastatin 40 MG tablet Commonly known as:  ZOCOR Take 40 mg by mouth daily.   zolpidem 10 MG tablet Commonly known as:  AMBIEN Take 10 mg by mouth at bedtime as needed for sleep.      Allergies  Allergen Reactions  . Penicillins Rash    Has patient had a PCN reaction causing immediate rash, facial/tongue/throat swelling, SOB or lightheadedness with hypotension: Yes Has patient had a PCN reaction causing severe rash involving mucus membranes or skin necrosis: No Has patient had a PCN reaction that required hospitalization No Has patient had a PCN reaction occurring within the last 10 years: No If all of the above answers are "NO", then may proceed with Cephalosporin use.    Follow-up Information    [facility] Follow up.   Specialty:  Cardiology Why:  05/25/18 @ 10:30AM Contact information: [address] [id number] mc [address] [place] [address] [phone]       PCP. Schedule an appointment as soon as possible for a visit in 1 week(s).            The results of significant diagnostics from this hospitalization (including imaging, microbiology, ancillary and laboratory) are listed below for reference.    Significant Diagnostic Studies: Dg Chest 2  View  Result Date: 05/14/2018 CLINICAL DATA:  Upper respiratory infection. EXAM: CHEST - 2 VIEW COMPARISON:  05/13/2018 FINDINGS: The cardiac silhouette is borderline enlarged. There is persistent eventration of the right hemidiaphragm. No airspace consolidation, edema, pleural effusion, or pneumothorax is identified. No acute osseous abnormality is seen. IMPRESSION: No active cardiopulmonary disease. Electronically Signed   By: Sebastian Ache M.D.   On: 05/14/2018 08:57   Dg Chest Portable 1 View  Result Date: 05/13/2018 CLINICAL DATA:  Chest pain EXAM: PORTABLE CHEST 1 VIEW COMPARISON:  04/22/2016 CXR, chest CT 04/23/2016 FINDINGS: Chronic eventration of the right hemidiaphragm. Stable cardiomegaly with minimal aortic atherosclerosis. No pulmonary consolidation or CHF. No effusion or pneumothorax. No acute osseous appearing abnormality. IMPRESSION: No active disease. Electronically Signed   By: Tollie Eth M.D.   On: 05/13/2018 14:40    Microbiology: Recent Results (from the past 240 hour(s))  Respiratory Panel by PCR     Status: None   Collection Time: 05/13/18  9:52 PM  Result Value Ref Range Status   Adenovirus NOT DETECTED NOT DETECTED Final   Coronavirus 229E NOT DETECTED NOT DETECTED  Final   Coronavirus HKU1 NOT DETECTED NOT DETECTED Final   Coronavirus NL63 NOT DETECTED NOT DETECTED Final   Coronavirus OC43 NOT DETECTED NOT DETECTED Final   Metapneumovirus NOT DETECTED NOT DETECTED Final   Rhinovirus / Enterovirus NOT DETECTED NOT DETECTED Final   Influenza A NOT DETECTED NOT DETECTED Final   Influenza B NOT DETECTED NOT DETECTED Final   Parainfluenza Virus 1 NOT DETECTED NOT DETECTED Final   Parainfluenza Virus 2 NOT DETECTED NOT DETECTED Final   Parainfluenza Virus 3 NOT DETECTED NOT DETECTED Final   Parainfluenza Virus 4 NOT DETECTED NOT DETECTED Final   Respiratory Syncytial Virus NOT DETECTED NOT DETECTED Final   Bordetella pertussis NOT DETECTED NOT DETECTED Final    Chlamydophila pneumoniae NOT DETECTED NOT DETECTED Final   Mycoplasma pneumoniae NOT DETECTED NOT DETECTED Final    Comment: Performed at [facility], [address], [place], [place] [address]  MRSA PCR Screening     Status: None   Collection Time: 05/13/18  9:53 PM  Result Value Ref Range Status   MRSA by PCR NEGATIVE NEGATIVE Final    Comment:        The GeneXpert MRSA Assay (FDA approved for NASAL specimens only), is one component of a comprehensive MRSA colonization surveillance program. It is not intended to diagnose MRSA infection nor to guide or monitor treatment for MRSA infections. Performed at [facility], [address], [place], [place] [address]      Labs: Basic Metabolic Panel: Recent Labs  Lab 05/13/18 1428 05/13/18 1434 05/14/18 0806 05/15/18 0406  NA 137 141 140 142  K 3.8 4.0 3.8 3.8  CL 105 107 110 110  CO2 20*  --  23 25  GLUCOSE 152* 151* 123* 95  BUN 23* 25* 16 13  CREATININE 1.56* 1.60* 0.95 1.05  CALCIUM 9.2  --  8.5* 8.7*  MG 2.4  --   --   --    Liver Function Tests: No results for input(s): AST, ALT, ALKPHOS, BILITOT, PROT, ALBUMIN in the last 168 hours. No results for input(s): LIPASE, AMYLASE in the last 168 hours. No results for input(s): AMMONIA in the last 168 hours. CBC: Recent Labs  Lab 05/13/18 1428 05/13/18 1434 05/14/18 0225 05/14/18 0806 05/15/18 0406  WBC 17.4*  --  10.6* 9.4 6.6  HGB 16.2 16.3 14.1 14.2 14.5  HCT 49.3 48.0 43.1 44.4 44.7  MCV 93.7  --  92.3 94.5 92.7  PLT 270  --  151 169 164   Cardiac Enzymes: Recent Labs  Lab 05/13/18 1946 05/14/18 0225 05/14/18 0806  TROPONINI 1.02* 1.82* 1.57*   BNP: BNP (last 3 results) No results for input(s): BNP in the last 8760 hours.  ProBNP (last 3 results) No results for input(s): PROBNP in the last 8760 hours.  CBG: No results for input(s): GLUCAP in the last 168 hours.     Signed:  Zannie Cove MD.  [facility] 05/15/2018,  1:58 PM

## 2018-05-15 NOTE — Progress Notes (Signed)
Readout from patient's monitor while ambulating. HR in the 90s with occasional PVCs.   05/15/18 1400  Vitals  ECG Heart Rate 91  Oxygen Therapy  SpO2 100 %

## 2018-05-15 NOTE — Progress Notes (Addendum)
Progress Note  Patient Name: Joseph Garrett Date of Encounter: 05/15/2018  Primary Cardiologist: Hillis Range, MD   Subjective   No CP, aware his heart rate is faster  Inpatient Medications    Scheduled Meds: . atorvastatin  20 mg Oral q1800  . clobetasol cream   Topical BID  . diltiazem  240 mg Oral Daily  . lisinopril  5 mg Oral Daily  . rivaroxaban  20 mg Oral Q supper   Continuous Infusions:  PRN Meds: acetaminophen **OR** acetaminophen, ondansetron **OR** ondansetron (ZOFRAN) IV, oxyCODONE, zolpidem   Vital Signs    Vitals:   05/14/18 2333 05/14/18 2336 05/15/18 0349 05/15/18 0757  BP: (!) 142/99 (!) 132/96 133/87 (!) 130/96  Pulse: (!) 118  (!) 112 (!) 116  Resp: 14  16 18   Temp: 98 F (36.7 C)  98 F (36.7 C) 97.9 F (36.6 C)  TempSrc: Oral  Oral Oral  SpO2: 97%  97% 98%  Weight:      Height:        Intake/Output Summary (Last 24 hours) at 05/15/2018 8022 Last data filed at 05/14/2018 1218 Gross per 24 hour  Intake -  Output 550 ml  Net -550 ml   Filed Weights   05/13/18 1500 05/13/18 2112  Weight: (!) 139.7 kg (!) 136.4 kg    Telemetry    AFlutter 2:1 - Personally Reviewed  ECG    No new EKGs - Personally Reviewed  Physical Exam   GEN: No acute distress.   Neck: No JVD Cardiac: RRR, tachycardic, no murmurs, rubs, or gallops.  Respiratory: CTA b/l. GI: Soft, nontender, non-distended  MS: No edema; No deformity. Neuro:  Nonfocal  Psych: Normal affect   Labs    Chemistry Recent Labs  Lab 05/13/18 1428 05/13/18 1434 05/14/18 0806 05/15/18 0406  NA 137 141 140 142  K 3.8 4.0 3.8 3.8  CL 105 107 110 110  CO2 20*  --  23 25  GLUCOSE 152* 151* 123* 95  BUN 23* 25* 16 13  CREATININE 1.56* 1.60* 0.95 1.05  CALCIUM 9.2  --  8.5* 8.7*  GFRNONAA 47*  --  >60 >60  GFRAA 54*  --  >60 >60  ANIONGAP 12  --  7 7     Hematology Recent Labs  Lab 05/14/18 0225 05/14/18 0806 05/15/18 0406  WBC 10.6* 9.4 6.6  RBC 4.67 4.70  4.82  HGB 14.1 14.2 14.5  HCT 43.1 44.4 44.7  MCV 92.3 94.5 92.7  MCH 30.2 30.2 30.1  MCHC 32.7 32.0 32.4  RDW 12.9 12.9 12.8  PLT 151 169 164    Cardiac Enzymes Recent Labs  Lab 05/13/18 1946 05/14/18 0225 05/14/18 0806  TROPONINI 1.02* 1.82* 1.57*    Recent Labs  Lab 05/13/18 1432  TROPIPOC 0.25*     BNPNo results for input(s): BNP, PROBNP in the last 168 hours.   DDimer No results for input(s): DDIMER in the last 168 hours.   Radiology      Cardiac Studies   Right/Left Heart Cath and Coronary Angiography7/27/17  Conclusion  1. Normal filling pressures.  2. Nonobstructive coronary disease.  3. EF 50-55%.    2D echo 06/04/16 Study Conclusions - Left ventricle: LV longitudinal lV strain is abnormal at -12%. The cavity size was normal. Systolic function was normal. The estimated ejection fraction was in the range of 50% to 55%. Diffuse hypokinesis. - Aortic valve: Trileaflet; mildly thickened leaflets. - Right ventricle: Systolic function was moderately  reduced. - Pulmonary arteries: PA peak pressure: 51 mm Hg (S). - Pericardium, extracardiac: A small, free-flowing pericardial effusion was identified along the right ventricular free wall. The fluid had no internal echoes.There was no evidence of hemodynamic compromise. Impressions: - The right ventricular systolic pressure was increased consistent with moderate pulmonary hypertension.   Patient Profile     57 y.o. male with a hx of chronic CHF (combined) with recovered LVEF in 2017, HTN, HLD, obesity, OSA w/CPAP, psoriasis, TIA, liver cirrhosis (history list reports liver scarring, biopsy done , presumed 2/2 methotrexate, and AFib, w/ prior AFib ablations by Dr. [name] 2013, 2014, and subsequently minimally invasive MAZE/Endocardial oversewingof leftatrialappendage in 2017   Admitted with possible viral URI, and SVT  Assessment & Plan    1. SVT, 1:1 AFlutter, rates 200's      Atypical     Wife mentioned last weekend the patient started to feel poorly and everytime she checked his HR was in the 40's  Long hx of AFib as noted above 2017 MAZE/LAA sewing CHA2DS2Vasc is 4, THE PATIENT SELF STOPPED HIS XARELTO 2 YEARS AGO >> heparin gtt here Counseled on the importance of this, they tell me the xarelto was very expensive, he had not been having AF and he made an exacutive decision to stop it, though willing to resume  Appreciated case management, patient has zero co-pay for xarelto, revisited with the patient the importance of this, he is agreeable to resume  Rates up some, he can tell We will try to get the patient TEE/DCCV today, though currently no available slot, he is NPO and will be done if a cancellation on the schedule occurs.  Patient is happy to remain NPO this AM in hopes we get a spot Continue PO dilt, rate mostly 110-120    2. Diffuse ST changes w/SVT resolved with rate control     Abn Trop, peaked at 1.82, this AM 1.57     No on-going CO, felt to be 2/2 tachycardia, s/p cath with no obst CAD 2017  3. HTN     Better  4. URI     Per IM     For questions or updates, please contact [facility] Please consult [email or web address] for contact info under        Signed, Sheilah Pigeon, PA-C  05/15/2018, 8:07 AM     I have seen, examined the patient, and reviewed the above assessment and plan.  Changes to above are made where necessary.  On exam, iRRR.  Will plan cardioversion this am.  Follow-up in AF clinic next week.  BP is stable.  Ok to discharge post cardioversion if no concerns.  Co Sign: Hillis Range, MD 05/15/2018 10:14 AM

## 2018-05-15 NOTE — Anesthesia Preprocedure Evaluation (Addendum)
Anesthesia Evaluation  Patient identified by MRN, date of birth, ID band Patient awake    Reviewed: Allergy & Precautions, NPO status , Patient's Chart, lab work & pertinent test results, reviewed documented beta blocker date and time   History of Anesthesia Complications Negative for: history of anesthetic complications  Airway Mallampati: II  TM Distance: >3 FB Neck ROM: Full    Dental no notable dental hx.    Pulmonary sleep apnea ,    Pulmonary exam normal        Cardiovascular hypertension, Pt. on medications and Pt. on home beta blockers + CAD (nonobstructive per 2017 [facility]) and +CHF  + dysrhythmias Atrial Fibrillation  Rhythm:Irregular Rate:Normal  TTE 2017 Study Conclusions  - Left ventricle: LV longitudinal lV strain is abnormal at -12%.   The cavity size was normal. Systolic function was normal. The   estimated ejection fraction was in the range of 50% to 55%.   Diffuse hypokinesis. - Aortic valve: Trileaflet; mildly thickened leaflets. - Right ventricle: Systolic function was moderately reduced. - Pulmonary arteries: PA peak pressure: 51 mm Hg (S). - Pericardium, extracardiac: A small, free-flowing pericardial   effusion was identified along the right ventricular free wall.   The fluid had no internal echoes.There was no evidence of   hemodynamic compromise.  Impressions:  - The right ventricular systolic pressure was increased consistent   with moderate pulmonary hypertension.   Neuro/Psych TIAnegative psych ROS   GI/Hepatic GERD  ,(+) Cirrhosis       ,   Endo/Other  negative endocrine ROS  Renal/GU negative Renal ROS  negative genitourinary   Musculoskeletal negative musculoskeletal ROS (+)   Abdominal   Peds  Hematology negative hematology ROS (+)   Anesthesia Other Findings   Reproductive/Obstetrics                            Anesthesia Physical Anesthesia  Plan  ASA: III  Anesthesia Plan: General   Post-op Pain Management:    Induction: Intravenous  PONV Risk Score and Plan: 2 and Propofol infusion, TIVA and Treatment may vary due to age or medical condition  Airway Management Planned: Mask, Nasal Cannula and Simple Face Mask  Additional Equipment: None  Intra-op Plan:   Post-operative Plan:   Informed Consent: I have reviewed the patients History and Physical, chart, labs and discussed the procedure including the risks, benefits and alternatives for the proposed anesthesia with the patient or authorized representative who has indicated his/her understanding and acceptance.     Plan Discussed with:   Anesthesia Plan Comments:        Anesthesia Quick Evaluation

## 2018-05-18 ENCOUNTER — Encounter (HOSPITAL_COMMUNITY): Payer: Self-pay | Admitting: Internal Medicine

## 2018-05-18 NOTE — Anesthesia Postprocedure Evaluation (Signed)
Anesthesia Post Note  Patient: Joseph Garrett  Procedure(s) Performed: TRANSESOPHAGEAL ECHOCARDIOGRAM (TEE) (N/A ) CARDIOVERSION (N/A )     Patient location during evaluation: PACU Anesthesia Type: General Level of consciousness: awake and alert Pain management: pain level controlled Vital Signs Assessment: post-procedure vital signs reviewed and stable Respiratory status: spontaneous breathing, nonlabored ventilation and respiratory function stable Cardiovascular status: blood pressure returned to baseline and stable Postop Assessment: no apparent nausea or vomiting Anesthetic complications: no    Last Vitals:  Vitals:   05/15/18 1215 05/15/18 1400  BP: 127/85   Pulse:    Resp:    Temp:    SpO2: 99% 100%    Last Pain:  Vitals:   05/15/18 1102  TempSrc:   PainSc: 0-No pain   Pain Goal: Patients Stated Pain Goal: 2 (05/14/18 1133)               [name]

## 2018-05-25 ENCOUNTER — Encounter (HOSPITAL_COMMUNITY): Payer: Self-pay | Admitting: Nurse Practitioner

## 2018-05-25 ENCOUNTER — Ambulatory Visit (HOSPITAL_COMMUNITY)
Admit: 2018-05-25 | Discharge: 2018-05-25 | Disposition: A | Discharge status: Home / Self Care | Payer: BLUE CROSS/BLUE SHIELD | Attending: Nurse Practitioner | Admitting: Nurse Practitioner

## 2018-05-25 VITALS — BP 108/72 | HR 67 | Ht 74.0 in | Wt 311.0 lb

## 2018-05-25 DIAGNOSIS — K219 Gastro-esophageal reflux disease without esophagitis: Secondary | ICD-10-CM | POA: Insufficient documentation

## 2018-05-25 DIAGNOSIS — I493 Ventricular premature depolarization: Secondary | ICD-10-CM | POA: Insufficient documentation

## 2018-05-25 DIAGNOSIS — I5042 Chronic combined systolic (congestive) and diastolic (congestive) heart failure: Secondary | ICD-10-CM | POA: Diagnosis not present

## 2018-05-25 DIAGNOSIS — I110 Hypertensive heart disease with heart failure: Secondary | ICD-10-CM | POA: Diagnosis not present

## 2018-05-25 DIAGNOSIS — E669 Obesity, unspecified: Secondary | ICD-10-CM | POA: Diagnosis not present

## 2018-05-25 DIAGNOSIS — Z7901 Long term (current) use of anticoagulants: Secondary | ICD-10-CM | POA: Insufficient documentation

## 2018-05-25 DIAGNOSIS — Z79899 Other long term (current) drug therapy: Secondary | ICD-10-CM | POA: Diagnosis not present

## 2018-05-25 DIAGNOSIS — M109 Gout, unspecified: Secondary | ICD-10-CM | POA: Insufficient documentation

## 2018-05-25 DIAGNOSIS — M1990 Unspecified osteoarthritis, unspecified site: Secondary | ICD-10-CM | POA: Insufficient documentation

## 2018-05-25 DIAGNOSIS — G4733 Obstructive sleep apnea (adult) (pediatric): Secondary | ICD-10-CM | POA: Diagnosis not present

## 2018-05-25 DIAGNOSIS — Z8249 Family history of ischemic heart disease and other diseases of the circulatory system: Secondary | ICD-10-CM | POA: Insufficient documentation

## 2018-05-25 DIAGNOSIS — F329 Major depressive disorder, single episode, unspecified: Secondary | ICD-10-CM | POA: Diagnosis not present

## 2018-05-25 DIAGNOSIS — I480 Paroxysmal atrial fibrillation: Secondary | ICD-10-CM | POA: Insufficient documentation

## 2018-05-25 DIAGNOSIS — E7800 Pure hypercholesterolemia, unspecified: Secondary | ICD-10-CM | POA: Diagnosis not present

## 2018-05-25 DIAGNOSIS — I4819 Other persistent atrial fibrillation: Secondary | ICD-10-CM | POA: Diagnosis not present

## 2018-05-25 DIAGNOSIS — I4891 Unspecified atrial fibrillation: Secondary | ICD-10-CM | POA: Diagnosis present

## 2018-05-25 DIAGNOSIS — Z8673 Personal history of transient ischemic attack (TIA), and cerebral infarction without residual deficits: Secondary | ICD-10-CM | POA: Diagnosis not present

## 2018-05-25 DIAGNOSIS — I4892 Unspecified atrial flutter: Secondary | ICD-10-CM | POA: Diagnosis not present

## 2018-05-25 DIAGNOSIS — K7460 Unspecified cirrhosis of liver: Secondary | ICD-10-CM | POA: Insufficient documentation

## 2018-05-25 IMAGING — CR DG CHEST 1V PORT
1 series · 1 of 1 positions shown · non-contrast
Comparison: 03/22/2016

CLINICAL DATA: Pt has 2 chest tubes placed in RT lung. Minimallly
invasive MAZE procedure no [DATE]. Hx of CHF, HTN, TIA, non-smoker.

EXAM:
PORTABLE CHEST 1 VIEW

[AP]
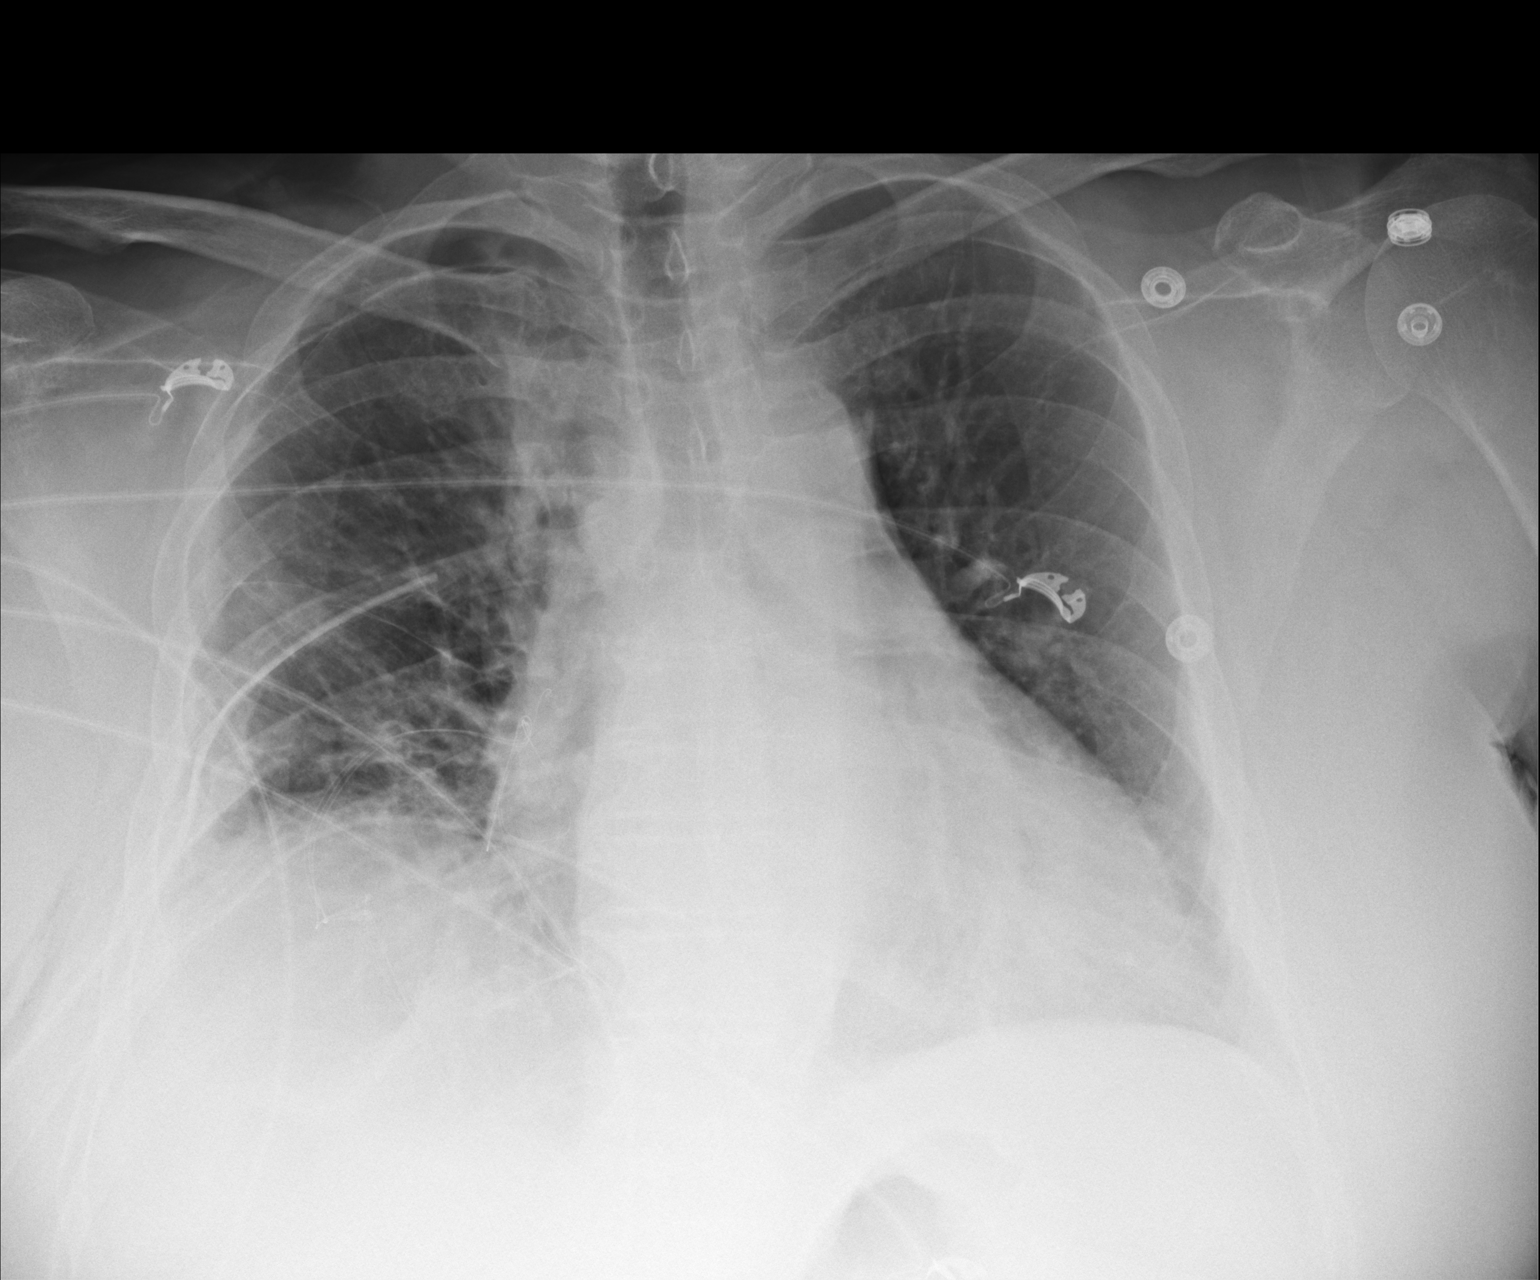

[1 of 1 positions shown; findings below may reference images not displayed]

FINDINGS: Removal of left IJ Cordis sheath. Midline trachea. Cardiomegaly
accentuated by AP portable technique. Moderate right hemidiaphragm
elevation. No left and no definite right pleural effusion. There is
minimal subcutaneous emphysema about the right chest wall. Right
chest tubes remain in place with an approximately 5% right apical
pneumothorax identified.

No congestive failure.  Persistent right base atelectasis.
IMPRESSION: Right chest tubes remaining in place with development of an
approximately 5% right apical pneumothorax. These results will be
called to the ordering clinician or representative by the
Radiologist Assistant, and communication documented in the PACS or
[facility]Vision Dashboard.

Similar right base volume loss with atelectasis.

## 2018-05-25 NOTE — Progress Notes (Signed)
Primary Care Physician: No primary care provider on file. Referring Physician: [facility] f/u EP: Dr. Donne [name] is a 57 y.o. male with a h/o  persistent AFib historically w/ prior AFib ablations by Dr. [name] 2013, 2014, and subsequently minimally invasive MAZE/Endocardial oversewingof leftatrialappendagein 2017 by [name]. The patient last saw Dr. Allred jan 2018, was doing well, his amiodarone stopped to continue life long a/c given TIA hx, saw AF clinic in April 20-18 off amio, without symptoms of AF.  He presented to [facility] 11/15 with SVT/afib/flutter. HE was treated with Cardizem drip and subsequently transitioned to oral Cardizem. He had been off xarelto x 2 years stopped on his own accord. He ultimately had A TEE cardioversion and converted to SR.   He is now in afib clinic for f/u and is continuing in SR. He had been on a prednisone taper that may have been the trigger.    Today, he denies symptoms of palpitations, chest pain, shortness of breath, orthopnea, PND, lower extremity edema, dizziness, presyncope, syncope, or neurologic sequela. The patient is tolerating medications without difficulties and is otherwise without complaint today.   Past Medical History:  Diagnosis Date  . CHF (congestive heart failure) (HCC)   . Chronic combined systolic and diastolic CHF (congestive heart failure) (HCC)   . Cirrhosis (HCC)    liver scarring on biopsy, presumed to be due to methotrexate  . Depression   . DJD (degenerative joint disease)   . Essential hypertension 08/30/2013  . GERD (gastroesophageal reflux disease)    not present  . Gout   . Hives   . Hypercholesteremia   . Hypertension   . Mitral regurgitation - type I dysfunction - mild to moderate   . Obesity   . OSA (obstructive sleep apnea)    mild, uses CPAP  . Paroxysmal atrial fibrillation (HCC)   . Persistent atrial fibrillation 03/02/2012  . Psoriasis   . S/P Minimally invasive maze operation for atrial  fibrillation 03/20/2016   Complete bilateral atrial lesion set using cryothermy and bipolar radiofrequency ablation via right mini thoracotomy approach with oversewing of LA appendage  . Shortness of breath dyspnea    exertion  . Syncope 02/21/2014  . Thrombocytopenia (HCC)   . TIA (transient ischemic attack)    Past Surgical History:  Procedure Laterality Date  . ABLATION OF DYSRHYTHMIC FOCUS  09/17/2012  . ATRIAL FIBRILLATION ABLATION  03/17/12   PVI and CTI ablation by Dr [name]  . ATRIAL FIBRILLATION ABLATION N/A 03/17/2012   Procedure: ATRIAL FIBRILLATION ABLATION;  Surgeon: Hillis Range, MD;  Location: [facility] CATH LAB;  Service: Cardiovascular;  Laterality: N/A;  . ATRIAL FIBRILLATION ABLATION N/A 09/18/2012   Procedure: ATRIAL FIBRILLATION ABLATION;  Surgeon: Hillis Range, MD;  Location: [facility] CATH LAB;  Service: Cardiovascular;  Laterality: N/A;  . CARDIAC CATHETERIZATION N/A 01/25/2016   Procedure: Right/Left Heart Cath and Coronary Angiography;  Surgeon: Laurey Morale, MD;  Location: [facility] INVASIVE CV LAB;  Service: Cardiovascular;  Laterality: N/A;  . CARDIOVERSION N/A 10/09/2012   Procedure: CARDIOVERSION;  Surgeon: Laurey Morale, MD;  Location: [facility] ENDOSCOPY;  Service: Cardiovascular;  Laterality: N/A;  . CARDIOVERSION N/A 02/22/2014   Procedure: CARDIOVERSION;  Surgeon: Quintella Reichert, MD;  Location: [facility] ENDOSCOPY;  Service: Cardiovascular;  Laterality: N/A;  . CARDIOVERSION N/A 12/29/2015   Procedure: CARDIOVERSION;  Surgeon: Hillis Range, MD;  Location: [facility] OR;  Service: Cardiovascular;  Laterality: N/A;  . CARDIOVERSION N/A 05/15/2018   Procedure: CARDIOVERSION;  Surgeon: [name], MD;  Location: [facility] ENDOSCOPY;  Service: Cardiovascular;  Laterality: N/A;  . CHOLECYSTECTOMY  2011  . ELBOW ARTHROSCOPY Left 08/2014  . ELECTROPHYSIOLOGIC STUDY N/A 12/29/2015   Procedure: Cardioversion;  Surgeon: Hillis Range, MD;  Location: [facility] INVASIVE CV LAB;  Service: Cardiovascular;  Laterality: N/A;    . EYE SURGERY Bilateral 04/2015   cataract surgery  . KNEE ARTHROSCOPY Right   . MINIMALLY INVASIVE MAZE PROCEDURE N/A 03/20/2016   Procedure: MINIMALLY INVASIVE MAZE PROCEDURE;  Surgeon: Purcell Nails, MD;  Location: [facility] OR;  Service: Open Heart Surgery;  Laterality: N/A;  . NOSE SURGERY     Turbinates  . RECONSTRUCTION OF NOSE    . TEE WITHOUT CARDIOVERSION  03/16/2012   Procedure: TRANSESOPHAGEAL ECHOCARDIOGRAM (TEE);  Surgeon: [name], MD;  Location: [facility] ENDOSCOPY;  Service: Cardiovascular;  Laterality: N/A;  . TEE WITHOUT CARDIOVERSION N/A 09/17/2012   Procedure: TRANSESOPHAGEAL ECHOCARDIOGRAM (TEE);  Surgeon: Lewayne Bunting, MD;  Location: [facility] ENDOSCOPY;  Service: Cardiovascular;  Laterality: N/A;  . TEE WITHOUT CARDIOVERSION N/A 01/25/2016   Procedure: TRANSESOPHAGEAL ECHOCARDIOGRAM (TEE);  Surgeon: Laurey Morale, MD;  Location: [facility] ENDOSCOPY;  Service: Cardiovascular;  Laterality: N/A;  . TEE WITHOUT CARDIOVERSION N/A 03/20/2016   Procedure: TRANSESOPHAGEAL ECHOCARDIOGRAM (TEE);  Surgeon: Purcell Nails, MD;  Location: [facility] OR;  Service: Open Heart Surgery;  Laterality: N/A;  . TEE WITHOUT CARDIOVERSION N/A 05/15/2018   Procedure: TRANSESOPHAGEAL ECHOCARDIOGRAM (TEE);  Surgeon: [name], MD;  Location: [facility] ENDOSCOPY;  Service: Cardiovascular;  Laterality: N/A;  . TENDON REPAIR     Right Forearm  . TENDON REPAIR Right 1990's   forearm    Current Outpatient Medications  Medication Sig Dispense Refill  . carvedilol (COREG) 3.125 MG tablet Take 1 tablet (3.125 mg) as need when BP remain above 140/90.    Marland Kitchen diclofenac sodium (VOLTAREN) 1 % GEL Apply 4 g topically 4 (four) times daily.    Marland Kitchen diltiazem (CARDIZEM CD) 240 MG 24 hr capsule Take 1 capsule (240 mg total) by mouth daily. 30 capsule 0  . ergocalciferol (VITAMIN D2) 1.25 MG (50000 UT) capsule Take 50,000 Units by mouth once a week.    . halobetasol (ULTRAVATE) 0.05 % cream Apply 0.5 g topically 2 (two) times daily.    Marland Kitchen  lisinopril (PRINIVIL,ZESTRIL) 5 MG tablet Take 5 mg by mouth daily.    . milk thistle 175 MG tablet Take 175 mg by mouth daily.    Marland Kitchen oxyCODONE (ROXICODONE) 15 MG immediate release tablet Take 15 mg by mouth daily as needed for pain. Take 1 tablet daily as needed    . oxymetazoline (AFRIN) 0.05 % nasal spray Place 2 sprays into both nostrils 2 (two) times daily as needed for congestion.    . rivaroxaban (XARELTO) 20 MG TABS tablet Take 1 tablet (20 mg total) by mouth daily with supper. 30 tablet 1  . sertraline (ZOLOFT) 100 MG tablet Take 150 mg by mouth every morning.     . simvastatin (ZOCOR) 40 MG tablet Take 40 mg by mouth daily.    Marland Kitchen zolpidem (AMBIEN) 10 MG tablet Take 10 mg by mouth at bedtime as needed for sleep.     No current facility-administered medications for this encounter.     Allergies  Allergen Reactions  . Penicillins Rash    Has patient had a PCN reaction causing immediate rash, facial/tongue/throat swelling, SOB or lightheadedness with hypotension: Yes Has patient had a PCN reaction causing severe  rash involving mucus membranes or skin necrosis: No Has patient had a PCN reaction that required hospitalization No Has patient had a PCN reaction occurring within the last 10 years: No If all of the above answers are "NO", then may proceed with Cephalosporin use.     Social History   Socioeconomic History  . Marital status: Married    Spouse name: Not on file  . Number of children: Not on file  . Years of education: Not on file  . Highest education level: Not on file  Occupational History  . Not on file  Social Needs  . Financial resource strain: Not on file  . Food insecurity:    Worry: Not on file    Inability: Not on file  . Transportation needs:    Medical: Not on file    Non-medical: Not on file  Tobacco Use  . Smoking status: Never Smoker  . Smokeless tobacco: Never Used  Substance and Sexual Activity  . Alcohol use: No  . Drug use: No  . Sexual  activity: Not on file  Lifestyle  . Physical activity:    Days per week: Not on file    Minutes per session: Not on file  . Stress: Not on file  Relationships  . Social connections:    Talks on phone: Not on file    Gets together: Not on file    Attends religious service: Not on file    Active member of club or organization: Not on file    Attends meetings of clubs or organizations: Not on file    Relationship status: Not on file  . Intimate partner violence:    Fear of current or ex partner: Not on file    Emotionally abused: Not on file    Physically abused: Not on file    Forced sexual activity: Not on file  Other Topics Concern  . Not on file  Social History Narrative   Lives in [place] with spouse.  3 children are healthy.     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    Family History  Problem Relation Age of Onset  . Heart attack Father   . Heart disease Unknown   . Diabetes Unknown     ROS- All systems are reviewed and negative except as per the HPI above  Physical Exam: Vitals:   05/25/18 1015  BP: 108/72  Pulse: 67  Weight: (!) 141.1 kg  Height: 6\' 2"  (1.88 m)   Wt Readings from Last 3 Encounters:  05/25/18 (!) 141.1 kg  05/13/18 (!) 136.4 kg  04/07/17 131.5 kg    Labs: Lab Results  Component Value Date   NA 142 05/15/2018   K 3.8 05/15/2018   CL 110 05/15/2018   CO2 25 05/15/2018   GLUCOSE 95 05/15/2018   BUN 13 05/15/2018   CREATININE 1.05 05/15/2018   CALCIUM 8.7 (L) 05/15/2018   MG 2.4 05/13/2018   Lab Results  Component Value Date   INR 1.36 03/20/2016   No results found for: CHOL, HDL, LDLCALC, TRIG   GEN- The patient is well appearing, alert and oriented x 3 today.   Head- normocephalic, atraumatic Eyes-  Sclera clear, conjunctiva pink Ears- hearing intact Oropharynx- clear Neck- supple, no JVP Lymph- no cervical lymphadenopathy Lungs- Clear to ausculation bilaterally, normal work of breathing Heart-  Regular rate and rhythm, no murmurs, rubs or gallops, PMI not laterally displaced GI- soft, NT, ND, + BS Extremities- no clubbing,  cyanosis, or edema MS- no significant deformity or atrophy Skin- no rash or lesion Psych- euthymic mood, full affect Neuro- strength and sensation are intact  EKG-SR at 67 bpm, Qrs int 76 ms, qtc 437 ms TEE-Impressions:  - Successful cardioversion. No cardiac source of emboli was   indentified.   Assessment and Plan: 1. SVT/afib/flutter Successful TEE guided cardioversion Continue diltiazem  240 mg daily Triggers discussed   2. CHA2DS2VASc score of at least 4 Continue xarelto 20 mg for life as pt had prior TIA  F/u afib clinic in 3 months  Lupita Leash [facility] [address] [place], [place] [address] [phone]

## 2018-05-26 IMAGING — CR DG CHEST 2V
2 series · 2 of 2 positions shown · non-contrast
Comparison: Radiograph March 24, 2016.

CLINICAL DATA: Status post chest tube placement.

EXAM:
CHEST  2 VIEW

[chest pa]
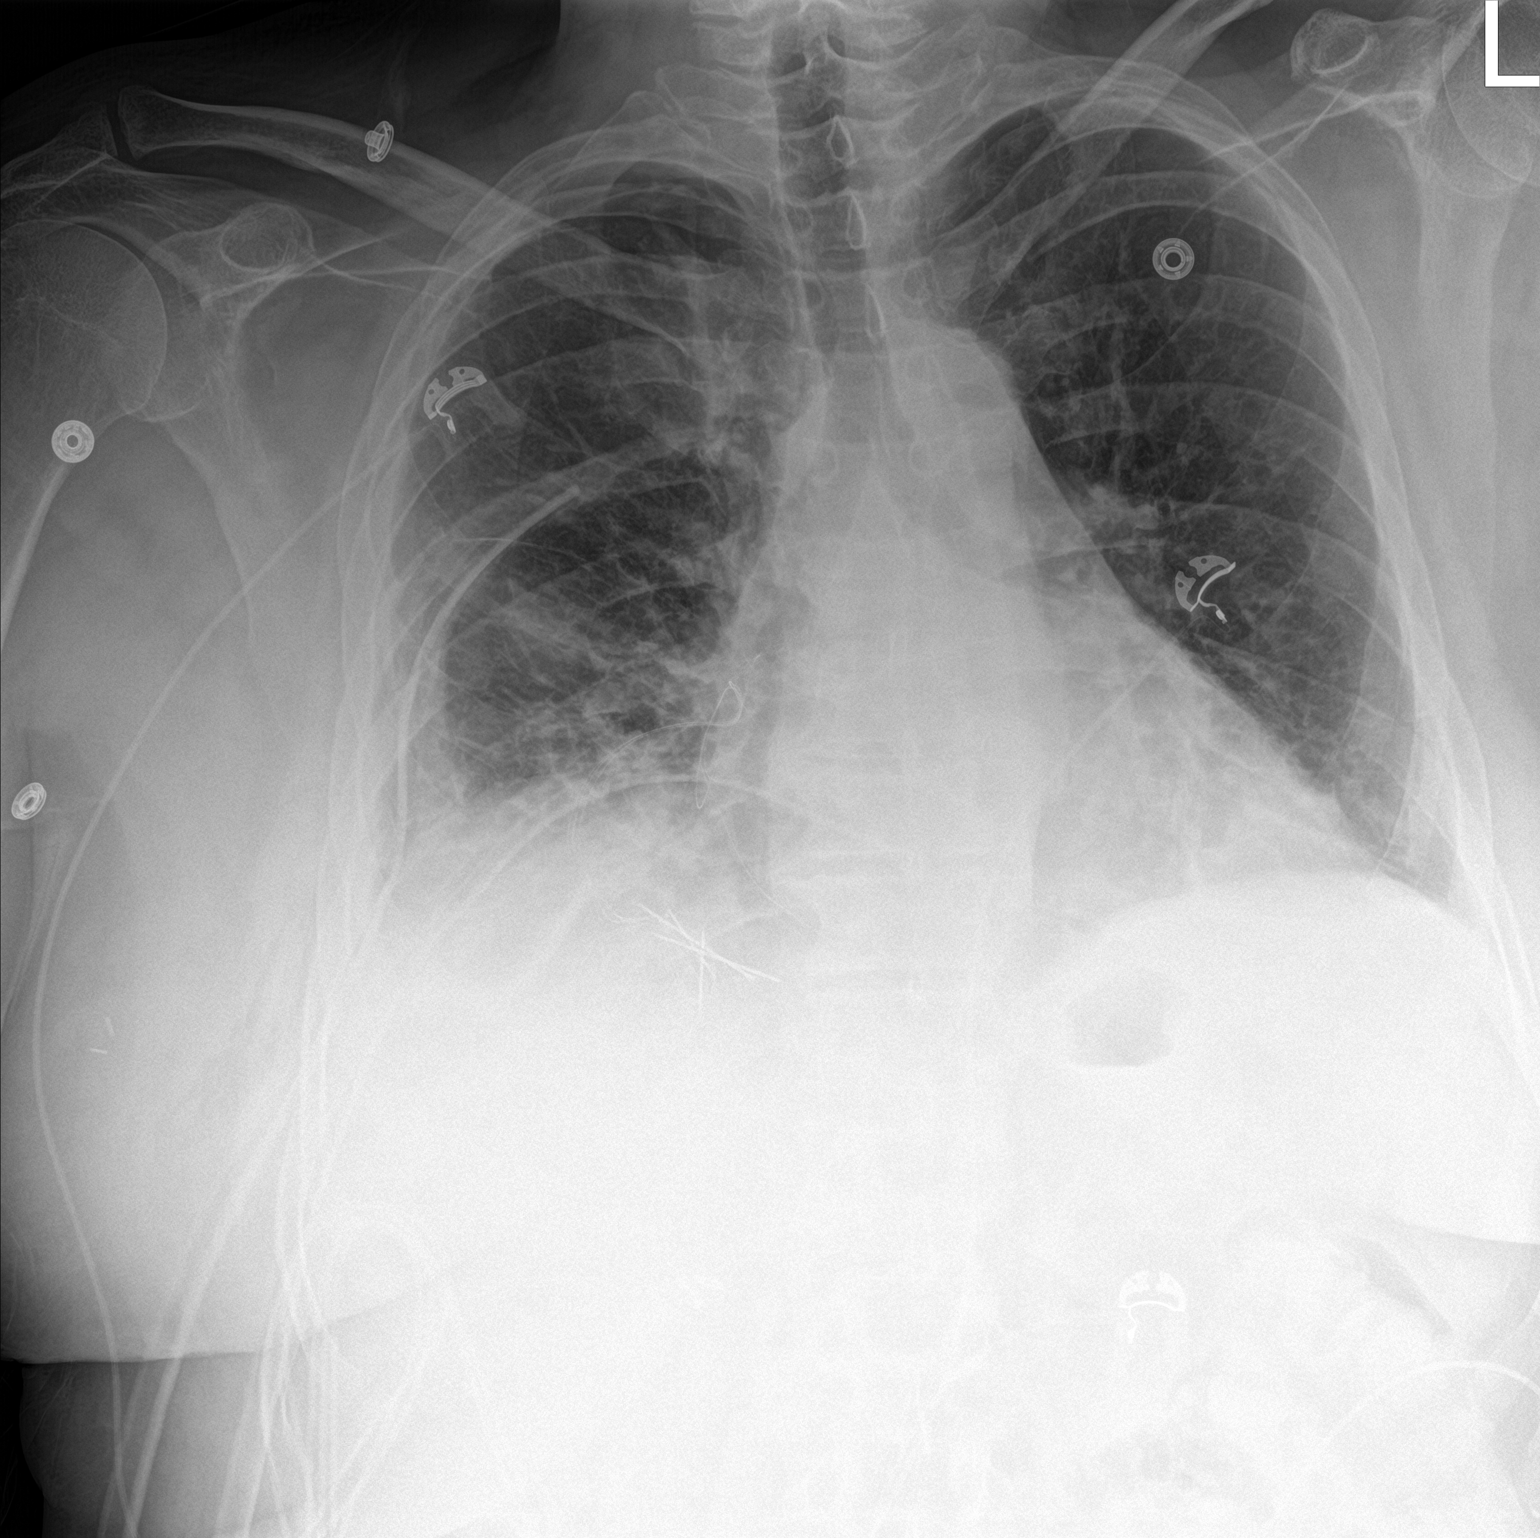

[chest lat]
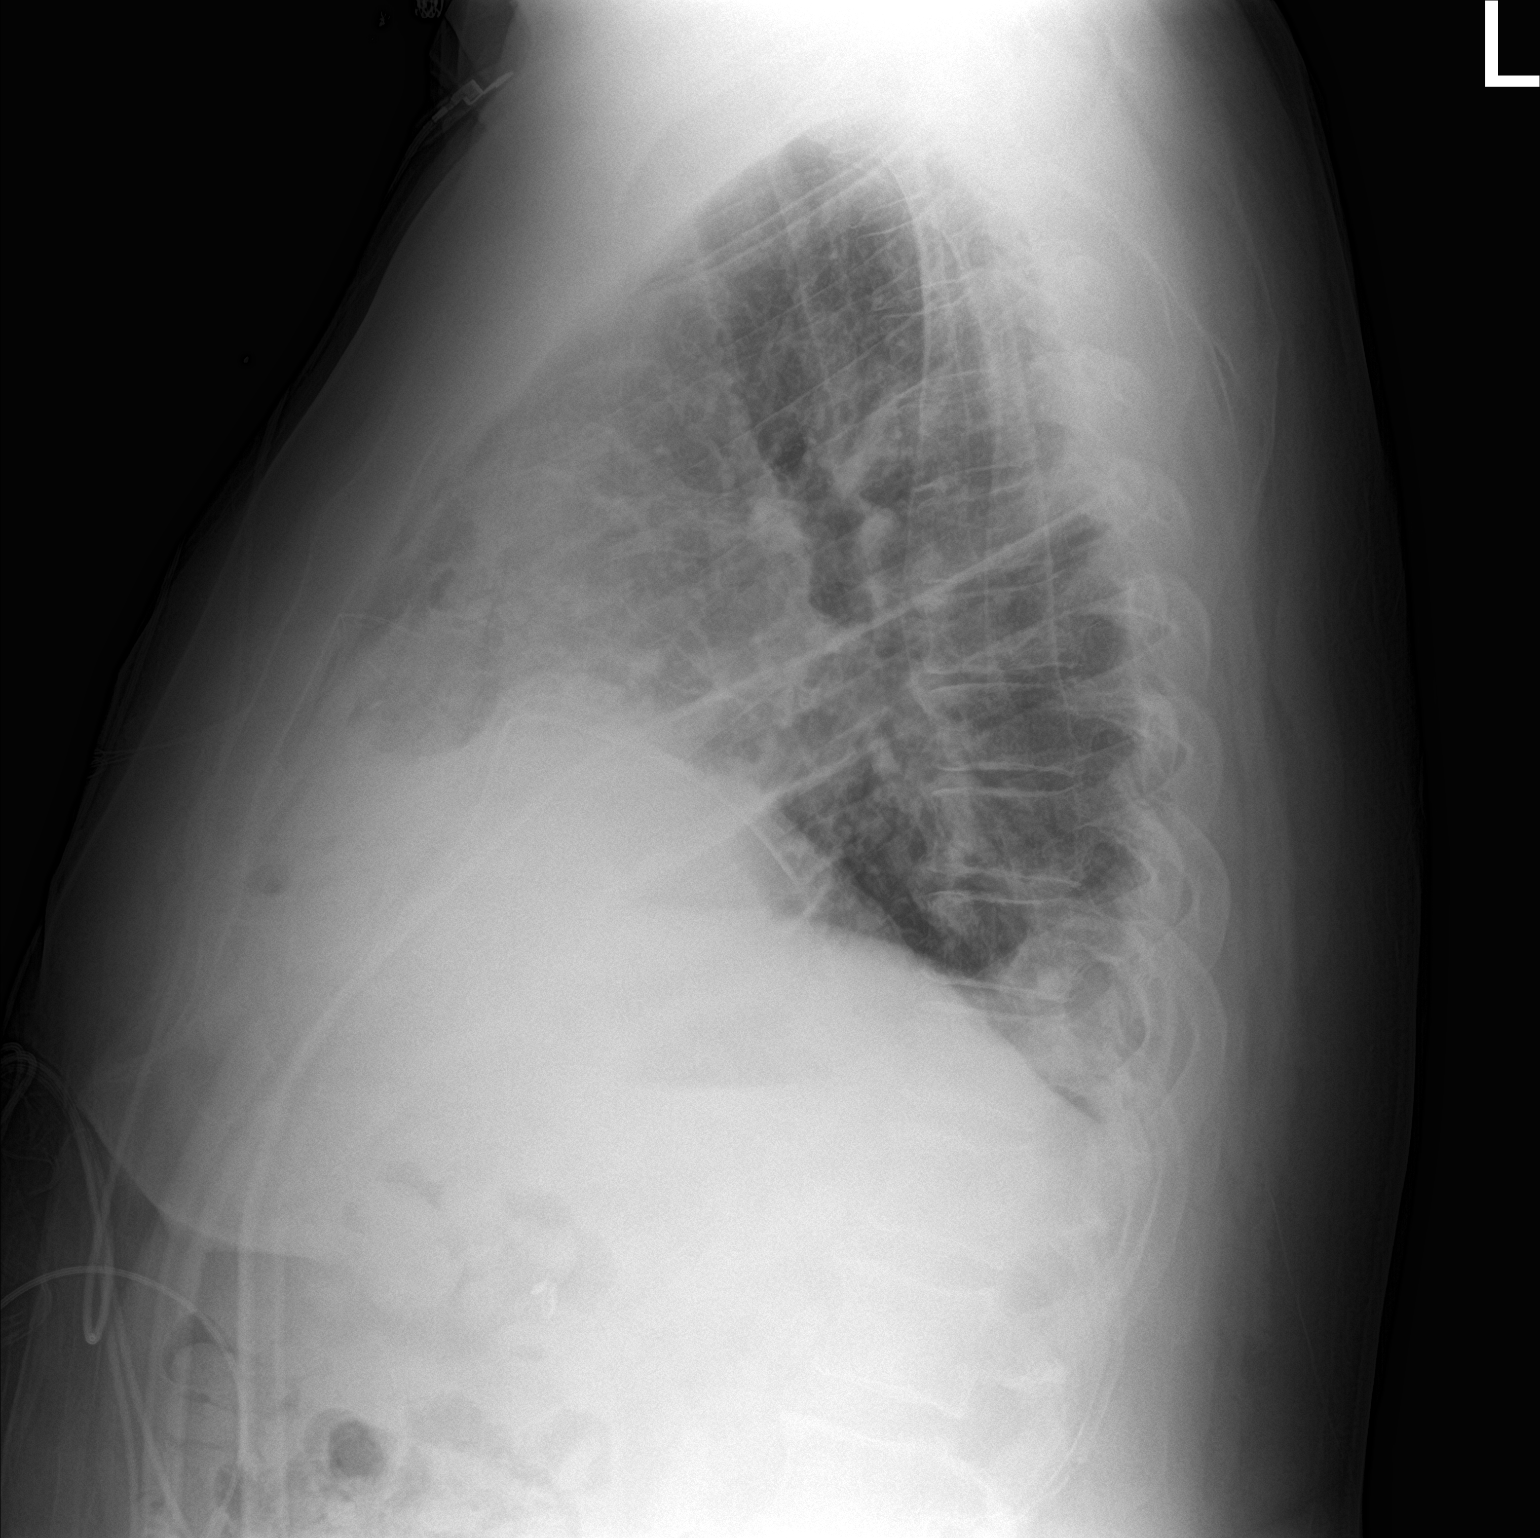

[2 of 2 positions shown; findings below may reference images not displayed]

FINDINGS: Stable cardiomediastinal silhouette. Stable mild right apical
pneumothorax is noted compared to prior exam. Two right-sided chest
tubes are again noted and unchanged in position. Minimal left
basilar subsegmental atelectasis is noted. Stable right lower lobe
opacity is noted concerning for atelectasis with probable associated
pleural effusion. Bony thorax is unremarkable.
IMPRESSION: Stable position of 2 right-sided chest tubes with stable mild right
apical pneumothorax.

## 2018-06-01 ENCOUNTER — Encounter: Payer: Self-pay | Admitting: Family Medicine

## 2018-06-01 ENCOUNTER — Ambulatory Visit (HOSPITAL_BASED_OUTPATIENT_CLINIC_OR_DEPARTMENT_OTHER)
Admission: RE | Admit: 2018-06-01 | Discharge: 2018-06-01 | Disposition: A | Discharge status: Home / Self Care | Payer: BLUE CROSS/BLUE SHIELD | Source: Ambulatory Visit | Attending: Family Medicine | Admitting: Family Medicine

## 2018-06-01 ENCOUNTER — Ambulatory Visit (INDEPENDENT_AMBULATORY_CARE_PROVIDER_SITE_OTHER): Payer: BLUE CROSS/BLUE SHIELD | Admitting: Family Medicine

## 2018-06-01 VITALS — BP 146/88 | HR 76 | Ht 74.0 in | Wt 308.0 lb

## 2018-06-01 DIAGNOSIS — G8929 Other chronic pain: Secondary | ICD-10-CM | POA: Insufficient documentation

## 2018-06-01 DIAGNOSIS — M25562 Pain in left knee: Secondary | ICD-10-CM | POA: Insufficient documentation

## 2018-06-01 DIAGNOSIS — M79672 Pain in left foot: Secondary | ICD-10-CM

## 2018-06-01 DIAGNOSIS — M25561 Pain in right knee: Secondary | ICD-10-CM

## 2018-06-01 DIAGNOSIS — M79642 Pain in left hand: Secondary | ICD-10-CM

## 2018-06-01 DIAGNOSIS — M79641 Pain in right hand: Secondary | ICD-10-CM

## 2018-06-01 DIAGNOSIS — M79674 Pain in right toe(s): Secondary | ICD-10-CM

## 2018-06-01 NOTE — Patient Instructions (Signed)
Your pain is due to arthritis at the base of your thumbs, right great toe, and the patellofemoral joint of your knees. Get x-rays of your knees and your left foot as you leave today - we will contact you with the results and next steps. These are the different medications you can take for this: Voltaren gel up to 4 times a day. Capsaicin, aspercreme, or biofreeze topically up to four times a day may also help with pain. Some supplements that may help for arthritis: Boswellia extract, curcumin, pycnogenol Avoid aleve and ibuprofen given your heart issues;  Avoid tylenol unless a GI physician tells you it's ok to take this given your cirrhosis. Steroid injections are usually an option but would increase your risk of going back into an abnormal heart rhythm. We can look into the gel injections for you given this is one of the few options available for arthritis. It's important that you continue to stay active. Straight leg raises, knee extensions 3 sets of 10 once a day (add ankle weight if these become too easy). Consider physical therapy to strengthen muscles around the joint that hurts to take pressure off of the joint itself. Thumb spica wrist braces can help with the pain at the base of your thumbs. Shoe inserts with good arch support may be helpful. Heat or ice 15 minutes at a time 3-4 times a day as needed to help with pain. Water aerobics and cycling with low resistance are the best two types of exercise for arthritis though any exercise is ok as long as it doesn't worsen the pain. Follow up will depend on the x-ray results.

## 2018-06-01 NOTE — Progress Notes (Signed)
PCP: No primary care provider on file.  Subjective:   HPI: Patient is a 57 y.o. male here for multiple complaints:  1. Bilateral knee pain-patient reports bilateral knee pain for several years.  Reports left is worse than right.  Pain ranges from 0-8/10.  His pain is made worse with knee movement, kneeling, or squatting.  He localizes pain to the anterior knee.  This is relieved with rest.  He uses oxycodone which does help.  He has tried braces and knee pads previously without benefit.  He does report occasional swelling in the left knee.  No erythema or bruising. He does have a history of injury to the right knee for which he underwent surgery he believes around 2016.  MRI results from October 2018 from [facility] show medial lateral meniscal injury, severe patellofemoral arthritis.  Is unclear whether this is related to the surgery that he had previously.  2. Right foot pain.  Patient reports a few years of pain at th base of his great toe.  Pain is worse with forced extension of the toe.  He notes occasional swelling but denies any erythema or severe tenderness.  He localizes pain to the first MTP.  He denies any specific injury to the foot.  No numbness or tingling.  No associated skin changes  3. Left foot pain-patient reports lateral left foot pain.  He localizes pain to the area of the base of the fourth metatarsal and cuboid.  He notes this pain is been present also for a few years.  He reports remote history of possible fractures in one or more bones of his foot.  This pain is worse with walking and resisted flexion. No swelling or erythema. No numbness or tingling.  No associated skin changes  4. Bilateral hand pain-patient reports pain at the first [facility] bilaterally.  This pain is also been present for greater than a year and has been worsening.  He notes his right seems to be worse than the left.  He denies any significant swelling.  No erythema or bruising.  No numbness or tingling.  His pain  is worse with increased use of his hands.  He does get some relief with Voltaren gel which also benefits the aches and pains in his other joints.  Past Medical History:  Diagnosis Date  . CHF (congestive heart failure) (HCC)   . Chronic combined systolic and diastolic CHF (congestive heart failure) (HCC)   . Cirrhosis (HCC)    liver scarring on biopsy, presumed to be due to methotrexate  . Depression   . DJD (degenerative joint disease)   . Essential hypertension 08/30/2013  . GERD (gastroesophageal reflux disease)    not present  . Gout   . Hives   . Hypercholesteremia   . Hypertension   . Mitral regurgitation - type I dysfunction - mild to moderate   . Obesity   . OSA (obstructive sleep apnea)    mild, uses CPAP  . Paroxysmal atrial fibrillation (HCC)   . Persistent atrial fibrillation 03/02/2012  . Psoriasis   . S/P Minimally invasive maze operation for atrial fibrillation 03/20/2016   Complete bilateral atrial lesion set using cryothermy and bipolar radiofrequency ablation via right mini thoracotomy approach with oversewing of LA appendage  . Shortness of breath dyspnea    exertion  . Syncope 02/21/2014  . Thrombocytopenia (HCC)   . TIA (transient ischemic attack)     Current Outpatient Medications on File Prior to Visit  Medication Sig Dispense Refill  .  carvedilol (COREG) 3.125 MG tablet Take 1 tablet (3.125 mg) as need when BP remain above 140/90.    Marland Kitchen diclofenac sodium (VOLTAREN) 1 % GEL Apply 4 g topically 4 (four) times daily.    Marland Kitchen diltiazem (CARDIZEM CD) 240 MG 24 hr capsule Take 1 capsule (240 mg total) by mouth daily. 30 capsule 0  . ergocalciferol (VITAMIN D2) 1.25 MG (50000 UT) capsule Take 50,000 Units by mouth once a week.    . halobetasol (ULTRAVATE) 0.05 % cream Apply 0.5 g topically 2 (two) times daily.    Marland Kitchen lisinopril (PRINIVIL,ZESTRIL) 5 MG tablet Take 5 mg by mouth daily.    . milk thistle 175 MG tablet Take 175 mg by mouth daily.    Marland Kitchen oxyCODONE (ROXICODONE)  15 MG immediate release tablet Take 15 mg by mouth daily as needed for pain. Take 1 tablet daily as needed    . oxymetazoline (AFRIN) 0.05 % nasal spray Place 2 sprays into both nostrils 2 (two) times daily as needed for congestion.    . rivaroxaban (XARELTO) 20 MG TABS tablet Take 1 tablet (20 mg total) by mouth daily with supper. 30 tablet 1  . sertraline (ZOLOFT) 100 MG tablet Take 150 mg by mouth every morning.     . simvastatin (ZOCOR) 40 MG tablet Take 40 mg by mouth daily.    Marland Kitchen zolpidem (AMBIEN) 10 MG tablet Take 10 mg by mouth at bedtime as needed for sleep.     No current facility-administered medications on file prior to visit.     Past Surgical History:  Procedure Laterality Date  . ABLATION OF DYSRHYTHMIC FOCUS  09/17/2012  . ATRIAL FIBRILLATION ABLATION  03/17/12   PVI and CTI ablation by Dr [name]  . ATRIAL FIBRILLATION ABLATION N/A 03/17/2012   Procedure: ATRIAL FIBRILLATION ABLATION;  Surgeon: Hillis Range, MD;  Location: [facility] CATH LAB;  Service: Cardiovascular;  Laterality: N/A;  . ATRIAL FIBRILLATION ABLATION N/A 09/18/2012   Procedure: ATRIAL FIBRILLATION ABLATION;  Surgeon: Hillis Range, MD;  Location: [facility] CATH LAB;  Service: Cardiovascular;  Laterality: N/A;  . CARDIAC CATHETERIZATION N/A 01/25/2016   Procedure: Right/Left Heart Cath and Coronary Angiography;  Surgeon: Laurey Morale, MD;  Location: [facility] INVASIVE CV LAB;  Service: Cardiovascular;  Laterality: N/A;  . CARDIOVERSION N/A 10/09/2012   Procedure: CARDIOVERSION;  Surgeon: Laurey Morale, MD;  Location: [facility] ENDOSCOPY;  Service: Cardiovascular;  Laterality: N/A;  . CARDIOVERSION N/A 02/22/2014   Procedure: CARDIOVERSION;  Surgeon: Quintella Reichert, MD;  Location: [facility] ENDOSCOPY;  Service: Cardiovascular;  Laterality: N/A;  . CARDIOVERSION N/A 12/29/2015   Procedure: CARDIOVERSION;  Surgeon: Hillis Range, MD;  Location: [facility] OR;  Service: Cardiovascular;  Laterality: N/A;  . CARDIOVERSION N/A 05/15/2018   Procedure:  CARDIOVERSION;  Surgeon: [name], MD;  Location: [facility] ENDOSCOPY;  Service: Cardiovascular;  Laterality: N/A;  . CHOLECYSTECTOMY  2011  . ELBOW ARTHROSCOPY Left 08/2014  . ELECTROPHYSIOLOGIC STUDY N/A 12/29/2015   Procedure: Cardioversion;  Surgeon: Hillis Range, MD;  Location: [facility] INVASIVE CV LAB;  Service: Cardiovascular;  Laterality: N/A;  . EYE SURGERY Bilateral 04/2015   cataract surgery  . KNEE ARTHROSCOPY Right   . MINIMALLY INVASIVE MAZE PROCEDURE N/A 03/20/2016   Procedure: MINIMALLY INVASIVE MAZE PROCEDURE;  Surgeon: Purcell Nails, MD;  Location: [facility] OR;  Service: Open Heart Surgery;  Laterality: N/A;  . NOSE SURGERY     Turbinates  . RECONSTRUCTION OF NOSE    . TEE WITHOUT CARDIOVERSION  03/16/2012  Procedure: TRANSESOPHAGEAL ECHOCARDIOGRAM (TEE);  Surgeon: [name], MD;  Location: [facility] ENDOSCOPY;  Service: Cardiovascular;  Laterality: N/A;  . TEE WITHOUT CARDIOVERSION N/A 09/17/2012   Procedure: TRANSESOPHAGEAL ECHOCARDIOGRAM (TEE);  Surgeon: Lewayne Bunting, MD;  Location: [facility] ENDOSCOPY;  Service: Cardiovascular;  Laterality: N/A;  . TEE WITHOUT CARDIOVERSION N/A 01/25/2016   Procedure: TRANSESOPHAGEAL ECHOCARDIOGRAM (TEE);  Surgeon: Laurey Morale, MD;  Location: [facility] ENDOSCOPY;  Service: Cardiovascular;  Laterality: N/A;  . TEE WITHOUT CARDIOVERSION N/A 03/20/2016   Procedure: TRANSESOPHAGEAL ECHOCARDIOGRAM (TEE);  Surgeon: Purcell Nails, MD;  Location: [facility] OR;  Service: Open Heart Surgery;  Laterality: N/A;  . TEE WITHOUT CARDIOVERSION N/A 05/15/2018   Procedure: TRANSESOPHAGEAL ECHOCARDIOGRAM (TEE);  Surgeon: [name], MD;  Location: [facility] ENDOSCOPY;  Service: Cardiovascular;  Laterality: N/A;  . TENDON REPAIR     Right Forearm  . TENDON REPAIR Right 1990's   forearm    Allergies  Allergen Reactions  . Penicillins Rash    Has patient had a PCN reaction causing immediate rash, facial/tongue/throat swelling, SOB or lightheadedness with hypotension: Yes Has patient  had a PCN reaction causing severe rash involving mucus membranes or skin necrosis: No Has patient had a PCN reaction that required hospitalization No Has patient had a PCN reaction occurring within the last 10 years: No If all of the above answers are "NO", then may proceed with Cephalosporin use.     Social History   Socioeconomic History  . Marital status: Married    Spouse name: Not on file  . Number of children: Not on file  . Years of education: Not on file  . Highest education level: Not on file  Occupational History  . Not on file  Social Needs  . Financial resource strain: Not on file  . Food insecurity:    Worry: Not on file    Inability: Not on file  . Transportation needs:    Medical: Not on file    Non-medical: Not on file  Tobacco Use  . Smoking status: Never Smoker  . Smokeless tobacco: Never Used  Substance and Sexual Activity  . Alcohol use: No  . Drug use: No  . Sexual activity: Not on file  Lifestyle  . Physical activity:    Days per week: Not on file    Minutes per session: Not on file  . Stress: Not on file  Relationships  . Social connections:    Talks on phone: Not on file    Gets together: Not on file    Attends religious service: Not on file    Active member of club or organization: Not on file    Attends meetings of clubs or organizations: Not on file    Relationship status: Not on file  . Intimate partner violence:    Fear of current or ex partner: Not on file    Emotionally abused: Not on file    Physically abused: Not on file    Forced sexual activity: Not on file  Other Topics Concern  . Not on file  Social History Narrative   Lives in [place] with spouse.  3 children are healthy.     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    Family History  Problem Relation Age of Onset  . Heart attack Father   . Heart disease Unknown   . Diabetes Unknown     BP (!) 146/88   Pulse 76   Ht 6\' 2"  (1.88 m)  Wt (!) 308  lb (139.7 kg)   BMI 39.54 kg/m   Review of Systems: See HPI above.     Objective:  Physical Exam:  Gen: awake, alert, NAD, comfortable in exam room Pulm: breathing unlabored  Right knee: - Inspection: no gross deformity.  Trace effusion, no erythema or bruising. Skin intact - Palpation: Minimal tenderness over the medial and lateral joint line.  More tenderness over the medial and lateral patellofemoral joint - ROM: full active ROM with flexion and extension in knee - Strength: 5/5 strength - Neuro/vasc: NV intact - Special Tests: - LIGAMENTS: negative anterior and posterior drawer, negative Lachman's, no MCL or LCL laxity  -- MENISCUS: negative McMurray's pain -- PF JOINT: nml patellar mobility without apprehension  Left knee: - Inspection: no gross deformity.  Trace effusion, no erythema or bruising. Skin intact - Palpation: Minimal tenderness over the medial and lateral joint line.  More tenderness over the medial and lateral patellofemoral joint - ROM: full active ROM with flexion and extension in knee - Strength: 5/5 strength - Neuro/vasc: NV intact - Special Tests: - LIGAMENTS: negative anterior and posterior drawer, negative Lachman's, no MCL or LCL laxity  -- MENISCUS: negative McMurray's pain -- PF JOINT: nml patellar mobility without apprehension  Right foot:  No obvious deformity or swelling Tenderness to palpation over the first MTP Patient has 0 degrees of extension at the first CMP.  Reduced flexion as well. 5/5 strength with EHL and ankle dorsiflexion and plantarflexion N/V intact distally  Left Foot:  No obvious deformity swelling No tenderness to palpation over the lateral aspect of the foot around the base of the fourth metatarsal and cuboid Full range of motion of the ankle 5/5 strength with resisted inversion/eversion/plantar flexion/dorsiflexion N/V intact distally  Right Hand: Inspection: No obvious deformity. No swelling, erythema or  bruising Palpation: TTP at 1st CMC ROM: Full ROM of the digits and wrist Strength: 5/5 strength in the forearm, wrist and interosseus muscles Neurovascular: NV intact Special tests: Negative finkelstein's, negative tinel's at the carpal tunnel  Left Hand: Inspection: No obvious deformity. No swelling, erythema or bruising Palpation: TTP at 1st CMC ROM: Full ROM of the digits and wrist Strength: 5/5 strength in the forearm, wrist and interosseus muscles Neurovascular: NV intact Special tests: Negative finkelstein's, negative tinel's at the carpal tunnel   Assessment & Plan:  1.  Bilateral knee pain secondary to osteoarthritis-patient has previous established severe patellofemoral osteoarthritis based on MRI from 2018.  Based on hx and exam suspect bilateral PF OA and likely some degree of medial and lateral compartment OA as well -X-rays of bilateral knees to assess severity of arthritis - For his knee OA as well as his arthritis in other joints, we are limited on possible medications for treatment options--must avoid Tylenol due to reported cirrhosis.  Avoid NSAIDs due to cardiac history.  Patient had reported recent reaction of SVT related to treatment with an intramuscular steroid injection. - Voltaren gel -Quadricep strengthening - Follow-up pending x-ray results  2.  Bilateral first CMC arthritis- patient pain consistent with arthritis. -Recommend thumb spica braces however patient declined these today -Voltaren gel  3.  Left foot pain-patient left foot pain is not well explained by normal presentation arthritis and is not consistent with tendinitis on exam.  Will obtain x-rays today to also evaluate this. -Follow-up pending  4.  Left foot pain secondary right MTP OA - pain not c/w gout flare.  - voltaren gel - recommended stiff sole  inserts  A total of 45 minutes was spent with the patient.  Greater than 50% was spent on counseling, discussing treatment options and patient  instructions.

## 2018-06-02 ENCOUNTER — Encounter: Payer: Self-pay | Admitting: Family Medicine

## 2018-06-02 MED ORDER — DICLOFENAC SODIUM 1 % TD GEL: 4.0000 g | Freq: Four times a day (QID) | TRANSDERMAL | 2 refills | Status: DC

## 2018-06-02 NOTE — Addendum Note (Signed)
Addended by: Lenda Kelp on: 06/02/2018 02:06 PM   Modules accepted: Orders

## 2020-02-15 ENCOUNTER — Observation Stay (HOSPITAL_COMMUNITY)
Admission: EM | Admit: 2020-02-15 | Discharge: 2020-02-16 | Disposition: A | Discharge status: Home / Self Care | Payer: 59 | Attending: Cardiovascular Disease | Admitting: Cardiovascular Disease

## 2020-02-15 ENCOUNTER — Emergency Department (HOSPITAL_COMMUNITY): Payer: 59

## 2020-02-15 ENCOUNTER — Encounter (HOSPITAL_COMMUNITY): Payer: Self-pay | Admitting: Emergency Medicine

## 2020-02-15 ENCOUNTER — Other Ambulatory Visit: Payer: Self-pay

## 2020-02-15 DIAGNOSIS — I5042 Chronic combined systolic (congestive) and diastolic (congestive) heart failure: Secondary | ICD-10-CM | POA: Diagnosis not present

## 2020-02-15 DIAGNOSIS — I2510 Atherosclerotic heart disease of native coronary artery without angina pectoris: Secondary | ICD-10-CM | POA: Diagnosis not present

## 2020-02-15 DIAGNOSIS — I10 Essential (primary) hypertension: Secondary | ICD-10-CM

## 2020-02-15 DIAGNOSIS — I484 Atypical atrial flutter: Secondary | ICD-10-CM | POA: Diagnosis not present

## 2020-02-15 DIAGNOSIS — I483 Typical atrial flutter: Principal | ICD-10-CM | POA: Insufficient documentation

## 2020-02-15 DIAGNOSIS — G4733 Obstructive sleep apnea (adult) (pediatric): Secondary | ICD-10-CM | POA: Insufficient documentation

## 2020-02-15 DIAGNOSIS — E7800 Pure hypercholesterolemia, unspecified: Secondary | ICD-10-CM | POA: Diagnosis present

## 2020-02-15 DIAGNOSIS — Z79899 Other long term (current) drug therapy: Secondary | ICD-10-CM | POA: Insufficient documentation

## 2020-02-15 DIAGNOSIS — I509 Heart failure, unspecified: Secondary | ICD-10-CM

## 2020-02-15 DIAGNOSIS — R000 Tachycardia, unspecified: Secondary | ICD-10-CM

## 2020-02-15 DIAGNOSIS — Z20822 Contact with and (suspected) exposure to covid-19: Secondary | ICD-10-CM | POA: Diagnosis not present

## 2020-02-15 DIAGNOSIS — I110 Hypertensive heart disease with heart failure: Secondary | ICD-10-CM | POA: Insufficient documentation

## 2020-02-15 DIAGNOSIS — Z8679 Personal history of other diseases of the circulatory system: Secondary | ICD-10-CM

## 2020-02-15 DIAGNOSIS — Z8673 Personal history of transient ischemic attack (TIA), and cerebral infarction without residual deficits: Secondary | ICD-10-CM

## 2020-02-15 DIAGNOSIS — I471 Supraventricular tachycardia: Secondary | ICD-10-CM

## 2020-02-15 DIAGNOSIS — I428 Other cardiomyopathies: Secondary | ICD-10-CM

## 2020-02-15 DIAGNOSIS — I4819 Other persistent atrial fibrillation: Secondary | ICD-10-CM

## 2020-02-15 DIAGNOSIS — I4892 Unspecified atrial flutter: Secondary | ICD-10-CM | POA: Diagnosis present

## 2020-02-15 DIAGNOSIS — R001 Bradycardia, unspecified: Secondary | ICD-10-CM

## 2020-02-15 DIAGNOSIS — R002 Palpitations: Secondary | ICD-10-CM | POA: Diagnosis present

## 2020-02-15 LAB — SARS CORONAVIRUS 2 BY RT PCR (HOSPITAL ORDER, PERFORMED IN ~~LOC~~ HOSPITAL LAB): SARS Coronavirus 2: NEGATIVE

## 2020-02-15 LAB — CBC
HCT: 45.3 % (ref 39.0–52.0)
Hemoglobin: 15 g/dL (ref 13.0–17.0)
MCH: 30.9 pg (ref 26.0–34.0)
MCHC: 33.1 g/dL (ref 30.0–36.0)
MCV: 93.4 fL (ref 80.0–100.0)
Platelets: 196 10*3/uL (ref 150–400)
RBC: 4.85 MIL/uL (ref 4.22–5.81)
RDW: 12.4 % (ref 11.5–15.5)
WBC: 8.7 10*3/uL (ref 4.0–10.5)
nRBC: 0 % (ref 0.0–0.2)

## 2020-02-15 LAB — TROPONIN I (HIGH SENSITIVITY)
Troponin I (High Sensitivity): 7 ng/L (ref <18)
Troponin I (High Sensitivity): 40 ng/L — ABNORMAL HIGH (ref <18)

## 2020-02-15 LAB — BASIC METABOLIC PANEL
Anion gap: 10 (ref 5–15)
BUN: 10 mg/dL (ref 6–20)
CO2: 22 mmol/L (ref 22–32)
Calcium: 9.2 mg/dL (ref 8.9–10.3)
Chloride: 109 mmol/L (ref 98–111)
Creatinine, Ser: 1.16 mg/dL (ref 0.61–1.24)
GFR calc Af Amer: >60 mL/min (ref >60)
GFR calc non Af Amer: >60 mL/min (ref >60)
Glucose, Bld: 130 mg/dL — ABNORMAL HIGH (ref 70–99)
Potassium: 3.7 mmol/L (ref 3.5–5.1)
Sodium: 141 mmol/L (ref 135–145)

## 2020-02-15 MED ORDER — DILTIAZEM HCL-DEXTROSE 125-5 MG/125ML-% IV SOLN (PREMIX)
5.0000 mg/h | INTRAVENOUS | Status: DC
  Administered 2020-02-15: 5 mg/h via INTRAVENOUS
  Filled 2020-02-15 (×2): qty 125

## 2020-02-15 MED ORDER — SERTRALINE HCL 50 MG PO TABS
150.0000 mg | ORAL_TABLET | Freq: Every day | ORAL | Status: DC
  Filled 2020-02-15: qty 1

## 2020-02-15 MED ORDER — ONDANSETRON HCL 4 MG/2ML IJ SOLN
INTRAMUSCULAR | Status: AC
  Filled 2020-02-15: qty 2

## 2020-02-15 MED ORDER — ONDANSETRON HCL 4 MG/2ML IJ SOLN: 4.0000 mg | Freq: Once | INTRAMUSCULAR | Status: AC

## 2020-02-15 MED ORDER — ADENOSINE 6 MG/2ML IV SOLN
INTRAVENOUS | Status: AC
  Filled 2020-02-15: qty 2

## 2020-02-15 MED ORDER — SODIUM CHLORIDE 0.9 % IV BOLUS
500.0000 mL | Freq: Once | INTRAVENOUS | Status: AC
  Administered 2020-02-15: 500 mL via INTRAVENOUS

## 2020-02-15 MED ORDER — ONDANSETRON HCL 4 MG/2ML IJ SOLN
INTRAMUSCULAR | Status: AC
  Administered 2020-02-15: 4 mg via INTRAVENOUS
  Filled 2020-02-15: qty 2

## 2020-02-15 MED ORDER — ZOLPIDEM TARTRATE 5 MG PO TABS
5.0000 mg | ORAL_TABLET | Freq: Every evening | ORAL | Status: DC | PRN
  Administered 2020-02-15: 5 mg via ORAL
  Filled 2020-02-15: qty 1

## 2020-02-15 MED ORDER — RIVAROXABAN 20 MG PO TABS
20.0000 mg | ORAL_TABLET | Freq: Every day | ORAL | Status: DC
  Filled 2020-02-15: qty 1

## 2020-02-15 MED ORDER — ONDANSETRON HCL 4 MG/2ML IJ SOLN: 4.0000 mg | Freq: Four times a day (QID) | INTRAMUSCULAR | Status: DC | PRN

## 2020-02-15 MED ORDER — ACETAMINOPHEN 325 MG PO TABS
650.0000 mg | ORAL_TABLET | ORAL | Status: DC | PRN
  Administered 2020-02-16: 650 mg via ORAL
  Filled 2020-02-15: qty 2

## 2020-02-15 MED ORDER — PROPOFOL 1000 MG/100ML IV EMUL
INTRAVENOUS | Status: AC
  Filled 2020-02-15: qty 100

## 2020-02-15 MED ORDER — SIMVASTATIN 20 MG PO TABS: 40.0000 mg | ORAL_TABLET | Freq: Every day | ORAL | Status: DC

## 2020-02-15 MED ORDER — ADENOSINE 6 MG/2ML IV SOLN
12.0000 mg | Freq: Once | INTRAVENOUS | Status: AC
  Administered 2020-02-15: 12 mg via INTRAVENOUS

## 2020-02-15 NOTE — ED Notes (Signed)
Patient presents with rapid heart rate. Reports he has history of afib. HR noted to be 200's in triage. Taken immediately to treatment room

## 2020-02-15 NOTE — ED Notes (Signed)
Pt placed on 2l [place].

## 2020-02-15 NOTE — ED Provider Notes (Signed)
[facility] EMERGENCY DEPARTMENT Provider Note   CSN: [id number] Arrival date & time: 02/15/20  1902     History Chief Complaint  Patient presents with  . Tachycardia    [facility] is a 59 y.o. male.  59 year old male with prior medical history detailed below presents for evaluation of tachycardia.  Patient reports onset of tachycardia around noon today.  He feels mildly short of breath.  He also complains of nausea.  Denies chest pain.  He reports significant prior history of atrial fibrillation.  He is compliant with Xarelto.  His last dose of Xarelto was early this morning.     The history is provided by the patient.  Illness Location:  Tachycardia, associated with nausea, shortness of breath Severity:  Severe Onset quality:  Gradual Duration:  8 hours Timing:  Constant Progression:  Worsening Chronicity:  New Associated symptoms: no chest pain        Past Medical History:  Diagnosis Date  . CHF (congestive heart failure) (HCC)   . Chronic combined systolic and diastolic CHF (congestive heart failure) (HCC)   . Cirrhosis (HCC)    liver scarring on biopsy, presumed to be due to methotrexate  . Depression   . DJD (degenerative joint disease)   . Essential hypertension 08/30/2013  . GERD (gastroesophageal reflux disease)    not present  . Gout   . Hives   . Hypercholesteremia   . Hypertension   . Mitral regurgitation - type I dysfunction - mild to moderate   . Obesity   . OSA (obstructive sleep apnea)    mild, uses CPAP  . Paroxysmal atrial fibrillation (HCC)   . Persistent atrial fibrillation (HCC) 03/02/2012  . Psoriasis   . S/P Minimally invasive maze operation for atrial fibrillation 03/20/2016   Complete bilateral atrial lesion set using cryothermy and bipolar radiofrequency ablation via right mini thoracotomy approach with oversewing of LA appendage  . Shortness of breath dyspnea    exertion  . Syncope 02/21/2014  .  Thrombocytopenia (HCC)   . TIA (transient ischemic attack)     Patient Active Problem List   Diagnosis Date Noted  . AKI (acute kidney injury) (HCC) 05/13/2018  . URI (upper respiratory infection) 05/13/2018  . SVT (supraventricular tachycardia) (HCC) 05/13/2018  . Hypotension 04/22/2016  . S/P Minimally invasive maze operation for atrial fibrillation 03/20/2016  . Mitral regurgitation - type I dysfunction - mild to moderate   . Abnormal PFTs (pulmonary function tests) 04/28/2015  . Chronic combined systolic and diastolic CHF (congestive heart failure) (HCC)   . Syncope 02/21/2014  . NICM (nonischemic cardiomyopathy) (HCC) 08/30/2013  . Sinus bradycardia 08/30/2013  . Fatigue 08/30/2013  . Essential hypertension 08/30/2013  . Persistent atrial fibrillation (HCC) 03/02/2012  . Hives   . Hypersomnia   . SOB (shortness of breath)   . Sleep apnea   . Hypercholesteremia   . Thrombocytopenia (HCC)   . OSA (obstructive sleep apnea)   . Obesity   . Hyperglycemia   . Anxiety   . Gout     Past Surgical History:  Procedure Laterality Date  . ABLATION OF DYSRHYTHMIC FOCUS  09/17/2012  . ATRIAL FIBRILLATION ABLATION  03/17/12   PVI and CTI ablation by Dr [name]  . ATRIAL FIBRILLATION ABLATION N/A 03/17/2012   Procedure: ATRIAL FIBRILLATION ABLATION;  Surgeon: Hillis Range, MD;  Location: [facility] C[facility] LAB;  Service: Cardiovascular;  Laterality: N/A;  . ATRIAL FIBRILLATION ABLATION N/A 09/18/2012   Procedure: ATRIAL FIBRILLATION  ABLATION;  Surgeon: Hillis Range, MD;  Location: [facility] CATH LAB;  Service: Cardiovascular;  Laterality: N/A;  . CARDIAC CATHETERIZATION N/A 01/25/2016   Procedure: Right/Left Heart Cath and Coronary Angiography;  Surgeon: Laurey Morale, MD;  Location: [facility] INVASIVE CV LAB;  Service: Cardiovascular;  Laterality: N/A;  . CARDIOVERSION N/A 10/09/2012   Procedure: CARDIOVERSION;  Surgeon: Laurey Morale, MD;  Location: [facility] ENDOSCOPY;  Service: Cardiovascular;  Laterality: N/A;    . CARDIOVERSION N/A 02/22/2014   Procedure: CARDIOVERSION;  Surgeon: Quintella Reichert, MD;  Location: [facility] ENDOSCOPY;  Service: Cardiovascular;  Laterality: N/A;  . CARDIOVERSION N/A 12/29/2015   Procedure: CARDIOVERSION;  Surgeon: Hillis Range, MD;  Location: [facility] OR;  Service: Cardiovascular;  Laterality: N/A;  . CARDIOVERSION N/A 05/15/2018   Procedure: CARDIOVERSION;  Surgeon: [name], MD;  Location: [facility] ENDOSCOPY;  Service: Cardiovascular;  Laterality: N/A;  . CHOLECYSTECTOMY  2011  . ELBOW ARTHROSCOPY Left 08/2014  . ELECTROPHYSIOLOGIC STUDY N/A 12/29/2015   Procedure: Cardioversion;  Surgeon: Hillis Range, MD;  Location: [facility] INVASIVE CV LAB;  Service: Cardiovascular;  Laterality: N/A;  . EYE SURGERY Bilateral 04/2015   cataract surgery  . KNEE ARTHROSCOPY Right   . MINIMALLY INVASIVE MAZE PROCEDURE N/A 03/20/2016   Procedure: MINIMALLY INVASIVE MAZE PROCEDURE;  Surgeon: Purcell Nails, MD;  Location: [facility] OR;  Service: Open Heart Surgery;  Laterality: N/A;  . NOSE SURGERY     Turbinates  . RECONSTRUCTION OF NOSE    . TEE WITHOUT CARDIOVERSION  03/16/2012   Procedure: TRANSESOPHAGEAL ECHOCARDIOGRAM (TEE);  Surgeon: [name], MD;  Location: [facility] ENDOSCOPY;  Service: Cardiovascular;  Laterality: N/A;  . TEE WITHOUT CARDIOVERSION N/A 09/17/2012   Procedure: TRANSESOPHAGEAL ECHOCARDIOGRAM (TEE);  Surgeon: Lewayne Bunting, MD;  Location: [facility] ENDOSCOPY;  Service: Cardiovascular;  Laterality: N/A;  . TEE WITHOUT CARDIOVERSION N/A 01/25/2016   Procedure: TRANSESOPHAGEAL ECHOCARDIOGRAM (TEE);  Surgeon: Laurey Morale, MD;  Location: [facility] ENDOSCOPY;  Service: Cardiovascular;  Laterality: N/A;  . TEE WITHOUT CARDIOVERSION N/A 03/20/2016   Procedure: TRANSESOPHAGEAL ECHOCARDIOGRAM (TEE);  Surgeon: Purcell Nails, MD;  Location: [facility] OR;  Service: Open Heart Surgery;  Laterality: N/A;  . TEE WITHOUT CARDIOVERSION N/A 05/15/2018   Procedure: TRANSESOPHAGEAL ECHOCARDIOGRAM (TEE);  Surgeon: [name],  MD;  Location: [facility] ENDOSCOPY;  Service: Cardiovascular;  Laterality: N/A;  . TENDON REPAIR     Right Forearm  . TENDON REPAIR Right 1990's   forearm       Family History  Problem Relation Age of Onset  . Heart attack Father   . Heart disease Other   . Diabetes Other     Social History   Tobacco Use  . Smoking status: Never Smoker  . Smokeless tobacco: Never Used  Vaping Use  . Vaping Use: Never used  Substance Use Topics  . Alcohol use: No  . Drug use: No    Home Medications Prior to Admission medications   Medication Sig Start Date End Date Taking? Authorizing Provider  carvedilol (COREG) 3.125 MG tablet Take 1 tablet (3.125 mg) as need when BP remain above 140/90.    [provider]  diclofenac sodium (VOLTAREN) 1 % GEL Apply 4 g topically 4 (four) times daily. 06/02/18   Hudnall, Azucena Fallen, MD  diltiazem (CARDIZEM CD) 240 MG 24 hr capsule Take 1 capsule (240 mg total) by mouth daily. 05/16/18   Zannie Cove, MD  ergocalciferol (VITAMIN D2) 1.25 MG (50000 UT) capsule Take 50,000 Units by mouth  once a week. 01/18/18   Coralee Rud, PA-C  halobetasol (ULTRAVATE) 0.05 % cream Apply 0.5 g topically 2 (two) times daily. 02/05/18   Coralee Rud, PA-C  lisinopril (PRINIVIL,ZESTRIL) 5 MG tablet Take 5 mg by mouth daily. 05/11/18   Coralee Rud, PA-C  milk thistle 175 MG tablet Take 175 mg by mouth daily.    [provider]  oxyCODONE (ROXICODONE) 15 MG immediate release tablet Take 15 mg by mouth daily as needed for pain. Take 1 tablet daily as needed 01/29/18   Coralee Rud, PA-C  rivaroxaban (XARELTO) 20 MG TABS tablet Take 1 tablet (20 mg total) by mouth daily with supper. 05/15/18   Zannie Cove, MD  sertraline (ZOLOFT) 100 MG tablet Take 150 mg by mouth every morning.     [provider]  simvastatin (ZOCOR) 40 MG tablet Take 40 mg by mouth daily. 01/04/18   Coralee Rud, PA-C  zolpidem (AMBIEN) 10 MG tablet Take 10 mg by mouth at  bedtime as needed for sleep.    [provider]    Allergies    Penicillins  Review of Systems   Review of Systems  Cardiovascular: Negative for chest pain.  All other systems reviewed and are negative.   Physical Exam Updated Vital Signs BP 97/78   Pulse (!) 114   Temp 98.4 F (36.9 C) (Oral)   Resp 14   Ht 6\' 2"  (1.88 m)   Wt 136.1 kg   SpO2 100%   BMI 38.52 kg/m   Physical Exam Vitals and nursing note reviewed.  Constitutional:      General: He is in acute distress.     Appearance: He is well-developed.  HENT:     Head: Normocephalic and atraumatic.  Eyes:     Conjunctiva/sclera: Conjunctivae normal.     Pupils: Pupils are equal, round, and reactive to light.  Cardiovascular:     Rate and Rhythm: Tachycardia present. Rhythm irregular.     Heart sounds: Normal heart sounds.  Pulmonary:     Effort: Pulmonary effort is normal. No respiratory distress.     Breath sounds: Normal breath sounds.  Abdominal:     General: There is no distension.     Palpations: Abdomen is soft.     Tenderness: There is no abdominal tenderness.  Musculoskeletal:        General: No deformity. Normal range of motion.     Cervical back: Normal range of motion and neck supple.  Skin:    General: Skin is warm and dry.  Neurological:     Mental Status: He is alert and oriented to person, place, and time.     ED Results / Procedures / Treatments   Labs (all labs ordered are listed, but only abnormal results are displayed) Labs Reviewed  BASIC METABOLIC PANEL - Abnormal; Notable for the following components:      Result Value   Glucose, Bld 130 (*)    All other components within normal limits  SARS CORONAVIRUS 2 BY RT PCR (HOSPITAL ORDER, PERFORMED IN [facility])  CBC  TROPONIN I (HIGH SENSITIVITY)    EKG EKG Interpretation  Date/Time:  Tuesday February 15 2020 20:57:07 EDT Ventricular Rate:  78 PR Interval:    QRS Duration: 125 QT Interval:  372 QTC  Calculation: 424 R Axis:   133 Text Interpretation: Uncertain rhythm: review Prolonged PR interval Consider left atrial enlargement Right bundle branch block ST elevation, consider lateral injury  Confirmed by Kristine Royal [phone]) on 02/15/2020 9:00:42 PM   Radiology DG Chest Portable 1 View  Result Date: 02/15/2020 CLINICAL DATA:  Hypotension with diaphoresis and tachycardia. EXAM: PORTABLE CHEST 1 VIEW COMPARISON:  May 14, 2018 FINDINGS: Mild areas of atelectasis and/or infiltrate are seen within the bilateral lung bases. There is no evidence of a pleural effusion or pneumothorax. The heart size and mediastinal contours are within normal limits. The visualized skeletal structures are unremarkable. IMPRESSION: Mild bibasilar atelectasis and/or infiltrate. Electronically Signed   By: Aram Candela M.D.   On: 02/15/2020 20:10    Procedures Procedures (including critical care time) CRITICAL CARE Performed by: Wynetta Fines   Total critical care time: 30 minutes  Critical care time was exclusive of separately billable procedures and treating other patients.  Critical care was necessary to treat or prevent imminent or life-threatening deterioration.  Critical care was time spent personally by me on the following activities: development of treatment plan with patient and/or surrogate as well as nursing, discussions with consultants, evaluation of patient's response to treatment, examination of patient, obtaining history from patient or surrogate, ordering and performing treatments and interventions, ordering and review of laboratory studies, ordering and review of radiographic studies, pulse oximetry and re-evaluation of patient's condition.   Medications Ordered in ED Medications  diltiazem (CARDIZEM) 125 mg in dextrose 5% 125 mL (1 mg/mL) infusion (7.5 mg/hr Intravenous Rate/Dose Change 02/15/20 2005)  ondansetron (ZOFRAN) 4 MG/2ML injection (has no administration in time range)    ondansetron (ZOFRAN) injection 4 mg (4 mg Intravenous Given 02/15/20 1955)  sodium chloride 0.9 % bolus 500 mL (0 mLs Intravenous Stopped 02/15/20 2042)  adenosine (ADENOCARD) 6 MG/2ML injection 12 mg (12 mg Intravenous Given 02/15/20 2056)    ED Course  I have reviewed the triage vital signs and the nursing notes.  Pertinent labs & imaging results that were available during my care of the patient were reviewed by me and considered in my medical decision making (see chart for details).    MDM Rules/Calculators/A&P                          MDM  Screen complete  ALMUS PUCILLO was evaluated in Emergency Department on 02/15/2020 for the symptoms described in the history of present illness. He was evaluated in the context of the global COVID-19 pandemic, which necessitated consideration that the patient might be at risk for infection with the SARS-CoV-2 virus that causes COVID-19. Institutional protocols and algorithms that pertain to the evaluation of patients at risk for COVID-19 are in a state of rapid change based on information released by regulatory bodies including the CDC and federal and state organizations. These policies and algorithms were followed during the patient's care in the ED.  Patient presented with significant tachycardia with heart rate in 200s.  Patient was initially hypotensive in the ED.  Patient was also complaining of significant nausea.  With nausea treatment the patient's blood pressure improved. Concurrently Cardizem drip was initiated for rate control.  Patient responded well with improvement in heart rate from the 200s down into the 120's range.  I suspect that his initial hypotension may have been in 1 part vasovagal reaction to the nausea and also secondary to the significant tachycardia.  Electrical cardioversion was not required given rate control with Cardizem drip alone.  Cardiology is aware of case and will evaluate for admission.  Final Clinical  Impression(s) / ED Diagnoses Final  diagnoses:  Tachycardia    Rx / DC Orders ED Discharge Orders    None       Wynetta Fines, MD 02/15/20 2108

## 2020-02-15 NOTE — H&P (Signed)
Cardiology Admission History and Physical:   Patient ID: Joseph Garrett MRN: [id number]; DOB: 05-28-1961   Admission date: 02/15/2020  Primary Care Provider: Patient, No Pcp Per Toma Copier Provider [facility] Cardiologist: Hillis Range, MD  [facility] Electrophysiologist:  Hillis Range, MD   Chief Complaint:  Heart rate in the 200s  Patient Profile:   Joseph Garrett is a 59 y.o. male with persistent atrial fibrillation CHADSVASC ~4 on Xarelto, s/p MAZE 2013 with prior LAA oversewing and w good closure per 2019 TEE; hx of AFl tolerating amiodarone, asymptomatic bradycardia, HF Recovered EF per last Echo 2019, OSA on CPAP, and morbid obesity; prior TIA per chart review; nonobstructive CAD per last cath.  History of Present Illness:   Joseph Garrett presents to the ED today with c/o palpitations and nausea.  Similar to 2019 admission sx.  Patient was found to have Atrial Flutter at that time.    Patient has got off work at noon and felt unwell.  Nausea and vomiting (once in ED).  Has felt fine since last admission before this afternoon.  Notes that he felt dehydrated and felt this was a trigger in the past.   Has taken his AX  Yesterday developed palpitations, worsened today and wife sent him in to ED  Presented in SVT with rates in the 200s, diffuse ST depressions on EKG.  This improved to 120s with IV cardizem.  Repeat EKG shows likely atrial flutter with rate 124.  No change with carotid massage or modified Valsalva maneuver.  With 12 mg adenosine, Flutter waves noted (see ~ 9 pm EKG).  Patient notes no bleeding issues and is still on Xarelto.  Denied allergies to amiodarone; was stopped in the past because of resolution of palpitations.  Denied PNd/orthopnea/bendopnea/weight gain; EF recovered with resolution of a fib in the past.  Cardiology consulted.  Denies alcohol trigger.  No issues with blood thinner and no breaks in [facility].  Past Medical History:  Diagnosis Date  . CHF  (congestive heart failure) (HCC)   . Chronic combined systolic and diastolic CHF (congestive heart failure) (HCC)   . Cirrhosis (HCC)    liver scarring on biopsy, presumed to be due to methotrexate  . Depression   . DJD (degenerative joint disease)   . Essential hypertension 08/30/2013  . GERD (gastroesophageal reflux disease)    not present  . Gout   . Hives   . Hypercholesteremia   . Hypertension   . Mitral regurgitation - type I dysfunction - mild to moderate   . Obesity   . OSA (obstructive sleep apnea)    mild, uses CPAP  . Paroxysmal atrial fibrillation (HCC)   . Persistent atrial fibrillation (HCC) 03/02/2012  . Psoriasis   . S/P Minimally invasive maze operation for atrial fibrillation 03/20/2016   Complete bilateral atrial lesion set using cryothermy and bipolar radiofrequency ablation via right mini thoracotomy approach with oversewing of LA appendage  . Shortness of breath dyspnea    exertion  . Syncope 02/21/2014  . Thrombocytopenia (HCC)   . TIA (transient ischemic attack)     Past Surgical History:  Procedure Laterality Date  . ABLATION OF DYSRHYTHMIC FOCUS  09/17/2012  . ATRIAL FIBRILLATION ABLATION  03/17/12   PVI and CTI ablation by Dr [name]  . ATRIAL FIBRILLATION ABLATION N/A 03/17/2012   Procedure: ATRIAL FIBRILLATION ABLATION;  Surgeon: Hillis Range, MD;  Location: [facility] CATH LAB;  Service: Cardiovascular;  Laterality: N/A;  . ATRIAL FIBRILLATION ABLATION N/A 09/18/2012  Procedure: ATRIAL FIBRILLATION ABLATION;  Surgeon: Hillis Range, MD;  Location: [facility] CATH LAB;  Service: Cardiovascular;  Laterality: N/A;  . CARDIAC CATHETERIZATION N/A 01/25/2016   Procedure: Right/Left Heart Cath and Coronary Angiography;  Surgeon: Laurey Morale, MD;  Location: [facility] INVASIVE CV LAB;  Service: Cardiovascular;  Laterality: N/A;  . CARDIOVERSION N/A 10/09/2012   Procedure: CARDIOVERSION;  Surgeon: Laurey Morale, MD;  Location: [facility] ENDOSCOPY;  Service: Cardiovascular;  Laterality: N/A;    . CARDIOVERSION N/A 02/22/2014   Procedure: CARDIOVERSION;  Surgeon: Quintella Reichert, MD;  Location: [facility] ENDOSCOPY;  Service: Cardiovascular;  Laterality: N/A;  . CARDIOVERSION N/A 12/29/2015   Procedure: CARDIOVERSION;  Surgeon: Hillis Range, MD;  Location: [facility] OR;  Service: Cardiovascular;  Laterality: N/A;  . CARDIOVERSION N/A 05/15/2018   Procedure: CARDIOVERSION;  Surgeon: [name], MD;  Location: [facility] ENDOSCOPY;  Service: Cardiovascular;  Laterality: N/A;  . CHOLECYSTECTOMY  2011  . ELBOW ARTHROSCOPY Left 08/2014  . ELECTROPHYSIOLOGIC STUDY N/A 12/29/2015   Procedure: Cardioversion;  Surgeon: Hillis Range, MD;  Location: [facility] INVASIVE CV LAB;  Service: Cardiovascular;  Laterality: N/A;  . EYE SURGERY Bilateral 04/2015   cataract surgery  . KNEE ARTHROSCOPY Right   . MINIMALLY INVASIVE MAZE PROCEDURE N/A 03/20/2016   Procedure: MINIMALLY INVASIVE MAZE PROCEDURE;  Surgeon: Purcell Nails, MD;  Location: [facility] OR;  Service: Open Heart Surgery;  Laterality: N/A;  . NOSE SURGERY     Turbinates  . RECONSTRUCTION OF NOSE    . TEE WITHOUT CARDIOVERSION  03/16/2012   Procedure: TRANSESOPHAGEAL ECHOCARDIOGRAM (TEE);  Surgeon: [name], MD;  Location: [facility] ENDOSCOPY;  Service: Cardiovascular;  Laterality: N/A;  . TEE WITHOUT CARDIOVERSION N/A 09/17/2012   Procedure: TRANSESOPHAGEAL ECHOCARDIOGRAM (TEE);  Surgeon: Lewayne Bunting, MD;  Location: [facility] ENDOSCOPY;  Service: Cardiovascular;  Laterality: N/A;  . TEE WITHOUT CARDIOVERSION N/A 01/25/2016   Procedure: TRANSESOPHAGEAL ECHOCARDIOGRAM (TEE);  Surgeon: Laurey Morale, MD;  Location: [facility] ENDOSCOPY;  Service: Cardiovascular;  Laterality: N/A;  . TEE WITHOUT CARDIOVERSION N/A 03/20/2016   Procedure: TRANSESOPHAGEAL ECHOCARDIOGRAM (TEE);  Surgeon: Purcell Nails, MD;  Location: [facility] OR;  Service: Open Heart Surgery;  Laterality: N/A;  . TEE WITHOUT CARDIOVERSION N/A 05/15/2018   Procedure: TRANSESOPHAGEAL ECHOCARDIOGRAM (TEE);  Surgeon: [name],  MD;  Location: [facility] ENDOSCOPY;  Service: Cardiovascular;  Laterality: N/A;  . TENDON REPAIR     Right Forearm  . TENDON REPAIR Right 1990's   forearm     Medications Prior to Admission: Prior to Admission medications   Medication Sig Start Date End Date Taking? Authorizing Provider  Acetaminophen (TYLENOL ARTHRITIS PAIN PO) Take 1 tablet by mouth daily.   Yes [provider]  carvedilol (COREG) 3.125 MG tablet Take 1 tablet (3.125 mg) as need when BP remain above 140/90.   Yes [provider]  diltiazem (CARDIZEM CD) 240 MG 24 hr capsule Take 1 capsule (240 mg total) by mouth daily. 05/16/18  Yes Zannie Cove, MD  ergocalciferol (VITAMIN D2) 1.25 MG (50000 UT) capsule Take 50,000 Units by mouth once a week. 01/18/18  Yes Coralee Rud, PA-C  halobetasol (ULTRAVATE) 0.05 % cream Apply 0.5 g topically 2 (two) times daily. 02/05/18  Yes Coralee Rud, PA-C  lisinopril (PRINIVIL,ZESTRIL) 5 MG tablet Take 5 mg by mouth daily. 05/11/18  Yes Coralee Rud, PA-C  rivaroxaban (XARELTO) 20 MG TABS tablet Take 1 tablet (20 mg total) by mouth daily with supper. 05/15/18  Yes Zannie Cove,  MD  sertraline (ZOLOFT) 100 MG tablet Take 150 mg by mouth every morning.    Yes [provider]  simvastatin (ZOCOR) 40 MG tablet Take 40 mg by mouth daily. 01/04/18  Yes Coralee Rud, PA-C  zolpidem (AMBIEN) 10 MG tablet Take 10 mg by mouth at bedtime as needed for sleep.   Yes [provider]  diclofenac sodium (VOLTAREN) 1 % GEL Apply 4 g topically 4 (four) times daily. 06/02/18   Hudnall, Azucena Fallen, MD  milk thistle 175 MG tablet Take 175 mg by mouth daily.    [provider]  oxyCODONE (ROXICODONE) 15 MG immediate release tablet Take 15 mg by mouth daily as needed for pain. Take 1 tablet daily as needed 01/29/18   Coralee Rud, PA-C     Allergies:    Allergies  Allergen Reactions  . Penicillins Rash    Has patient had a PCN reaction causing immediate  rash, facial/tongue/throat swelling, SOB or lightheadedness with hypotension: Yes Has patient had a PCN reaction causing severe rash involving mucus membranes or skin necrosis: No Has patient had a PCN reaction that required hospitalization No Has patient had a PCN reaction occurring within the last 10 years: No If all of the above answers are "NO", then may proceed with Cephalosporin use.    Social History:   Social History   Socioeconomic History  . Marital status: Married    Spouse name: Not on file  . Number of children: Not on file  . Years of education: Not on file  . Highest education level: Not on file  Occupational History  . Not on file  Tobacco Use  . Smoking status: Never Smoker  . Smokeless tobacco: Never Used  Vaping Use  . Vaping Use: Never used  Substance and Sexual Activity  . Alcohol use: No  . Drug use: No  . Sexual activity: Not on file  Other Topics Concern  . Not on file  Social History Narrative   Lives in [place] with spouse.  3 children are healthy.     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   Social Determinants of Corporate investment banker Strain:   . Difficulty of Paying Living Expenses:   Food Insecurity:   . Worried About Programme researcher, broadcasting/film/video in the Last Year:   . Barista in the Last Year:   Transportation Needs:   . Freight forwarder (Medical):   Marland Kitchen Lack of Transportation (Non-Medical):   Physical Activity:   . Days of Exercise per Week:   . Minutes of Exercise per Session:   Stress:   . Feeling of Stress :   Social Connections:   . Frequency of Communication with Friends and Family:   . Frequency of Social Gatherings with Friends and Family:   . Attends Religious Services:   . Active Member of Clubs or Organizations:   . Attends Banker Meetings:   Marland Kitchen Marital Status:   Intimate Partner Violence:   . Fear of Current or Ex-Partner:   . Emotionally Abused:   Marland Kitchen Physically Abused:   .  Sexually Abused:     Family History:  Father has atrial fibrillation The patient's family history includes Diabetes in an other family member; Heart attack in his father; Heart disease in an other family member.    ROS:  Please see the history of present illness.  All other ROS reviewed and negative.  Physical Exam/Data:   Vitals:   02/15/20 2010 02/15/20 2025 02/15/20 2035 02/15/20 2040  BP: 101/70  105/73 97/78  Pulse: (!) 138 (!) 111  (!) 114  Resp:    14  Temp:      TempSrc:      SpO2: 100% 100% 100% 100%  Weight:      Height:       No intake or output data in the 24 hours ending 02/15/20 2129 Last 3 Weights 02/15/2020 06/01/2018 05/25/2018  Weight (lbs) 300 lb 308 lb 311 lb  Weight (kg) 136.079 kg 139.708 kg 141.069 kg     Body mass index is 38.52 kg/m.  General:  Obese Male in NAD HEENT: normal, no carotid bruit bilaterally Lymph: no adenopathy Neck: no JVD Endocrine:  No thryomegaly Cardiac:  normal S1, S2; Regular tachycardia Lungs:  clear to auscultation bilaterally, no wheezing, rhonchi or rales  Abd: soft, nontender, no hepatomegaly  Ext: no edema Musculoskeletal:  No deformities, BUE and BLE strength normal and equal Skin: warm and dry  Psych:  Normal affect    EKG:  The ECG that was done I personally reviewed and demonstrates atrial flutter V rate 124  Relevant CV Studies: LHC 01/24/18  Diagnostic  Dominance: Right  Left Main  30% distal LM stenosis.  Left Anterior Descending  Luminal irregularities.  Ramus Intermedius  Large, no significant disease.  Left Circumflex  No significant disease.  Right Coronary Artery  No significant disease.     Laboratory Data:  High Sensitivity Troponin:   Recent Labs  Lab 02/15/20 1939  TROPONINIHS 7      Chemistry Recent Labs  Lab 02/15/20 1939  NA 141  K 3.7  CL 109  CO2 22  GLUCOSE 130*  BUN 10  CREATININE 1.16  CALCIUM 9.2  GFRNONAA >60  GFRAA >60  ANIONGAP 10    Hematology Recent  Labs  Lab 02/15/20 1939  WBC 8.7  RBC 4.85  HGB 15.0  HCT 45.3  MCV 93.4  MCH 30.9  MCHC 33.1  RDW 12.4  PLT 196   Radiology/Studies:  DG Chest Portable 1 View  Result Date: 02/15/2020 CLINICAL DATA:  Hypotension with diaphoresis and tachycardia. EXAM: PORTABLE CHEST 1 VIEW COMPARISON:  May 14, 2018 FINDINGS: Mild areas of atelectasis and/or infiltrate are seen within the bilateral lung bases. There is no evidence of a pleural effusion or pneumothorax. The heart size and mediastinal contours are within normal limits. The visualized skeletal structures are unremarkable. IMPRESSION: Mild bibasilar atelectasis and/or infiltrate. Electronically Signed   By: Aram Candela M.D.   On: 02/15/2020 20:10    Assessment and Plan:   Principal Problem:   Atrial Flutter with rapid ventricular response Active Problems:   OSA (obstructive sleep apnea)   Persistent atrial fibrillation (HCC)   Essential hypertension   S/P Minimally invasive maze operation for atrial fibrillation and LAA ligation Hx of thrombocytopenia  Morbidity Obesity  1. SVT - 1. Rate improved on cardizem 2. S/p Adenosine; likely atypical atrial flutter (hx of Afib) 3. Continue Xarelto; will keep NPO at midnight for possible DCCV 4. TSH pending; may restart amiodarone 5. Echo in AM 6. Tele now 7. Thrombocytopenia stable on AC 2. OSA - cont CPAP  3. Essential Hypertension 1. Holding home meds with present BP; will re assess post sinus rhythm 4. Insomnia 1. Continue home meds   Severity of Illness: The appropriate patient status for this patient is OBSERVATION. Observation status is judged to be  reasonable and necessary in order to provide the required intensity of service to ensure the patient's safety. The patient's presenting symptoms, physical exam findings, and initial radiographic and laboratory data in the context of their medical condition is felt to place them at decreased risk for further clinical  deterioration. Furthermore, it is anticipated that the patient will be medically stable for discharge from the hospital within 2 midnights of admission. The following factors support the patient status of observation.   " The patient's presenting symptoms include palpitations. " The physical exam findings include tachycardia. " The initial radiographic and laboratory data are suggestive of A fl with rapid ventricular response.     For questions or updates, please contact [facility] Please consult [email or web address] for contact info under     Signed, Christell Constant, MD  02/15/2020 9:29 PM

## 2020-02-16 ENCOUNTER — Observation Stay (HOSPITAL_BASED_OUTPATIENT_CLINIC_OR_DEPARTMENT_OTHER): Payer: 59

## 2020-02-16 DIAGNOSIS — I361 Nonrheumatic tricuspid (valve) insufficiency: Secondary | ICD-10-CM | POA: Diagnosis not present

## 2020-02-16 DIAGNOSIS — I428 Other cardiomyopathies: Secondary | ICD-10-CM | POA: Diagnosis not present

## 2020-02-16 DIAGNOSIS — I2510 Atherosclerotic heart disease of native coronary artery without angina pectoris: Secondary | ICD-10-CM

## 2020-02-16 DIAGNOSIS — I248 Other forms of acute ischemic heart disease: Secondary | ICD-10-CM | POA: Diagnosis not present

## 2020-02-16 DIAGNOSIS — I4892 Unspecified atrial flutter: Secondary | ICD-10-CM

## 2020-02-16 LAB — BASIC METABOLIC PANEL
Anion gap: 7 (ref 5–15)
BUN: 9 mg/dL (ref 6–20)
CO2: 24 mmol/L (ref 22–32)
Calcium: 8.8 mg/dL — ABNORMAL LOW (ref 8.9–10.3)
Chloride: 110 mmol/L (ref 98–111)
Creatinine, Ser: 0.99 mg/dL (ref 0.61–1.24)
GFR calc Af Amer: >60 mL/min (ref >60)
GFR calc non Af Amer: >60 mL/min (ref >60)
Glucose, Bld: 95 mg/dL (ref 70–99)
Potassium: 3.9 mmol/L (ref 3.5–5.1)
Sodium: 141 mmol/L (ref 135–145)

## 2020-02-16 LAB — CBC
HCT: 44 % (ref 39.0–52.0)
Hemoglobin: 14.6 g/dL (ref 13.0–17.0)
MCH: 31.4 pg (ref 26.0–34.0)
MCHC: 33.2 g/dL (ref 30.0–36.0)
MCV: 94.6 fL (ref 80.0–100.0)
Platelets: 157 10*3/uL (ref 150–400)
RBC: 4.65 MIL/uL (ref 4.22–5.81)
RDW: 12.6 % (ref 11.5–15.5)
WBC: 5.3 10*3/uL (ref 4.0–10.5)
nRBC: 0 % (ref 0.0–0.2)

## 2020-02-16 LAB — TSH: TSH: 2.476 u[IU]/mL (ref 0.350–4.500)

## 2020-02-16 LAB — ECHOCARDIOGRAM COMPLETE
Area-P 1/2: 2.99 cm2
Height: 74 in
S' Lateral: 3.5 cm
Weight: 4800 oz

## 2020-02-16 MED ORDER — METOPROLOL SUCCINATE ER 25 MG PO TB24: 25.0000 mg | ORAL_TABLET | Freq: Every day | ORAL | 2 refills | Status: DC

## 2020-02-16 MED ORDER — ETOMIDATE 2 MG/ML IV SOLN
INTRAVENOUS | Status: AC | PRN
  Administered 2020-02-16: 10 mg via INTRAVENOUS

## 2020-02-16 MED ORDER — ETOMIDATE 2 MG/ML IV SOLN
10.0000 mg | Freq: Once | INTRAVENOUS | Status: DC
  Filled 2020-02-16: qty 10

## 2020-02-16 MED ORDER — PERFLUTREN LIPID MICROSPHERE
1.0000 mL | INTRAVENOUS | Status: DC | PRN
  Administered 2020-02-16: 2 mL via INTRAVENOUS
  Filled 2020-02-16: qty 10

## 2020-02-16 NOTE — ED Provider Notes (Signed)
I was asked to assist in the management of this 59 year old male here with tachyarrhythmia, found to be in atrial flutter.  Cardiology is involved in the patient's care and he needs cardioversion but there is no room for him in the cardioversion suite upstairs.  He is otherwise feeling well, has been fully anticoagulated for 3+ weeks, and he would be a candidate for discharge so long as we can convert him to sinus rhythm.  Patient was very agreeable to ED cardioversion, he was fully consented and the procedure went smoothly as described below.  Cardiology will reevaluate and possibly perform repeat echocardiogram and then likely discharge soon after.  .Critical Care Performed by: Sabas Sous, MD Authorized by: Sabas Sous, MD   Critical care provider statement:    Critical care time (minutes):  32   Critical care was necessary to treat or prevent imminent or life-threatening deterioration of the following conditions: Atrial flutter with RVR.   Critical care was time spent personally by me on the following activities:  Discussions with consultants, evaluation of patient's response to treatment, examination of patient, ordering and performing treatments and interventions, ordering and review of laboratory studies, ordering and review of radiographic studies, pulse oximetry, re-evaluation of patient's condition, obtaining history from patient or surrogate and review of old charts   I assumed direction of critical care for this patient from another provider in my specialty: yes   .Sedation  Date/Time: 02/16/2020 10:51 AM Performed by: Sabas Sous, MD Authorized by: Sabas Sous, MD   Consent:    Consent obtained:  Verbal and written   Consent given by:  Patient   Risks discussed:  Allergic reaction, dysrhythmia, inadequate sedation, nausea, vomiting, respiratory compromise necessitating ventilatory assistance and intubation and prolonged hypoxia resulting in organ damage Universal  protocol:    Immediately prior to procedure a time out was called: yes   Indications:    Procedure performed:  Cardioversion   Procedure necessitating sedation performed by:  Physician performing sedation Pre-sedation assessment:    Time since last food or drink:  4+ hours   ASA classification: class 1 - normal, healthy patient     Neck mobility: normal     Mouth opening:  3 or more finger widths   Mallampati score:  I - soft palate, uvula, fauces, pillars visible   Pre-sedation assessments completed and reviewed: airway patency, cardiovascular function, hydration status, mental status, nausea/vomiting, pain level, respiratory function and temperature   Immediate pre-procedure details:    Reassessment: Patient reassessed immediately prior to procedure     Reviewed: vital signs, relevant labs/tests and NPO status     Verified: bag valve mask available, emergency equipment available, intubation equipment available, IV patency confirmed, oxygen available and suction available   Procedure details (see MAR for exact dosages):    Preoxygenation:  Nasal cannula   Sedation:  Etomidate   Intended level of sedation: deep   Intra-procedure monitoring:  Blood pressure monitoring, cardiac monitor, continuous capnometry, continuous pulse oximetry, frequent vital sign checks and frequent LOC assessments   Intra-procedure events: none     Total Provider sedation time (minutes):  18 Post-procedure details:    Attendance: Constant attendance by certified staff until patient recovered     Recovery: Patient returned to pre-procedure baseline     Post-sedation assessments completed and reviewed: airway patency, cardiovascular function, hydration status, mental status, nausea/vomiting, pain level, respiratory function and temperature     Patient is stable for discharge or admission: yes  Patient tolerance:  Tolerated well, no immediate complications Comments:     Sedation using 10 mg etomidate,  well-tolerated. .Cardioversion  Date/Time: 02/16/2020 10:53 AM Performed by: Sabas Sous, MD Authorized by: Sabas Sous, MD   Consent:    Consent obtained:  Verbal and written   Consent given by:  Patient   Risks discussed:  Cutaneous burn, death, induced arrhythmia and pain Pre-procedure details:    Cardioversion basis:  Elective   Rhythm:  Atrial flutter   Electrode placement:  Anterior-posterior Patient sedated: Yes. Refer to sedation procedure documentation for details of sedation.  Attempt one:    Cardioversion mode:  Synchronous   Waveform:  Biphasic   Shock (Joules):  150   Shock outcome:  Conversion to normal sinus rhythm Post-procedure details:    Patient status:  Awake   Patient tolerance of procedure:  Tolerated well, no immediate complications      Sabas Sous, MD 02/16/20 1054

## 2020-02-16 NOTE — Progress Notes (Signed)
  Echocardiogram 2D Echocardiogram has been performed.  Pieter Partridge 02/16/2020, 12:03 PM

## 2020-02-16 NOTE — Discharge Instructions (Signed)
Start Metoprolol succinate (Toprol-XL) 25mg  daily. Continue Xarelto 20mg  daily. It is important that you do not miss any doses of this after being cardioverted.  Follow-up with Dr. scheduled for 03/08/2020 at 10:00am.

## 2020-02-16 NOTE — Discharge Summary (Addendum)
Discharge Summary    Patient ID: Joseph Garrett MRN: [id number]; DOB: 1961-02-17  Admit date: 02/15/2020 Discharge date: 02/16/2020  Primary Care Provider: Patient, No Pcp Per  Primary Cardiologist: Hillis Range, MD  Primary Electrophysiologist:  Hillis Range, MD   Discharge Diagnoses    Principal Problem:   Atrial flutter with rapid ventricular response (HCC) Active Problems:   Hypercholesteremia   NICM (nonischemic cardiomyopathy) (HCC)   Essential hypertension   Chronic combined systolic and diastolic CHF (congestive heart failure) (HCC)   CAD (coronary artery disease)    Diagnostic Studies/Procedures    Echocardiogram 02/16/2020: Final results pending. However, Dr. Bjorn Pippin reviewed and reported LVEF of 40-45% _____________   History of Present Illness     Joseph Garrett is a 59 y.o. male with non-obstructive CAD on cath in 12/2015, persistent atrial fibrillation s/p MAZE procedure in 2013 with prior LAA clipping on Xarelto, atrial flutter previously on Amiodarone, chronic combined CHF/non-ischemic cardiomyopathy (suspect tachy-mediated) with EF as low as 25-30% in 08/2012 but improved to 50-55% on last Echo in 2017, asymptomatic bradycardia, obstructive sleep apnea, prior TIA, hypertension, hyperlipidemia who is followed by Dr. [name].  Patient presented to the ED on 02/15/2020 with complaints of palpitations and nausea (vomitied once while in the ED). He had similar symptoms prior to admission in 2019 and was found to be in atrial flutter at that time. Patient reported he got off work at noon on day of admission and felt unwell. He was in his usual state of health prior to this but notes he felt dehydrated and thinks this has been a trigger in the past.   Upon arrival to the ED, he was noted to be tachycardic and hypotensive. Hypotension occurred in setting of nausea and imrpoved with Zofran so this was felt to be vasovagal in nature. EKG showed SVT with rates in the 200's  and diffuse ST depressions. Patient was started on IV Cardizem and rates improved to the 120's and ST changes resolved. Repeat EKG looked consistent with atrial flutter. Adenosine 12mg  was given and confirmed atrial flutter. Patient was admitted for further management.  Hospital Course     Consultants:   Patient admitted with atrial flutter with rates as high as the 200's as stated above. TSH and potassium normal. He was started on IV Cardizem in the ED with improvement of rates to the low 100's to 120's. Patient noted compliance with home Xarelto and denied missing any doses in the last month so plan was for DCCV. However, unfortunately there was no room in the Endoscopy suite. Dr. discussed with ED provider (Dr. Milus Mallick) who kindly agreed to cardiovert patient in the ED. Cardioversion was successful. Patient tolerated procedure well. He was initially mildly bradycardic with rates in the 40's but rates have improved to the 60's. No recurrent atrial flutter. Occasional PVC noted on telemetry. Echo performed. Final reading pending but Dr. Pilar Plate reviewed images and reported LVEF looks mildly reduced at 40-45%. Patient had Cardizem CD 240mg  daily and Coreg as needed listed under PTA medications. However, he states he is not taking either of these at home. Will have patient start Toprol-XL 25mg  daily tomorrow. Patient has an automatic BP machine at home. Advised him to check BP and heart rate if he starts to have symptoms of palpitations, lightheaded, dizziness, etc. Patient's brother can drive him home and he has someone who can stay with him today so felt to be stable for discharge. Will make sure patient can ambulate without  any problems prior to discharge from the ED. Will arrange follow-up with Dr. [name].   Of note initial high-sensitivity troponin negative but repeat minimally elevated at 40. Initial EKG showed ST elevation in aVR and diffuse ST depression but this was when his rate was 200 bpm.  ST changes resolved with improvement in rate. Patient noted very mild left sided chest discomfort when rates were in the 200's but completely resolved with rate control. Felt to be due to demand ischemia in setting of atrial flutter with RVR.   Patient seen and examined by Dr. Bjorn Pippin today and felt to be stable for discharge following DCCV. Outpatient follow-up has been arranged. Medications as below.  Did the patient have an acute coronary syndrome (MI, NSTEMI, STEMI, etc) this admission?:  No.   The elevated Troponin was due to the acute medical illness (demand ischemia).  _____________  Discharge Vitals Blood pressure 110/71, pulse 64, temperature 97.8 F (36.6 C), resp. rate 17, height  (1.88 m), weight 136.1 kg, SpO2 97 %.  Filed Weights   02/15/20 1931  Weight: 136.1 kg    Labs & Radiologic Studies    CBC Recent Labs    02/15/20 1939 02/16/20 0442  WBC 8.7 5.3  HGB 15.0 14.6  HCT 45.3 44.0  MCV 93.4 94.6  PLT 196 157   Basic Metabolic Panel Recent Labs    16/10/96 1939 02/16/20 0442  NA 141 141  K 3.7 3.9  CL 109 110  CO2 22 24  GLUCOSE 130* 95  BUN 10 9  CREATININE 1.16 0.99  CALCIUM 9.2 8.8*   Liver Function Tests No results for input(s): AST, ALT, ALKPHOS, BILITOT, PROT, ALBUMIN in the last 72 hours. No results for input(s): LIPASE, AMYLASE in the last 72 hours. High Sensitivity Troponin:   Recent Labs  Lab 02/15/20 1939 02/15/20 2216  TROPONINIHS 7 40*    BNP Invalid input(s): POCBNP D-Dimer No results for input(s): DDIMER in the last 72 hours. Hemoglobin A1C No results for input(s): HGBA1C in the last 72 hours. Fasting Lipid Panel No results for input(s): CHOL, HDL, LDLCALC, TRIG, CHOLHDL, LDLDIRECT in the last 72 hours. Thyroid Function Tests Recent Labs    02/16/20 0442  TSH 2.476   _____________  DG Chest Portable 1 View  Result Date: 02/15/2020 CLINICAL DATA:  Hypotension with diaphoresis and tachycardia. EXAM: PORTABLE CHEST  1 VIEW COMPARISON:  May 14, 2018 FINDINGS: Mild areas of atelectasis and/or infiltrate are seen within the bilateral lung bases. There is no evidence of a pleural effusion or pneumothorax. The heart size and mediastinal contours are within normal limits. The visualized skeletal structures are unremarkable. IMPRESSION: Mild bibasilar atelectasis and/or infiltrate. Electronically Signed   By: Aram Candela M.D.   On: 02/15/2020 20:10   ECHOCARDIOGRAM COMPLETE  Result Date: 02/16/2020    ECHOCARDIOGRAM REPORT   Patient Name:   KASE SHUGHART [facility] Date of Exam: 02/16/2020 Medical Rec #:  [id number]        Height:       74.0 in Accession #:    [id number]       Weight:       300.0 lb Date of Birth:  [birth year]        BSA:          2.582 m Patient Age:    58 years         BP:           106/68 mmHg Patient Gender: M  HR:           65 bpm. Exam Location:  Inpatient Procedure: 2D Echo, Cardiac Doppler, Color Doppler and Intracardiac            Opacification Agent Indications:    Atrial flutter  History:        Patient has prior history of Echocardiogram examinations, most                 recent 05/15/2018. CHF, TIA, Arrythmias:Atrial Flutter and s/p                 MAZE procedure 2013; Risk Factors:Hypertension, Dyslipidemia and                 morbid obesity.  Sonographer:    Lavenia Atlas Referring Phys: [id number] MAHESH A CHANDRASEKHAR IMPRESSIONS  1. Abnormal septal motion . Left ventricular ejection fraction, by estimation, is 50 to 55%. The left ventricle has low normal function. The left ventricle has no regional wall motion abnormalities. Left ventricular diastolic parameters were normal.  2. Right ventricular systolic function is mildly reduced. The right ventricular size is mildly enlarged. There is mildly elevated pulmonary artery systolic pressure.  3. The mitral valve is normal in structure. Trivial mitral valve regurgitation. No evidence of mitral stenosis.  4. The aortic valve is  tricuspid. Aortic valve regurgitation is not visualized. No aortic stenosis is present.  5. The inferior vena cava is normal in size with greater than 50% respiratory variability, suggesting right atrial pressure of 3 mmHg. FINDINGS  Left Ventricle: Abnormal septal motion. Left ventricular ejection fraction, by estimation, is 50 to 55%. The left ventricle has low normal function. The left ventricle has no regional wall motion abnormalities. The left ventricular internal cavity size was normal in size. There is no left ventricular hypertrophy. Left ventricular diastolic parameters were normal. Right Ventricle: The right ventricular size is mildly enlarged. No increase in right ventricular wall thickness. Right ventricular systolic function is mildly reduced. There is mildly elevated pulmonary artery systolic pressure. The tricuspid regurgitant  velocity is 2.71 m/s, and with an assumed right atrial pressure of 8 mmHg, the estimated right ventricular systolic pressure is 37.4 mmHg. Left Atrium: Left atrial size was normal in size. Right Atrium: Right atrial size was normal in size. Pericardium: There is no evidence of pericardial effusion. Mitral Valve: The mitral valve is normal in structure. Normal mobility of the mitral valve leaflets. Trivial mitral valve regurgitation. No evidence of mitral valve stenosis. Tricuspid Valve: The tricuspid valve is normal in structure. Tricuspid valve regurgitation is mild . No evidence of tricuspid stenosis. Aortic Valve: The aortic valve is tricuspid. Aortic valve regurgitation is not visualized. No aortic stenosis is present. Pulmonic Valve: The pulmonic valve was normal in structure. Pulmonic valve regurgitation is not visualized. No evidence of pulmonic stenosis. Aorta: The aortic root is normal in size and structure. Venous: The inferior vena cava is normal in size with greater than 50% respiratory variability, suggesting right atrial pressure of 3 mmHg. IAS/Shunts: No atrial  level shunt detected by color flow Doppler.  LEFT VENTRICLE PLAX 2D LVIDd:         4.50 cm  Diastology LVIDs:         3.50 cm  LV e' lateral:   7.18 cm/s LV PW:         1.20 cm  LV E/e' lateral: 8.8 LV IVS:        1.10 cm  LV e' medial:  7.40 cm/s LVOT diam:     2.20 cm  LV E/e' medial:  8.5 LV SV:         41 LV SV Index:   16 LVOT Area:     3.80 cm  RIGHT VENTRICLE RV Basal diam:  4.50 cm RV S prime:     8.70 cm/s TAPSE (M-mode): 3.2 cm LEFT ATRIUM             Index       RIGHT ATRIUM           Index LA diam:        3.30 cm 1.28 cm/m  RA Area:     35.10 cm LA Vol (A2C):   63.5 ml 24.59 ml/m RA Volume:   167.00 ml 64.67 ml/m LA Vol (A4C):   91.3 ml 35.36 ml/m LA Biplane Vol: 82.6 ml 31.99 ml/m  AORTIC VALVE LVOT Vmax:   58.10 cm/s LVOT Vmean:  38.000 cm/s LVOT VTI:    0.109 m  AORTA Ao Root diam: 3.50 cm MITRAL VALVE               TRICUSPID VALVE MV Area (PHT): 2.99 cm    TR Peak grad:   29.4 mmHg MV Decel Time: 254 msec    TR Vmax:        271.00 cm/s MV E velocity: 63.00 cm/s MV A velocity: 14.60 cm/s  SHUNTS MV E/A ratio:  4.32        Systemic VTI:  0.11 m                            Systemic Diam: 2.20 cm Charlton Haws MD Electronically signed by Charlton Haws MD Signature Date/Time: 02/16/2020/12:24:13 PM    Final    Disposition   Patient is being discharged home today in good condition.  Follow-up Plans & Appointments     Follow-up Information    Hillis Range, MD Follow up.   Specialty: Cardiology Why: Follow-up scheduled for 03/08/2020 at 10:00am. Please arrive 15 minutes early for check-in. If this date/time does not work for you, please call our office to reschedule. Contact information: [address] [place] [address] [phone]              Discharge Instructions    Diet - low sodium heart healthy   Complete by: As directed    Increase activity slowly   Complete by: As directed       Discharge Medications   Allergies as of 02/16/2020      Reactions     Penicillins Rash   Has patient had a PCN reaction causing immediate rash, facial/tongue/throat swelling, SOB or lightheadedness with hypotension: Yes Has patient had a PCN reaction causing severe rash involving mucus membranes or skin necrosis: No Has patient had a PCN reaction that required hospitalization No Has patient had a PCN reaction occurring within the last 10 years: No If all of the above answers are "NO", then may proceed with Cephalosporin use.      Medication List    STOP taking these medications   carvedilol 3.125 MG tablet Commonly known as: COREG   diclofenac sodium 1 % Gel Commonly known as: VOLTAREN   diltiazem 240 MG 24 hr capsule Commonly known as: CARDIZEM CD     TAKE these medications   ergocalciferol 1.25 MG (50000 UT) capsule Commonly known as: VITAMIN D2 Take 50,000 Units by mouth  once a week.   halobetasol 0.05 % cream Commonly known as: ULTRAVATE Apply 0.5 g topically 2 (two) times daily.   lisinopril 5 MG tablet Commonly known as: ZESTRIL Take 5 mg by mouth daily.   metoprolol succinate 25 MG 24 hr tablet Commonly known as: Toprol XL Take 1 tablet (25 mg total) by mouth daily.   milk thistle 175 MG tablet Take 175 mg by mouth daily.   rivaroxaban 20 MG Tabs tablet Commonly known as: XARELTO Take 1 tablet (20 mg total) by mouth daily with supper.   sertraline 100 MG tablet Commonly known as: ZOLOFT Take 150 mg by mouth every morning.   simvastatin 40 MG tablet Commonly known as: ZOCOR Take 40 mg by mouth daily.   TYLENOL ARTHRITIS PAIN PO Take 1 tablet by mouth daily.   zolpidem 10 MG tablet Commonly known as: AMBIEN Take 10 mg by mouth at bedtime as needed for sleep.          Outstanding Labs/Studies   Final Echo results.  Duration of Discharge Encounter   Greater than 30 minutes including physician time.  Signed, Corrin Parker, PA-C 02/16/2020, 12:42 PM

## 2020-02-16 NOTE — ED Notes (Addendum)
Ambulated in hall with this RN without difficulty.  Brother at bedside, will be driving pt home.

## 2020-02-16 NOTE — Progress Notes (Addendum)
Progress Note  Patient Name: Joseph Garrett Date of Encounter: 02/16/2020  [facility] Cardiologist: Hillis Range, MD   Subjective   Patient presented with SVT. After IV Cardizem and dose of Adenosine, it looks like atrial flutter. He presented with palpitations, slight left-sided chest discomfort, and shortness of breath as well as nausea. All of symptoms have resolved with control of heart rate.  Inpatient Medications    Scheduled Meds: . ondansetron      . rivaroxaban  20 mg Oral Q supper  . sertraline  150 mg Oral Daily  . simvastatin  40 mg Oral Daily   Continuous Infusions: . diltiazem (CARDIZEM) infusion 7.5 mg/hr (02/15/20 2005)   PRN Meds: acetaminophen, ondansetron (ZOFRAN) IV, zolpidem   Vital Signs    Vitals:   02/15/20 2335 02/16/20 0130 02/16/20 0230 02/16/20 0401  BP: 125/81 129/89 (!) 122/92 110/80  Pulse: (!) 112 (!) 106 (!) 110 (!) 110  Resp: 12 15 15 18   Temp:      TempSrc:      SpO2: 100% 94% 93% 94%  Weight:      Height:       No intake or output data in the 24 hours ending 02/16/20 0643 Last 3 Weights 02/15/2020 06/01/2018 05/25/2018  Weight (lbs) 300 lb 308 lb 311 lb  Weight (kg) 136.079 kg 139.708 kg 141.069 kg      Telemetry    Atrial flutter. Initially in the 200's but currently in the low 100's to 120's. Multiple PVCs noted as well as short runs of NSVT (longest run being 3 beats). - Personally Reviewed  ECG    EKGs from 02/15/2020 personally reviewed: - Initial EKG at 19:36 showed narrow complex tachycardia with rate of 204 bpm (SVT vs atrial flutter with RVR). ST elevated noted in AVR and mild diffuse ST depression elsewhere.  - EKG at 20:17 looks more consistent with atrial flutter with rates of 121 bpm. ST changes improved with more rate control. - EKG at 20:57 was captured after administration of Adenosine and shows clear flutter waves.   Physical Exam   GEN: No acute distress.   Neck: No JVD. Cardiac: Tachycardic and  slightly irregular. No murmurs, rubs, or gallops.  Respiratory: Clear to auscultation bilaterally. GI: Soft, non-tender, non-distended  MS: Trace lower extremity edema bilaterally. No deformity. Skin: Warm and dry. Neuro:  No focal deficits. Psych: Normal affect. Responds appropriately.  Labs    High Sensitivity Troponin:   Recent Labs  Lab 02/15/20 1939 02/15/20 2216  TROPONINIHS 7 40*      Chemistry Recent Labs  Lab 02/15/20 1939 02/16/20 0442  NA 141 141  K 3.7 3.9  CL 109 110  CO2 22 24  GLUCOSE 130* 95  BUN 10 9  CREATININE 1.16 0.99  CALCIUM 9.2 8.8*  GFRNONAA >60 >60  GFRAA >60 >60  ANIONGAP 10 7     Hematology Recent Labs  Lab 02/15/20 1939 02/16/20 0442  WBC 8.7 5.3  RBC 4.85 4.65  HGB 15.0 14.6  HCT 45.3 44.0  MCV 93.4 94.6  MCH 30.9 31.4  MCHC 33.1 33.2  RDW 12.4 12.6  PLT 196 157    BNPNo results for input(s): BNP, PROBNP in the last 168 hours.   DDimer No results for input(s): DDIMER in the last 168 hours.   Radiology    DG Chest Portable 1 View  Result Date: 02/15/2020 CLINICAL DATA:  Hypotension with diaphoresis and tachycardia. EXAM: PORTABLE CHEST 1 VIEW COMPARISON:  May 14, 2018 FINDINGS: Mild areas of atelectasis and/or infiltrate are seen within the bilateral lung bases. There is no evidence of a pleural effusion or pneumothorax. The heart size and mediastinal contours are within normal limits. The visualized skeletal structures are unremarkable. IMPRESSION: Mild bibasilar atelectasis and/or infiltrate. Electronically Signed   By: Aram Candela M.D.   On: 02/15/2020 20:10    Cardiac Studies   Right/Left Heart Catheterization 01/25/2016: 1. Normal filling pressures.  2. Nonobstructive coronary disease.  3. EF 50-55%.  _______________  Echocardiogram 04/24/2016: Study Conclusions: - Left ventricle: LV longitudinal lV strain is abnormal at -12%.  The cavity size was normal. Systolic function was normal. The    estimated ejection fraction was in the range of 50% to 55%.  Diffuse hypokinesis.  - Aortic valve: Trileaflet; mildly thickened leaflets.  - Right ventricle: Systolic function was moderately reduced.  - Pulmonary arteries: PA peak pressure: 51 mm Hg (S).  - Pericardium, extracardiac: A small, free-flowing pericardial  effusion was identified along the right ventricular free wall.  The fluid had no internal echoes.There was no evidence of  hemodynamic compromise.   Impressions:  - The right ventricular systolic pressure was increased consistent  with moderate pulmonary hypertension.   Patient Profile     59 y.o. male with a history of non-obstructive CAD on cath in 12/2015, persistent atrial fibrillation s/p MAZE procedure in 2013 with prior LAA clipping on Xarelto, atrial flutter previously on Amiodarone, chronic combined CHF/non-ischemic cardiomyopathy (suspect tachy-mediated) with EF as low as 25-30% in 08/2012 but improved to 50-55% on last Echo in 2017, asymptomatic bradycardia, obstructive sleep apnea, prior TIA, hypertension, hyperlipidemia who was admitted on 02/16/2020 with SVT that is felt to likely be atypical atrial flutter.   Assessment & Plan    SVT/ Atrial Flutter with RVR History of Atrial Flutter and Persistent Atrial Fibrillation s/p prior LAA clipping and MAZE Procedure in 2013 - Patient presented with SVT with rates in the 200's. Patient started on IV Cardizem with improvement in rates and was given a dose of Adenosine 12mg  which revealed atrial flutter.  - Reviewed telemetry - looks like he is still in atrial flutter with rates in the 100's to 120's. - Potassium 3.9. Will check Magnesium. - TSH normal. - Echo ordered. - Continue IV Cardizem.  - Continue home Xarelto.  - Suspect patient may need DCCV today. He is NPO and has not missed any doses of his Xarelto in the last month. Will discuss with MD. Patient agrees to this if needed.  Non-Obstructive  CAD Demand Ischemia - History of non-obstructive CAD on cath in 2017. - Initial EKG did shows some ST elevation in aVR and mild diffuse ST depressions but this was when she was going 200 bpm. These changes have improved as rate has improved. - Initial high-sensitivity troponin negative at 7 but repeat came back minimally elevated at 40. Suspect demand ischemia given atrial flutter with rates in the 200's.  - He had very mild left sided chest discomfort when rates were elevated which has since resolved.  - Do not anticipate any ischemic evaluation at this time. Could consider outpatient stress test.  Non-Ischemic Cardiomyopathy - LVEF as low as 25-30% in the past but improved to 50-55% on last 2D Echo in 2017. On most recent TEE in 05/2018, read as moderately depressed. - Repeat Echo order. - Patient appears euvolemic on exam. No need for diuretics at this time. - On Coreg and Liisnopril at home but this  are currently being held due to soft BP at times on Cardizem drip. Suspect we will be able to restart these prior to discharge. - Continue to monitor volume status closely.  Hypertension - History of hypertension. BP was initially 70/43 on arrival when heart rates in the 200's and when patient was nausea. BP improved after Zofran given so felt to in part to vasovagal reaction. Tolerating Cardizem drip well now. - Will hold home medications for now and continue IV Cardizem.  Hyperlipidemia - Continue home Simvastatin 40mg  daily.  Obstruction Sleep Apnea - Continue CPAP.  For questions or updates, please contact [facility] Please consult [email or web address] for contact info under        Signed, , PA-C  02/16/2020, 6:43 AM    Patient seen and examined.  Agree with above documentation.  On exam, patient is alert and oriented, regular rhythm, tachycardic, no murmurs, lungs CTAB, no LE edema or JVD.  Telemetry reviewed, currently in atrial flutter with rates 110s.  Rates  appear controlled on Cardizem drip.  He reports compliance with Xarelto, has not missed any doses in last 3 weeks.  He reports that he continues to be symptomatic in atrial flutter despite improved rate control.  Recommend DCCV.  Unfortunately does not appear to be any availability for cardioversion and endoscopy today, will discuss with ED about cardioverting in ED.  02/18/2020, MD

## 2020-02-16 NOTE — Sedation Documentation (Signed)
150 joules synchronized shock applied - pt converted from a.fib to sinus bradycardia.

## 2020-02-17 SURGERY — CARDIOVERSION
Anesthesia: General

## 2020-03-08 ENCOUNTER — Ambulatory Visit: Payer: 59 | Admitting: Internal Medicine

## 2020-05-02 ENCOUNTER — Other Ambulatory Visit: Payer: Self-pay

## 2020-05-02 MED ORDER — METOPROLOL SUCCINATE ER 25 MG PO TB24: 25.0000 mg | ORAL_TABLET | Freq: Every day | ORAL | 0 refills | Status: DC

## 2020-12-26 ENCOUNTER — Telehealth: Payer: Self-pay

## 2020-12-26 NOTE — Telephone Encounter (Signed)
NOTES ON FILE FROM [facility] [phone] REFERRAL SENT TO SCHEDULING 

## 2021-01-24 ENCOUNTER — Encounter: Payer: Self-pay | Admitting: Internal Medicine

## 2021-01-24 ENCOUNTER — Other Ambulatory Visit: Payer: Self-pay

## 2021-01-24 ENCOUNTER — Ambulatory Visit (INDEPENDENT_AMBULATORY_CARE_PROVIDER_SITE_OTHER): Payer: 59 | Admitting: Internal Medicine

## 2021-01-24 VITALS — BP 118/74 | HR 53 | Ht 74.5 in | Wt 313.6 lb

## 2021-01-24 DIAGNOSIS — G4733 Obstructive sleep apnea (adult) (pediatric): Secondary | ICD-10-CM | POA: Diagnosis not present

## 2021-01-24 DIAGNOSIS — I428 Other cardiomyopathies: Secondary | ICD-10-CM

## 2021-01-24 DIAGNOSIS — I4819 Other persistent atrial fibrillation: Secondary | ICD-10-CM

## 2021-01-24 DIAGNOSIS — I10 Essential (primary) hypertension: Secondary | ICD-10-CM

## 2021-01-24 DIAGNOSIS — I471 Supraventricular tachycardia: Secondary | ICD-10-CM

## 2021-01-24 NOTE — Patient Instructions (Addendum)
Medication Instructions:  Your physician recommends that you continue on your current medications as directed. Please refer to the Current Medication list given to you today.  Labwork: None ordered.  Testing/Procedures: None ordered.  Follow-Up: Your physician wants you to follow-up in: 05/30/21 at 9:45 am with Hillis Range, MD

## 2021-01-24 NOTE — Progress Notes (Signed)
PCP: Patient, No Pcp Per (Inactive) Primary Cardiologist: Arnette Felts Primary EP: Dr [name]  Joseph Garrett is a 60 y.o. male who presents today for routine electrophysiology followup.  Since last being seen in our clinic, the patient reports doing very well. I have not seen him since 2018.  He had medicine and ablation refractory atrial arrhythmias and underwent MAZE by Dr Cornelius Moras in 2017.  He has done amazingly well.  Over the past 5 years, he has had 3-4 episodes of atrial fibrillation.  Most recently, he had a narrow complex tachycardia in June for which he was seen in [place].  He required urgent cardioversion.  He has done well since.  His metoprolol was increased.  Today, he denies symptoms of palpitations, chest pain, shortness of breath,  lower extremity edema, dizziness, presyncope, or syncope.  The patient is otherwise without complaint today.   Past Medical History:  Diagnosis Date   CHF (congestive heart failure) (HCC)    Chronic combined systolic and diastolic CHF (congestive heart failure) (HCC)    Cirrhosis (HCC)    liver scarring on biopsy, presumed to be due to methotrexate   Depression    DJD (degenerative joint disease)    Essential hypertension 08/30/2013   GERD (gastroesophageal reflux disease)    not present   Gout    Hives    Hypercholesteremia    Hypertension    Mitral regurgitation - type I dysfunction - mild to moderate    Obesity    OSA (obstructive sleep apnea)    mild, uses CPAP   Paroxysmal atrial fibrillation (HCC)    Persistent atrial fibrillation (HCC) 03/02/2012   Psoriasis    S/P Minimally invasive maze operation for atrial fibrillation 03/20/2016   Complete bilateral atrial lesion set using cryothermy and bipolar radiofrequency ablation via right mini thoracotomy approach with oversewing of LA appendage   Shortness of breath dyspnea    exertion   Syncope 02/21/2014   Thrombocytopenia (HCC)    TIA (transient ischemic attack)    Past Surgical  History:  Procedure Laterality Date   ABLATION OF DYSRHYTHMIC FOCUS  09/17/2012   ATRIAL FIBRILLATION ABLATION  03/17/12   PVI and CTI ablation by Dr [name]   ATRIAL FIBRILLATION ABLATION N/A 03/17/2012   Procedure: ATRIAL FIBRILLATION ABLATION;  Surgeon: Hillis Range, MD;  Location: [facility] CATH LAB;  Service: Cardiovascular;  Laterality: N/A;   ATRIAL FIBRILLATION ABLATION N/A 09/18/2012   Procedure: ATRIAL FIBRILLATION ABLATION;  Surgeon: Hillis Range, MD;  Location: [facility] CATH LAB;  Service: Cardiovascular;  Laterality: N/A;   CARDIAC CATHETERIZATION N/A 01/25/2016   Procedure: Right/Left Heart Cath and Coronary Angiography;  Surgeon: Laurey Morale, MD;  Location: [facility] INVASIVE CV LAB;  Service: Cardiovascular;  Laterality: N/A;   CARDIOVERSION N/A 10/09/2012   Procedure: CARDIOVERSION;  Surgeon: Laurey Morale, MD;  Location: [facility] ENDOSCOPY;  Service: Cardiovascular;  Laterality: N/A;   CARDIOVERSION N/A 02/22/2014   Procedure: CARDIOVERSION;  Surgeon: Quintella Reichert, MD;  Location: [facility] ENDOSCOPY;  Service: Cardiovascular;  Laterality: N/A;   CARDIOVERSION N/A 12/29/2015   Procedure: CARDIOVERSION;  Surgeon: Hillis Range, MD;  Location: [facility] OR;  Service: Cardiovascular;  Laterality: N/A;   CARDIOVERSION N/A 05/15/2018   Procedure: CARDIOVERSION;  Surgeon: [name], MD;  Location: [facility] ENDOSCOPY;  Service: Cardiovascular;  Laterality: N/A;   CHOLECYSTECTOMY  2011   ELBOW ARTHROSCOPY Left 08/2014   ELECTROPHYSIOLOGIC STUDY N/A 12/29/2015   Procedure: Cardioversion;  Surgeon: Hillis Range, MD;  Location:  [facility] INVASIVE CV LAB;  Service: Cardiovascular;  Laterality: N/A;   EYE SURGERY Bilateral 04/2015   cataract surgery   KNEE ARTHROSCOPY Right    MINIMALLY INVASIVE MAZE PROCEDURE N/A 03/20/2016   Procedure: MINIMALLY INVASIVE MAZE PROCEDURE;  Surgeon: Purcell Nails, MD;  Location: [facility] OR;  Service: Open Heart Surgery;  Laterality: N/A;   NOSE SURGERY     Turbinates   RECONSTRUCTION OF NOSE     TEE  WITHOUT CARDIOVERSION  03/16/2012   Procedure: TRANSESOPHAGEAL ECHOCARDIOGRAM (TEE);  Surgeon: [name], MD;  Location: [facility] ENDOSCOPY;  Service: Cardiovascular;  Laterality: N/A;   TEE WITHOUT CARDIOVERSION N/A 09/17/2012   Procedure: TRANSESOPHAGEAL ECHOCARDIOGRAM (TEE);  Surgeon: Lewayne Bunting, MD;  Location: [facility] ENDOSCOPY;  Service: Cardiovascular;  Laterality: N/A;   TEE WITHOUT CARDIOVERSION N/A 01/25/2016   Procedure: TRANSESOPHAGEAL ECHOCARDIOGRAM (TEE);  Surgeon: Laurey Morale, MD;  Location: [facility] ENDOSCOPY;  Service: Cardiovascular;  Laterality: N/A;   TEE WITHOUT CARDIOVERSION N/A 03/20/2016   Procedure: TRANSESOPHAGEAL ECHOCARDIOGRAM (TEE);  Surgeon: Purcell Nails, MD;  Location: [facility] OR;  Service: Open Heart Surgery;  Laterality: N/A;   TEE WITHOUT CARDIOVERSION N/A 05/15/2018   Procedure: TRANSESOPHAGEAL ECHOCARDIOGRAM (TEE);  Surgeon: [name], MD;  Location: [facility] ENDOSCOPY;  Service: Cardiovascular;  Laterality: N/A;   TENDON REPAIR     Right Forearm   TENDON REPAIR Right 1990's   forearm    ROS- all systems are reviewed and negatives except as per HPI above  Current Outpatient Medications  Medication Sig Dispense Refill   Acetaminophen (TYLENOL ARTHRITIS PAIN PO) Take 1 tablet by mouth daily.     ergocalciferol (VITAMIN D2) 1.25 MG (50000 UT) capsule Take 50,000 Units by mouth once a week.     halobetasol (ULTRAVATE) 0.05 % cream Apply 0.5 g topically 2 (two) times daily.     lisinopril (PRINIVIL,ZESTRIL) 5 MG tablet Take 5 mg by mouth daily.     milk thistle 175 MG tablet Take 175 mg by mouth daily.     rivaroxaban (XARELTO) 20 MG TABS tablet Take 1 tablet (20 mg total) by mouth daily with supper. 30 tablet 1   sertraline (ZOLOFT) 100 MG tablet Take 150 mg by mouth every morning.      simvastatin (ZOCOR) 40 MG tablet Take 40 mg by mouth daily.     zolpidem (AMBIEN) 10 MG tablet Take 10 mg by mouth at bedtime as needed for sleep.     metoprolol succinate (TOPROL-XL)  50 MG 24 hr tablet Take 50 mg by mouth daily.     No current facility-administered medications for this visit.    Physical Exam: Vitals:   01/24/21 0954  BP: 118/74  Pulse: (!) 53  SpO2: 96%  Weight: (!) 313 lb 9.6 oz (142.2 kg)  Height: 6' 2.5" (1.892 m)    GEN- The patient is well appearing, alert and oriented x 3 today.   Head- normocephalic, atraumatic Eyes-  Sclera clear, conjunctiva pink Ears- hearing intact Oropharynx- clear Lungs- Clear to ausculation bilaterally, normal work of breathing Heart- Regular rate and rhythm, no murmurs, rubs or gallops, PMI not laterally displaced GI- soft, NT, ND, + BS Extremities- no clubbing, cyanosis, or edema  Wt Readings from Last 3 Encounters:  01/24/21 (!) 313 lb 9.6 oz (142.2 kg)  02/15/20 300 lb (136.1 kg)  06/01/18 (!) 308 lb (139.7 kg)    EKG tracing ordered today is personally reviewed and shows sinus bradycardia  Assessment and Plan:  Persistent  afib/ SVT He has done remarkably well post maze Chads2vasc score is 4.  He is s/p LAA removal but had prior TIA.  We have kept him on xarelto. He recently had narrow complex SVT.  I will try to obtain ekg from [facility].  He has also recently worn an event monitor through [facility].  I will also try to obtain this to review. No changes today Continue toprol 50mg  daily  2. HTN Continue toprol 50mg  daily  3. OSA He is compliant with CPAP   Risks, benefits and potential toxicities for medications prescribed and/or refilled reviewed with patient today.   Return in 4 months  MD, [facility] 01/24/2021 10:11 AM

## 2021-02-06 ENCOUNTER — Telehealth: Payer: Self-pay | Admitting: Internal Medicine

## 2021-02-06 NOTE — Telephone Encounter (Signed)
Received requested records from [facility]. AO 02/06/21

## 2023-01-29 ENCOUNTER — Ambulatory Visit: Payer: BC Managed Care – PPO | Attending: Cardiology | Admitting: Cardiology

## 2023-01-29 ENCOUNTER — Encounter: Payer: Self-pay | Admitting: Cardiology

## 2023-01-29 ENCOUNTER — Other Ambulatory Visit: Payer: Self-pay

## 2023-01-29 ENCOUNTER — Observation Stay (HOSPITAL_COMMUNITY)
Admission: EM | Admit: 2023-01-29 | Discharge: 2023-01-30 | Disposition: A | Discharge status: Home / Self Care | Payer: BC Managed Care – PPO | Attending: Cardiology | Admitting: Cardiology

## 2023-01-29 ENCOUNTER — Encounter (HOSPITAL_COMMUNITY): Payer: Self-pay

## 2023-01-29 VITALS — BP 110/70 | HR 210 | Ht 74.5 in | Wt 267.0 lb

## 2023-01-29 DIAGNOSIS — R000 Tachycardia, unspecified: Secondary | ICD-10-CM | POA: Diagnosis present

## 2023-01-29 DIAGNOSIS — R570 Cardiogenic shock: Secondary | ICD-10-CM | POA: Diagnosis not present

## 2023-01-29 DIAGNOSIS — I10 Essential (primary) hypertension: Secondary | ICD-10-CM

## 2023-01-29 DIAGNOSIS — Z7901 Long term (current) use of anticoagulants: Secondary | ICD-10-CM | POA: Diagnosis not present

## 2023-01-29 DIAGNOSIS — I5042 Chronic combined systolic (congestive) and diastolic (congestive) heart failure: Secondary | ICD-10-CM | POA: Diagnosis not present

## 2023-01-29 DIAGNOSIS — F39 Unspecified mood [affective] disorder: Secondary | ICD-10-CM | POA: Insufficient documentation

## 2023-01-29 DIAGNOSIS — I4819 Other persistent atrial fibrillation: Secondary | ICD-10-CM | POA: Diagnosis not present

## 2023-01-29 DIAGNOSIS — R002 Palpitations: Secondary | ICD-10-CM | POA: Diagnosis not present

## 2023-01-29 DIAGNOSIS — I110 Hypertensive heart disease with heart failure: Secondary | ICD-10-CM | POA: Insufficient documentation

## 2023-01-29 DIAGNOSIS — Z8673 Personal history of transient ischemic attack (TIA), and cerebral infarction without residual deficits: Secondary | ICD-10-CM | POA: Diagnosis not present

## 2023-01-29 DIAGNOSIS — I471 Supraventricular tachycardia, unspecified: Principal | ICD-10-CM | POA: Diagnosis present

## 2023-01-29 LAB — CBC WITH DIFFERENTIAL/PLATELET
Abs Immature Granulocytes: 0.08 10*3/uL — ABNORMAL HIGH (ref 0.00–0.07)
Basophils Absolute: 0 10*3/uL (ref 0.0–0.1)
Basophils Relative: 0 %
Eosinophils Absolute: 0.1 10*3/uL (ref 0.0–0.5)
Eosinophils Relative: 1 %
HCT: 45.9 % (ref 39.0–52.0)
Hemoglobin: 16 g/dL (ref 13.0–17.0)
Immature Granulocytes: 1 %
Lymphocytes Relative: 22 %
Lymphs Abs: 2.3 10*3/uL (ref 0.7–4.0)
MCH: 31.8 pg (ref 26.0–34.0)
MCHC: 34.9 g/dL (ref 30.0–36.0)
MCV: 91.3 fL (ref 80.0–100.0)
Monocytes Absolute: 1 10*3/uL (ref 0.1–1.0)
Monocytes Relative: 9 %
Neutro Abs: 6.8 10*3/uL (ref 1.7–7.7)
Neutrophils Relative %: 67 %
Platelets: 233 10*3/uL (ref 150–400)
RBC: 5.03 MIL/uL (ref 4.22–5.81)
RDW: 12.4 % (ref 11.5–15.5)
WBC: 10.2 10*3/uL (ref 4.0–10.5)
nRBC: 0 % (ref 0.0–0.2)

## 2023-01-29 LAB — COMPREHENSIVE METABOLIC PANEL
ALT: 28 U/L (ref 0–44)
AST: 28 U/L (ref 15–41)
Albumin: 3.9 g/dL (ref 3.5–5.0)
Alkaline Phosphatase: 62 U/L (ref 38–126)
Anion gap: 14 (ref 5–15)
BUN: 19 mg/dL (ref 8–23)
CO2: 16 mmol/L — ABNORMAL LOW (ref 22–32)
Calcium: 8.8 mg/dL — ABNORMAL LOW (ref 8.9–10.3)
Chloride: 108 mmol/L (ref 98–111)
Creatinine, Ser: 1.45 mg/dL — ABNORMAL HIGH (ref 0.61–1.24)
GFR, Estimated: 54 mL/min — ABNORMAL LOW (ref >60)
Glucose, Bld: 119 mg/dL — ABNORMAL HIGH (ref 70–99)
Potassium: 3.8 mmol/L (ref 3.5–5.1)
Sodium: 138 mmol/L (ref 135–145)
Total Bilirubin: 2.3 mg/dL — ABNORMAL HIGH (ref 0.3–1.2)
Total Protein: 6.1 g/dL — ABNORMAL LOW (ref 6.5–8.1)

## 2023-01-29 LAB — MAGNESIUM: Magnesium: 1.9 mg/dL (ref 1.7–2.4)

## 2023-01-29 LAB — CBC
HCT: 40.5 % (ref 39.0–52.0)
Hemoglobin: 14 g/dL (ref 13.0–17.0)
MCH: 32 pg (ref 26.0–34.0)
MCHC: 34.6 g/dL (ref 30.0–36.0)
MCV: 92.5 fL (ref 80.0–100.0)
Platelets: 141 10*3/uL — ABNORMAL LOW (ref 150–400)
RBC: 4.38 MIL/uL (ref 4.22–5.81)
RDW: 12.5 % (ref 11.5–15.5)
WBC: 8.6 10*3/uL (ref 4.0–10.5)
nRBC: 0 % (ref 0.0–0.2)

## 2023-01-29 LAB — TROPONIN I (HIGH SENSITIVITY)
Troponin I (High Sensitivity): 6 ng/L (ref <18)
Troponin I (High Sensitivity): 11 ng/L (ref <18)

## 2023-01-29 LAB — HIV ANTIBODY (ROUTINE TESTING W REFLEX): HIV Screen 4th Generation wRfx: NONREACTIVE

## 2023-01-29 LAB — CREATININE, SERUM
Creatinine, Ser: 1.24 mg/dL (ref 0.61–1.24)
GFR, Estimated: >60 mL/min (ref >60)

## 2023-01-29 MED ORDER — MELOXICAM 7.5 MG PO TABS
15.0000 mg | ORAL_TABLET | Freq: Every day | ORAL | Status: DC
  Filled 2023-01-29 (×3): qty 2

## 2023-01-29 MED ORDER — POTASSIUM CHLORIDE CRYS ER 20 MEQ PO TBCR
40.0000 meq | EXTENDED_RELEASE_TABLET | Freq: Once | ORAL | Status: AC
  Administered 2023-01-29: 40 meq via ORAL
  Filled 2023-01-29: qty 2

## 2023-01-29 MED ORDER — AMLODIPINE BESYLATE 5 MG PO TABS
5.0000 mg | ORAL_TABLET | Freq: Every day | ORAL | Status: DC
  Administered 2023-01-30: 5 mg via ORAL
  Filled 2023-01-29: qty 1

## 2023-01-29 MED ORDER — ONDANSETRON HCL 4 MG/2ML IJ SOLN: 4.0000 mg | Freq: Four times a day (QID) | INTRAMUSCULAR | Status: DC | PRN

## 2023-01-29 MED ORDER — FLECAINIDE ACETATE 100 MG PO TABS
100.0000 mg | ORAL_TABLET | Freq: Two times a day (BID) | ORAL | Status: DC
  Administered 2023-01-29 – 2023-01-30 (×2): 100 mg via ORAL
  Filled 2023-01-29: qty 1
  Filled 2023-01-29 (×2): qty 2

## 2023-01-29 MED ORDER — ETOMIDATE 2 MG/ML IV SOLN
5.0000 mg | Freq: Once | INTRAVENOUS | Status: AC
  Administered 2023-01-29: 5 mg via INTRAVENOUS
  Filled 2023-01-29: qty 10

## 2023-01-29 MED ORDER — MAGNESIUM SULFATE 2 GM/50ML IV SOLN
2.0000 g | Freq: Once | INTRAVENOUS | Status: AC
  Administered 2023-01-29: 2 g via INTRAVENOUS
  Filled 2023-01-29: qty 50

## 2023-01-29 MED ORDER — ZOLPIDEM TARTRATE 5 MG PO TABS
5.0000 mg | ORAL_TABLET | Freq: Every evening | ORAL | Status: DC | PRN
  Administered 2023-01-29: 5 mg via ORAL
  Filled 2023-01-29: qty 1

## 2023-01-29 MED ORDER — RIVAROXABAN 20 MG PO TABS
20.0000 mg | ORAL_TABLET | Freq: Every day | ORAL | Status: DC
  Administered 2023-01-29: 20 mg via ORAL
  Filled 2023-01-29: qty 2

## 2023-01-29 MED ORDER — LISINOPRIL 5 MG PO TABS
5.0000 mg | ORAL_TABLET | Freq: Every day | ORAL | Status: DC
  Administered 2023-01-30: 5 mg via ORAL
  Filled 2023-01-29: qty 1

## 2023-01-29 MED ORDER — ENOXAPARIN SODIUM 40 MG/0.4ML IJ SOSY: 40.0000 mg | PREFILLED_SYRINGE | INTRAMUSCULAR | Status: DC

## 2023-01-29 MED ORDER — ACETAMINOPHEN 325 MG PO TABS: 650.0000 mg | ORAL_TABLET | ORAL | Status: DC | PRN

## 2023-01-29 MED ORDER — METOPROLOL SUCCINATE ER 25 MG PO TB24
25.0000 mg | ORAL_TABLET | Freq: Every day | ORAL | Status: DC
  Administered 2023-01-29 – 2023-01-30 (×2): 25 mg via ORAL
  Filled 2023-01-29 (×2): qty 1

## 2023-01-29 NOTE — ED Notes (Signed)
Cardioverted sync 200 jules, by Doran Durand, MD, patient tolerated well.

## 2023-01-29 NOTE — ED Triage Notes (Signed)
PT BIB EMS from his cardiologist office for being in SVT, rate of 220.    Adenosine, 6 mg Adenosine 12 mg Adenosine 12 Zofran 4 mg  18 L Hand

## 2023-01-29 NOTE — Progress Notes (Signed)
Electrophysiology Office Follow up Visit Note:    Date:  01/29/2023   ID:  Joseph Garrett, DOB 1961-04-18, MRN [id number]  PCP:  Patient, No Pcp Per  [facility] Cardiologist:  Hillis Range, MD (Inactive)  [facility] Electrophysiologist:  Lanier Prude, MD    Interval History:    Joseph Garrett is a 62 y.o. male who presents for a follow up visit.   He was previously seen by Dr. [name] for his atrial fibrillation.  He has a history of hypertension, hyperlipidemia, obstructive sleep apnea with CPAP, atrial fibrillation/flutter.  He is on Xarelto for stroke prophylaxis.  He is also received care from [facility].  Dr. [name] last saw the patient January 24, 2021.  Prior to that it was in 2018.  He had a maze operation by [name] in 2017.  He did well following the maze operation until developing a narrow complex tachycardia requiring urgent cardioversion in [facility] prior to his appointment with Dr. [name].  He last saw [facility] October 28, 2022.  A heart monitor was ordered.  His beta-blocker was continued.  He was seen in the emergency department at [facility] on January 24, 2023.  Today he is with his wife in clinic.  He has been doing poorly for the last couple of weeks with episodes of diaphoresis, lightheadedness, palpitations.  The wife also describes a concerning series of events since January.  He has had extreme anger, emotional incontinence with inappropriate and unexplained laughter and crying.    Past medical, surgical, social and family history were reviewed.  ROS:   Please see the history of present illness.    All other systems reviewed and are negative.  EKGs/Labs/Other Studies Reviewed:    The following studies were reviewed today:  EKG today in clinic shows a long RP tachycardia with a ventricular rate greater than 200 bpm.  EKG Interpretation Date/Time:  Wednesday January 29 2023 14:36:00 EDT Ventricular Rate:  209 PR Interval:    QRS  Duration:  72 QT Interval:  208 QTC Calculation: 387 R Axis:   13  Text Interpretation: Supraventricular tachycardia Confirmed by Steffanie Dunn [phone]) on 01/29/2023 2:50:49 PM    Physical Exam:    VS:  BP 110/70 (BP Location: Left Arm, Patient Position: Sitting, Cuff Size: Large)   Pulse (!) 210   Ht 6' 2.5" (1.892 m)   Wt 267 lb (121.1 kg)   SpO2 96%   BMI 33.82 kg/m     Wt Readings from Last 3 Encounters:  01/29/23 267 lb (121.1 kg)  01/24/21 (!) 313 lb 9.6 oz (142.2 kg)  02/15/20 300 lb (136.1 kg)     GEN: Diaphoretic, inappropriately smiling/laughing.  Anxious appearing CARDIAC: Tachycardic, no murmurs, rubs, gallops RESPIRATORY:  Clear to auscultation without rales, wheezing or rhonchi       ASSESSMENT:    1. Persistent atrial fibrillation (HCC)   2. Palpitations   3. Primary hypertension    PLAN:    In order of problems listed above:  #Persistent atrial fibrillation Post maze operation by [name] in 2017.  Has had recurrence of tachycardia but it is unclear whether or not this is atrial flutter or atrial fibrillation or some other arrhythmia. Heart monitor was recently ordered by [facility]. Continue Xarelto for stroke prophylaxis  #Inappropriate laughter/crying, anger/pseudobulbar affect I am concerned about his changes since January.  I am not sure if this is at all related to his SVT but could this represent an  underlying neurologic disorder.  I would like to get neurology/psychiatry involved.  Unclear if he needs brain imaging or further infectious workup.     I am going to send him directly to the emergency department via EMS.  I would like to get a rhythm strip while adenosine is being administered to tease out the mechanism of his SVT.  Given his history of A-fib and maze operation, this could be in atrial flutter or could be a totally different SVT such as AVRT.      Signed, Steffanie Dunn, MD, [facility], [facility] 01/29/2023 3:44 PM     Electrophysiology [facility]

## 2023-01-29 NOTE — ED Provider Notes (Signed)
[employer] EMERGENCY DEPARTMENT AT [facility] Provider Note   CSN: [id number] Arrival date & time: 01/29/23  1539     History Chief Complaint  Patient presents with  . Tachycardia    SVT    HPI Joseph Garrett is a 62 y.o. male presenting for chief complaint palpitations.  62 year old male with history of multifocal tachycardia.  Follows with cardiology in outpatient setting (last echo 50 to 55% 2021).  Notably last admission was 2021 for a flutter with RVR.  In short 62 year old male with a history of CAD (nonobstructive) catheterization last 2017, atrial fibrillation status post ablation, on Xarelto complicated by follow-up atrial flutter (finished course of amnio) and tachycardic mediated cardiomyopathy   Patient's recorded medical, surgical, social, medication list and allergies were reviewed in the Snapshot window as part of the initial history.   Review of Systems   Review of Systems  Constitutional:  Negative for chills and fever.  HENT:  Negative for ear pain and sore throat.   Eyes:  Negative for pain and visual disturbance.  Respiratory:  Positive for chest tightness. Negative for cough and shortness of breath.   Cardiovascular:  Positive for palpitations. Negative for chest pain.  Gastrointestinal:  Negative for abdominal pain and vomiting.  Genitourinary:  Negative for dysuria and hematuria.  Musculoskeletal:  Negative for arthralgias and back pain.  Skin:  Negative for color change and rash.  Neurological:  Negative for seizures and syncope.  All other systems reviewed and are negative.   Physical Exam Updated Vital Signs BP (!) 132/96   Pulse 86   Temp 98 F (36.7 C) (Oral)   Resp (!) 22   SpO2 100%  Physical Exam Vitals and nursing note reviewed.  Constitutional:      General: He is not in acute distress.    Appearance: He is well-developed.  HENT:     Head: Normocephalic and atraumatic.  Eyes:     Conjunctiva/sclera: Conjunctivae normal.   Cardiovascular:     Rate and Rhythm: Normal rate and regular rhythm.     Heart sounds: No murmur heard. Pulmonary:     Effort: Pulmonary effort is normal. No respiratory distress.     Breath sounds: Normal breath sounds.  Abdominal:     Palpations: Abdomen is soft.     Tenderness: There is no abdominal tenderness.  Musculoskeletal:        General: No swelling.     Cervical back: Neck supple.  Skin:    General: Skin is warm and dry.     Capillary Refill: Capillary refill takes less than 2 seconds.  Neurological:     Mental Status: He is alert.  Psychiatric:        Mood and Affect: Mood normal.     ED Course/ Medical Decision Making/ A&P    Procedures .Critical Care  Performed by: Glyn Ade, MD Authorized by: Glyn Ade, MD   Critical care provider statement:    Critical care time (minutes):  85   Critical care was necessary to treat or prevent imminent or life-threatening deterioration of the following conditions:  Circulatory failure and cardiac failure   Critical care was time spent personally by me on the following activities:  Development of treatment plan with patient or surrogate, discussions with consultants, evaluation of patient's response to treatment, examination of patient, ordering and review of laboratory studies, ordering and review of radiographic studies, ordering and performing treatments and interventions, pulse oximetry, re-evaluation of patient's condition and review  of old charts   Care discussed with: admitting provider   .Sedation  Date/Time: 01/29/2023 11:50 PM  Performed by: Glyn Ade, MD Authorized by: Glyn Ade, MD   Consent:    Consent obtained:  Verbal and emergent situation   Consent given by:  Patient Universal protocol:    Immediately prior to procedure, a time out was called: yes   Pre-sedation assessment:    Time since last food or drink:  4 hours   ASA classification: class 2 - patient with mild  systemic disease     Mallampati score:  I - soft palate, uvula, fauces, pillars visible   Pre-sedation assessments completed and reviewed: airway patency, mental status and respiratory function   Procedure details (see MAR for exact dosages):    Total Provider sedation time (minutes):  25 Post-procedure details:    Attendance: Constant attendance by certified staff until patient recovered     Procedure completion:  Tolerated well, no immediate complications .Cardioversion  Date/Time: 01/29/2023 11:50 PM  Performed by: Glyn Ade, MD Authorized by: Glyn Ade, MD   Consent:    Consent obtained:  Verbal   Consent given by:  Patient   Risks discussed:  Cutaneous burn   Alternatives discussed:  No treatment Pre-procedure details:    Cardioversion basis:  Emergent   Rhythm:  Supraventricular tachycardia   Electrode placement:  Anterior-posterior Patient sedated: Yes. Refer to sedation procedure documentation for details of sedation.  Attempt one:    Cardioversion mode:  Synchronous   Waveform:  Biphasic   Shock (Joules):  200   Shock outcome:  No change in rhythm Post-procedure details:    Patient status:  Awake    Medications Ordered in ED Medications  acetaminophen (TYLENOL) tablet 650 mg (has no administration in time range)  ondansetron (ZOFRAN) injection 4 mg (has no administration in time range)  meloxicam (MOBIC) tablet 15 mg (has no administration in time range)  lisinopril (ZESTRIL) tablet 5 mg (has no administration in time range)  amLODipine (NORVASC) tablet 5 mg (has no administration in time range)  rivaroxaban (XARELTO) tablet 20 mg (20 mg Oral Given 01/29/23 2228)  flecainide (TAMBOCOR) tablet 100 mg (100 mg Oral Given 01/29/23 2227)  metoprolol succinate (TOPROL-XL) 24 hr tablet 25 mg (25 mg Oral Given 01/29/23 1834)  zolpidem (AMBIEN) tablet 5 mg (5 mg Oral Given 01/29/23 2228)  etomidate (AMIDATE) injection 5 mg (5 mg Intravenous Given 01/29/23 1603)   magnesium sulfate IVPB 2 g 50 mL (0 g Intravenous Stopped 01/29/23 2022)  potassium chloride SA (KLOR-CON M) CR tablet 40 mEq (40 mEq Oral Given 01/29/23 1818)    Medical Decision Making:    Joseph Garrett is a 62 y.o. male who presented to the ED today with chief complaint of palpitations detailed above. I was called emergently to bedside as patient's heart rate was 200.  Appears to be SVT that has not responded to the 3 doses of adenosine. Cardiology was at bedside on patient's arrival.  They agree with intervention given his ongoing hypotension.  He underwent synchronized cardioversion as above with restoration of normal sinus rhythm.  Higher dose cardioversion was utilized because patient has a history of nonresponse to lower doses. Cardiology at bedside following intervention.  They recommended p.o. metoprolol and admission for further care management.  Patient reassessed frequently to ensure restoration normal sinus rhythm.  Extensive time was spent at his bedside for serial reassessments, updating the family and development of the care plan.Marland Kitchen  Clinical Impression:  1. SVT (supraventricular tachycardia)   2. Cardiogenic shock (HCC)      Admit   Final Clinical Impression(s) / ED Diagnoses Final diagnoses:  SVT (supraventricular tachycardia)  Cardiogenic shock [facility])    Rx / DC Orders ED Discharge Orders          Ordered    Amb referral to AFIB Clinic        01/29/23 1650              Glyn Ade, MD 01/29/23 2353

## 2023-01-29 NOTE — H&P (Addendum)
ELECTROPHYSIOLOGY H&P NOTE    Patient ID: Joseph Garrett MRN: [id number], DOB/AGE: 10-11-1960 62 y.o.  Admit date: 01/29/2023 Date of Consult: 01/29/2023  Primary Physician: Patient, No Pcp Per Primary Cardiologist: Hillis Range, MD (Inactive)  Electrophysiologist: Dr. [name] -> Dr. Lalla Brothers   Reason for admission: SVT  Patient Profile: Joseph Garrett is a 62 y.o. male with a history of HN, HLD, OSA, CPAP, AF s/p ablation 2013 and redo 2014, MAZE 2017, AFL, on Xarelto for CVA prophylaxis who is being seen today for the evaluation of SVT at the request of Dr.Lambert.  HPI:  Joseph Garrett is a 62 y.o. male with history as above.   He was previously seen by Dr. [name] for his atrial fibrillation.  He has a history of hypertension, hyperlipidemia, obstructive sleep apnea with CPAP, atrial fibrillation/flutter.  He is on Xarelto for stroke prophylaxis.  He is also received care from [facility].   Dr. [name] last saw the patient January 24, 2021.  Prior to that it was in 2018.  He had a maze operation by [name] in 2017.  He did well following the maze operation until developing a narrow complex tachycardia requiring urgent cardioversion in [facility] prior to his appointment with Dr. [name].   Seen in [facility] 07/2022 for HRs in 200s and required cardioversion at that time.   He last saw [facility] October 28, 2022.  A heart monitor was ordered.  His beta-blocker was continued.   He was seen in the emergency department at [facility] on January 24, 2023. HRs reported as 110-120s and described as "sinus tach" in the notes.    Today he was seen with his wife in clinic.  He reported doing poorly for the last couple of weeks with episodes of diaphoresis, lightheadedness, palpitations.  The wife also described a concerning series of events since January.  He has had extreme anger, emotional incontinence with inappropriate and unexplained laughter and crying. He has had some work up for  this, including normal brain MRI in 10/2019.  EKG showed SVT in 200s, and was sent to ER via EMS. Adenosine 6, 12 and 12 given en-route with no effect (confirmed verbally directly with Attending Paramedic).  BP in 80s on arrival and was thus sedated and urgently cardioverted.   He has been struggling sensation of "being in AF". Which at times is skipped beats, at times in the 100s, and at times > 200. He has had 3 distinct episodes of very fast HR. Today, in January, and 1 year ago. Reports mostly "laying around the house in March." Also reports as  above periods of emotional instability.    Labs Potassium3.8 (07/31 1541) Magnesium  1.9 (07/31 1541) Creatinine, ser  1.45* (07/31 1541) PLT  233 (07/31 1541) HGB  16.0 (07/31 1541) WBC 10.2 (07/31 1541) Troponin I (High Sensitivity)6 (07/31 1541).    Past Medical History:  Diagnosis Date   CHF (congestive heart failure) (HCC)    Chronic combined systolic and diastolic CHF (congestive heart failure) (HCC)    Cirrhosis (HCC)    liver scarring on biopsy, presumed to be due to methotrexate   Depression    DJD (degenerative joint disease)    Essential hypertension 08/30/2013   GERD (gastroesophageal reflux disease)    not present   Gout    Hives    Hypercholesteremia    Hypertension    Mitral regurgitation - type I dysfunction - mild to moderate    Obesity  OSA (obstructive sleep apnea)    mild, uses CPAP   Paroxysmal atrial fibrillation (HCC)    Persistent atrial fibrillation (HCC) 03/02/2012   Psoriasis    S/P Minimally invasive maze operation for atrial fibrillation 03/20/2016   Complete bilateral atrial lesion set using cryothermy and bipolar radiofrequency ablation via right mini thoracotomy approach with oversewing of LA appendage   Shortness of breath dyspnea    exertion   Syncope 02/21/2014   Thrombocytopenia (HCC)    TIA (transient ischemic attack)      Surgical History:  Past Surgical History:  Procedure Laterality Date    ABLATION OF DYSRHYTHMIC FOCUS  09/17/2012   ATRIAL FIBRILLATION ABLATION  03/17/12   PVI and CTI ablation by Dr [name]   ATRIAL FIBRILLATION ABLATION N/A 03/17/2012   Procedure: ATRIAL FIBRILLATION ABLATION;  Surgeon: Hillis Range, MD;  Location: [facility] CATH LAB;  Service: Cardiovascular;  Laterality: N/A;   ATRIAL FIBRILLATION ABLATION N/A 09/18/2012   Procedure: ATRIAL FIBRILLATION ABLATION;  Surgeon: Hillis Range, MD;  Location: [facility] CATH LAB;  Service: Cardiovascular;  Laterality: N/A;   CARDIAC CATHETERIZATION N/A 01/25/2016   Procedure: Right/Left Heart Cath and Coronary Angiography;  Surgeon: Laurey Morale, MD;  Location: [facility] INVASIVE CV LAB;  Service: Cardiovascular;  Laterality: N/A;   CARDIOVERSION N/A 10/09/2012   Procedure: CARDIOVERSION;  Surgeon: Laurey Morale, MD;  Location: [facility] ENDOSCOPY;  Service: Cardiovascular;  Laterality: N/A;   CARDIOVERSION N/A 02/22/2014   Procedure: CARDIOVERSION;  Surgeon: Quintella Reichert, MD;  Location: [facility] ENDOSCOPY;  Service: Cardiovascular;  Laterality: N/A;   CARDIOVERSION N/A 12/29/2015   Procedure: CARDIOVERSION;  Surgeon: Hillis Range, MD;  Location: [facility] OR;  Service: Cardiovascular;  Laterality: N/A;   CARDIOVERSION N/A 05/15/2018   Procedure: CARDIOVERSION;  Surgeon: [name], MD;  Location: [facility] ENDOSCOPY;  Service: Cardiovascular;  Laterality: N/A;   CHOLECYSTECTOMY  2011   ELBOW ARTHROSCOPY Left 08/2014   ELECTROPHYSIOLOGIC STUDY N/A 12/29/2015   Procedure: Cardioversion;  Surgeon: Hillis Range, MD;  Location: [facility] INVASIVE CV LAB;  Service: Cardiovascular;  Laterality: N/A;   EYE SURGERY Bilateral 04/2015   cataract surgery   KNEE ARTHROSCOPY Right    MINIMALLY INVASIVE MAZE PROCEDURE N/A 03/20/2016   Procedure: MINIMALLY INVASIVE MAZE PROCEDURE;  Surgeon: Purcell Nails, MD;  Location: [facility] OR;  Service: Open Heart Surgery;  Laterality: N/A;   NOSE SURGERY     Turbinates   RECONSTRUCTION OF NOSE     TEE WITHOUT CARDIOVERSION  03/16/2012    Procedure: TRANSESOPHAGEAL ECHOCARDIOGRAM (TEE);  Surgeon: [name], MD;  Location: [facility] ENDOSCOPY;  Service: Cardiovascular;  Laterality: N/A;   TEE WITHOUT CARDIOVERSION N/A 09/17/2012   Procedure: TRANSESOPHAGEAL ECHOCARDIOGRAM (TEE);  Surgeon: Lewayne Bunting, MD;  Location: [facility] ENDOSCOPY;  Service: Cardiovascular;  Laterality: N/A;   TEE WITHOUT CARDIOVERSION N/A 01/25/2016   Procedure: TRANSESOPHAGEAL ECHOCARDIOGRAM (TEE);  Surgeon: Laurey Morale, MD;  Location: [facility] ENDOSCOPY;  Service: Cardiovascular;  Laterality: N/A;   TEE WITHOUT CARDIOVERSION N/A 03/20/2016   Procedure: TRANSESOPHAGEAL ECHOCARDIOGRAM (TEE);  Surgeon: Purcell Nails, MD;  Location: [facility] OR;  Service: Open Heart Surgery;  Laterality: N/A;   TEE WITHOUT CARDIOVERSION N/A 05/15/2018   Procedure: TRANSESOPHAGEAL ECHOCARDIOGRAM (TEE);  Surgeon: [name], MD;  Location: [facility] ENDOSCOPY;  Service: Cardiovascular;  Laterality: N/A;   TENDON REPAIR     Right Forearm   TENDON REPAIR Right 1990's   forearm     (Not in a hospital admission)   Inpatient Medications:  Allergies:  Allergies  Allergen Reactions   Penicillins Rash    Has patient had a PCN reaction causing immediate rash, facial/tongue/throat swelling, SOB or lightheadedness with hypotension: Yes Has patient had a PCN reaction causing severe rash involving mucus membranes or skin necrosis: No Has patient had a PCN reaction that required hospitalization No Has patient had a PCN reaction occurring within the last 10 years: No If all of the above answers are "NO", then may proceed with Cephalosporin use.     Family History  Problem Relation Age of Onset   Heart attack Father    Heart disease Other    Diabetes Other      Physical Exam: Vitals:   01/29/23 1555 01/29/23 1600 01/29/23 1605 01/29/23 1606  BP: 101/87 (!) 82/69 124/78   Pulse:   (!) 202 95  Resp: 18 (!) 21 12 14   SpO2:   100% 100%    GEN- NAD, A&O x 3, normal affect HEENT:  Normocephalic, atraumatic Lungs- CTAB, Normal effort.  Heart- Regular rate and rhythm, No M/G/R.  GI- Soft, NT, ND.  Extremities- No clubbing, cyanosis, or edema   Radiology/Studies: No results found.  EKG:on arrival to office and ED shows SVT > 200 bpm (personally reviewed)  TELEMETRY: SVT > 200 -> cardioversion and now NSR in 90s (personally reviewed)  Assessment/Plan:  Atrial fibrillation s/p ablation 2013, 2014 and MAZE 2017 Atypical AFL vs SVT EKG today with narrow complex tachycardia > 200 bpm.  As part of EP study in 2014 ventricular pacing was performed, which revealed midline decremental VA conduction with a VA Wenckebach cycle length of 300 msec.  He has been on amiodarone and stopped for unclear reasons. He was on flecainide with good results for at least 3 years prior to his 2013 ablation.  No history of being on tikosyn or sotalol.  Potassium3.8 (07/31 1541) Magnesium  1.9 (07/31 1541) Creatinine, ser  1.45* (07/31 1541) Keep K > 4.0 and Mg > 2.0   Inappropriate laughing/Crying Mood instability Has had some work up including MRI, which was unremarkable.  Can consider neuro consult vs outpatient follow up as work up is on-going.  He does see neurology as outpatient. He did stop his Zoloft at some point. Cannot remember the reason.   Will plan on observation and discussion of AAD +/- EP study. Continue metoprolol  For questions or updates, please contact [facility] Please consult [email or web address] for contact info under Cardiology/STEMI.  Dustin Flock, PA-C  01/29/2023 4:49 PM    Long RP tachycardia  Palpitations recurrent?  Recurrent atrial fibrillation  Prior atrial fibrillation ablation x 2 and maze surgery-stand-alone  Weight loss associated with anorexia  Mood instability/irritability?  Depression prior history  Patient has had 3 episodes of long RP tachycardia in the setting of an EP study which failed to demonstrate an accessory  pathway.  Differential diagnosis would include atypical AV nodal reentry and that atrial tachycardia, with his prior ablations, likely micro reentry.  The lack of response to adenosine on 3 occasions with support the latter diagnosis.  These have been quite discombobulated but infrequent.  We discussed the role of antiarrhythmic suppression (see below)  We hope to obtain records from EMS if there were any pauses with adenosine administration>>  theis could be helpful diagnositically   Patient also has multiple palpitations.  He thinks he is in atrial fibrillation, PACs PVCs are all noted on the monitor and these have been symptomatically problematic and antiarrhythmic suppression for  the SVT may well medically address his other palpitations for since hence, we have agreed to proceed at this juncture with antiarrhythmic therapy.  Previously nonobstructive coronary disease previously normal LV function.  Will recheck his echocardiogram but will begin with flecainide 100 twice daily maintaining his metoprolol and his anticoagulation  We have discussed the possibility of going to the EP lab for diagnosis of the tachycardia but given the psychiatric instability (see below again) would defer sedation and hence the procedure for now.  No weight loss reported has been in the setting of anorexia.  This is occurred in the context of disability marked by increasing irritability and lassitude.  With his prior history of depression therapy, it raises the issue as to whether this is neuropsychiatric and have advised him to follow-up with the family family physician to consider these issues.  With the emotional lability, there may also be a primary neurological concern.

## 2023-01-30 ENCOUNTER — Other Ambulatory Visit: Payer: Self-pay

## 2023-01-30 ENCOUNTER — Observation Stay (HOSPITAL_BASED_OUTPATIENT_CLINIC_OR_DEPARTMENT_OTHER): Payer: BC Managed Care – PPO

## 2023-01-30 ENCOUNTER — Other Ambulatory Visit (HOSPITAL_COMMUNITY): Payer: Self-pay

## 2023-01-30 DIAGNOSIS — I10 Essential (primary) hypertension: Secondary | ICD-10-CM

## 2023-01-30 DIAGNOSIS — I471 Supraventricular tachycardia, unspecified: Secondary | ICD-10-CM

## 2023-01-30 SURGERY — ELECTROPHYSIOLOGY STUDY
Anesthesia: General

## 2023-01-30 MED ORDER — FLECAINIDE ACETATE 100 MG PO TABS
100.0000 mg | ORAL_TABLET | Freq: Two times a day (BID) | ORAL | 6 refills | Status: DC
  Filled 2023-01-30: qty 60, 30d supply, fill #0

## 2023-01-30 NOTE — Progress Notes (Signed)
  Pt doing OK this am.   EKG shows NSR at 71 bpm, PR interval 180 ms, QRS 89 ms     Plan for echo this am and this if remains stable will plan for d/c with 7-10 day OV for EKG, then 1 month f/u in office.   Casimiro Needle [address] [place], PA-C  01/30/2023 8:08 AM

## 2023-01-30 NOTE — ED Notes (Signed)
ED TO INPATIENT HANDOFF REPORT  ED Nurse Name and Phone #: [phone]  S Name/Age/Gender Joseph Garrett 62 y.o. male Room/Bed: 008C/008C  Code Status   Code Status: Full Code  Home/SNF/Other Home Patient oriented to: self, place, time, and situation Is this baseline? Yes   Triage Complete: Triage complete  Chief Complaint SVT (supraventricular tachycardia) [I47.10]  Triage Note PT BIB EMS from his cardiologist office for being in SVT, rate of 220.    Adenosine, 6 mg Adenosine 12 mg Adenosine 12 Zofran 4 mg  18 L Hand      Allergies Allergies  Allergen Reactions   Penicillins Rash    Level of Care/Admitting Diagnosis ED Disposition     ED Disposition  Admit   Condition  --   Comment  Hospital Area: [facility] [[id number]]  Level of Care: Telemetry Cardiac [[id number]]  May place patient in observation at [facility] or [facility] if equivalent level of care is available:: No  Covid Evaluation: Asymptomatic - no recent exposure (last 10 days) testing not required  Diagnosis: SVT (supraventricular tachycardia) [454098]  Admitting Physician: Gabriela Eves  Attending Physician: Lanier Prude [[id number]]          B Medical/Surgery History Past Medical History:  Diagnosis Date   CHF (congestive heart failure) (HCC)    Chronic combined systolic and diastolic CHF (congestive heart failure) (HCC)    Cirrhosis (HCC)    liver scarring on biopsy, presumed to be due to methotrexate   Depression    DJD (degenerative joint disease)    Essential hypertension 08/30/2013   GERD (gastroesophageal reflux disease)    not present   Gout    Hives    Hypercholesteremia    Hypertension    Mitral regurgitation - type I dysfunction - mild to moderate    Obesity    OSA (obstructive sleep apnea)    mild, uses CPAP   Paroxysmal atrial fibrillation (HCC)    Persistent atrial fibrillation (HCC) 03/02/2012   Psoriasis    S/P Minimally invasive maze  operation for atrial fibrillation 03/20/2016   Complete bilateral atrial lesion set using cryothermy and bipolar radiofrequency ablation via right mini thoracotomy approach with oversewing of LA appendage   Shortness of breath dyspnea    exertion   Syncope 02/21/2014   Thrombocytopenia (HCC)    TIA (transient ischemic attack)    Past Surgical History:  Procedure Laterality Date   ABLATION OF DYSRHYTHMIC FOCUS  09/17/2012   ATRIAL FIBRILLATION ABLATION  03/17/12   PVI and CTI ablation by Dr [name]   ATRIAL FIBRILLATION ABLATION N/A 03/17/2012   Procedure: ATRIAL FIBRILLATION ABLATION;  Surgeon: Hillis Range, MD;  Location: [facility] CATH LAB;  Service: Cardiovascular;  Laterality: N/A;   ATRIAL FIBRILLATION ABLATION N/A 09/18/2012   Procedure: ATRIAL FIBRILLATION ABLATION;  Surgeon: Hillis Range, MD;  Location: [facility] CATH LAB;  Service: Cardiovascular;  Laterality: N/A;   CARDIAC CATHETERIZATION N/A 01/25/2016   Procedure: Right/Left Heart Cath and Coronary Angiography;  Surgeon: Laurey Morale, MD;  Location: [facility] INVASIVE CV LAB;  Service: Cardiovascular;  Laterality: N/A;   CARDIOVERSION N/A 10/09/2012   Procedure: CARDIOVERSION;  Surgeon: Laurey Morale, MD;  Location: [facility] ENDOSCOPY;  Service: Cardiovascular;  Laterality: N/A;   CARDIOVERSION N/A 02/22/2014   Procedure: CARDIOVERSION;  Surgeon: Quintella Reichert, MD;  Location: [facility] ENDOSCOPY;  Service: Cardiovascular;  Laterality: N/A;   CARDIOVERSION N/A 12/29/2015   Procedure: CARDIOVERSION;  Surgeon: Hillis Range, MD;  Location: [facility] OR;  Service: Cardiovascular;  Laterality: N/A;   CARDIOVERSION N/A 05/15/2018   Procedure: CARDIOVERSION;  Surgeon: [name], MD;  Location: [facility] ENDOSCOPY;  Service: Cardiovascular;  Laterality: N/A;   CHOLECYSTECTOMY  2011   ELBOW ARTHROSCOPY Left 08/2014   ELECTROPHYSIOLOGIC STUDY N/A 12/29/2015   Procedure: Cardioversion;  Surgeon: Hillis Range, MD;  Location: [facility] INVASIVE CV LAB;  Service: Cardiovascular;  Laterality:  N/A;   EYE SURGERY Bilateral 04/2015   cataract surgery   KNEE ARTHROSCOPY Right    MINIMALLY INVASIVE MAZE PROCEDURE N/A 03/20/2016   Procedure: MINIMALLY INVASIVE MAZE PROCEDURE;  Surgeon: Purcell Nails, MD;  Location: [facility] OR;  Service: Open Heart Surgery;  Laterality: N/A;   NOSE SURGERY     Turbinates   RECONSTRUCTION OF NOSE     TEE WITHOUT CARDIOVERSION  03/16/2012   Procedure: TRANSESOPHAGEAL ECHOCARDIOGRAM (TEE);  Surgeon: [name], MD;  Location: [facility] ENDOSCOPY;  Service: Cardiovascular;  Laterality: N/A;   TEE WITHOUT CARDIOVERSION N/A 09/17/2012   Procedure: TRANSESOPHAGEAL ECHOCARDIOGRAM (TEE);  Surgeon: Lewayne Bunting, MD;  Location: [facility] ENDOSCOPY;  Service: Cardiovascular;  Laterality: N/A;   TEE WITHOUT CARDIOVERSION N/A 01/25/2016   Procedure: TRANSESOPHAGEAL ECHOCARDIOGRAM (TEE);  Surgeon: Laurey Morale, MD;  Location: [facility] ENDOSCOPY;  Service: Cardiovascular;  Laterality: N/A;   TEE WITHOUT CARDIOVERSION N/A 03/20/2016   Procedure: TRANSESOPHAGEAL ECHOCARDIOGRAM (TEE);  Surgeon: Purcell Nails, MD;  Location: [facility] OR;  Service: Open Heart Surgery;  Laterality: N/A;   TEE WITHOUT CARDIOVERSION N/A 05/15/2018   Procedure: TRANSESOPHAGEAL ECHOCARDIOGRAM (TEE);  Surgeon: [name], MD;  Location: [facility] ENDOSCOPY;  Service: Cardiovascular;  Laterality: N/A;   TENDON REPAIR     Right Forearm   TENDON REPAIR Right 1990's   forearm     A IV Location/Drains/Wounds Patient Lines/Drains/Airways Status     Active Line/Drains/Airways     Name Placement date Placement time Site Days   Peripheral IV 01/29/23 20 G 1" Anterior;Distal;Right;Upper Arm 01/29/23  1550  Arm  1            Intake/Output Last 24 hours  Intake/Output Summary (Last 24 hours) at 01/30/2023 0820 Last data filed at 01/30/2023 0636 Gross per 24 hour  Intake 50 ml  Output 2500 ml  Net -2450 ml    Labs/Imaging Results for orders placed or performed during the hospital encounter of 01/29/23 (from the  past 48 hour(s))  CBC with Differential     Status: Abnormal   Collection Time: 01/29/23  3:41 PM  Result Value Ref Range   WBC 10.2 4.0 - 10.5 K/uL   RBC 5.03 4.22 - 5.81 MIL/uL   Hemoglobin 16.0 13.0 - 17.0 g/dL   HCT 19.1 47.8 - 29.5 %   MCV 91.3 80.0 - 100.0 fL   MCH 31.8 26.0 - 34.0 pg   MCHC 34.9 30.0 - 36.0 g/dL   RDW 62.1 30.8 - 65.7 %   Platelets 233 150 - 400 K/uL   nRBC 0.0 0.0 - 0.2 %   Neutrophils Relative % 67 %   Neutro Abs 6.8 1.7 - 7.7 K/uL   Lymphocytes Relative 22 %   Lymphs Abs 2.3 0.7 - 4.0 K/uL   Monocytes Relative 9 %   Monocytes Absolute 1.0 0.1 - 1.0 K/uL   Eosinophils Relative 1 %   Eosinophils Absolute 0.1 0.0 - 0.5 K/uL   Basophils Relative 0 %   Basophils Absolute 0.0 0.0 - 0.1 K/uL   Immature Granulocytes 1 %  Abs Immature Granulocytes 0.08 (H) 0.00 - 0.07 K/uL    Comment: Performed at [facility] Lab, [address] [place], [place] [address]  Comprehensive metabolic panel     Status: Abnormal   Collection Time: 01/29/23  3:41 PM  Result Value Ref Range   Sodium 138 135 - 145 mmol/L   Potassium 3.8 3.5 - 5.1 mmol/L   Chloride 108 98 - 111 mmol/L   CO2 16 (L) 22 - 32 mmol/L   Glucose, Bld 119 (H) 70 - 99 mg/dL    Comment: Glucose reference range applies only to samples taken after fasting for at least 8 hours.   BUN 19 8 - 23 mg/dL   Creatinine, Ser 6.30 (H) 0.61 - 1.24 mg/dL   Calcium 8.8 (L) 8.9 - 10.3 mg/dL   Total Protein 6.1 (L) 6.5 - 8.1 g/dL   Albumin 3.9 3.5 - 5.0 g/dL   AST 28 15 - 41 U/L   ALT 28 0 - 44 U/L   Alkaline Phosphatase 62 38 - 126 U/L   Total Bilirubin 2.3 (H) 0.3 - 1.2 mg/dL   GFR, Estimated 54 (L) >60 mL/min    Comment: (NOTE) Calculated using the CKD-EPI Creatinine Equation (2021)    Anion gap 14 5 - 15    Comment: Performed at [facility] Lab, [address], [place], [place] [address]  Troponin I (High Sensitivity)     Status: None   Collection Time: 01/29/23  3:41 PM  Result Value Ref Range    Troponin I (High Sensitivity) 6 <18 ng/L    Comment: (NOTE) Elevated high sensitivity troponin I (hsTnI) values and significant  changes across serial measurements may suggest ACS but many other  chronic and acute conditions are known to elevate hsTnI results.  Refer to the "Links" section for chest pain algorithms and additional  guidance. Performed at [facility], [address], [place], [place] [address]   Magnesium     Status: None   Collection Time: 01/29/23  3:41 PM  Result Value Ref Range   Magnesium 1.9 1.7 - 2.4 mg/dL    Comment: Performed at [facility] Lab, [address] [place], [place] [address]  HIV Antibody (routine testing w rflx)     Status: None   Collection Time: 01/29/23  6:24 PM  Result Value Ref Range   HIV Screen 4th Generation wRfx Non Reactive Non Reactive    Comment: Performed at [facility] Lab, [address] [place], [place] [address]  CBC     Status: Abnormal   Collection Time: 01/29/23  6:24 PM  Result Value Ref Range   WBC 8.6 4.0 - 10.5 K/uL   RBC 4.38 4.22 - 5.81 MIL/uL   Hemoglobin 14.0 13.0 - 17.0 g/dL   HCT 70.6 23.7 - 62.8 %   MCV 92.5 80.0 - 100.0 fL   MCH 32.0 26.0 - 34.0 pg   MCHC 34.6 30.0 - 36.0 g/dL   RDW 31.5 17.6 - 16.0 %   Platelets 141 (L) 150 - 400 K/uL    Comment: REPEATED TO VERIFY   nRBC 0.0 0.0 - 0.2 %    Comment: Performed at [facility] Lab, [address] [place], [place] [address]  Creatinine, serum     Status: None   Collection Time: 01/29/23  6:24 PM  Result Value Ref Range   Creatinine, Ser 1.24 0.61 - 1.24 mg/dL   GFR, Estimated >62 >69 mL/min  Comment: (NOTE) Calculated using the CKD-EPI Creatinine Equation (2021) Performed at [facility] Lab, [address], [place], [place] [address]   Troponin I (High Sensitivity)     Status: None   Collection Time: 01/29/23  6:24 PM  Result Value Ref Range   Troponin I (High Sensitivity) 11 <18 ng/L    Comment: (NOTE) Elevated high sensitivity  troponin I (hsTnI) values and significant  changes across serial measurements may suggest ACS but many other  chronic and acute conditions are known to elevate hsTnI results.  Refer to the "Links" section for chest pain algorithms and additional  guidance. Performed at [facility] Lab, [address], [place], [place] [address]    No results found.  Pending Labs Unresulted Labs (From admission, onward)     Start     Ordered   02/05/23 0500  Creatinine, serum  (Pharmacologic VTE prophylaxis)  Weekly,   R     Comments: while on enoxaparin therapy    01/29/23 1650   01/30/23 0500  Basic metabolic panel  Tomorrow morning,   R        01/29/23 1650            Vitals/Pain Today's Vitals   01/30/23 0430 01/30/23 0555 01/30/23 0800 01/30/23 0810  BP:  (!) 144/94  (!) 146/98  Pulse:  74 68 79  Resp:  18 20 (!) 23  Temp:  98.3 F (36.8 C)    TempSrc:  Oral    SpO2:  98% 99% 100%  PainSc: Asleep 0-No pain      Isolation Precautions No active isolations  Medications Medications  acetaminophen (TYLENOL) tablet 650 mg (has no administration in time range)  ondansetron (ZOFRAN) injection 4 mg (has no administration in time range)  meloxicam (MOBIC) tablet 15 mg (has no administration in time range)  lisinopril (ZESTRIL) tablet 5 mg (has no administration in time range)  amLODipine (NORVASC) tablet 5 mg (has no administration in time range)  rivaroxaban (XARELTO) tablet 20 mg (20 mg Oral Given 01/29/23 2228)  flecainide (TAMBOCOR) tablet 100 mg (100 mg Oral Given 01/29/23 2227)  metoprolol succinate (TOPROL-XL) 24 hr tablet 25 mg (25 mg Oral Given 01/29/23 1834)  zolpidem (AMBIEN) tablet 5 mg (5 mg Oral Given 01/29/23 2228)  etomidate (AMIDATE) injection 5 mg (5 mg Intravenous Given 01/29/23 1603)  magnesium sulfate IVPB 2 g 50 mL (0 g Intravenous Stopped 01/29/23 2022)  potassium chloride SA (KLOR-CON M) CR tablet 40 mEq (40 mEq Oral Given 01/29/23 1818)    Mobility walks      Focused Assessments Cardiac Assessment Handoff:  Cardiac Rhythm: Normal sinus rhythm Lab Results  Component Value Date   TROPONINI 1.57 (HH) 05/14/2018   No results found for: "DDIMER" Does the Patient currently have chest pain? No    R Recommendations: See Admitting Provider Note  Report given to:   Additional Notes:

## 2023-01-30 NOTE — Discharge Summary (Signed)
ELECTROPHYSIOLOGY DISCHARGE SUMMARY    Patient ID: Joseph Garrett,  MRN: [id number], DOB/AGE: May 07, 1961 62 y.o.  Admit date: 01/29/2023 Discharge date: 01/30/2023  Primary Care Physician: Chip Boer, PA-C  Primary Cardiologist: Hillis Range, MD (Inactive)  Electrophysiologist: Dr. Lalla Brothers   Primary Discharge Diagnosis:  SVT Possible Atrial flutter  Secondary Discharge Diagnosis:  Mood instability  Procedures This Admission:  Echocardiogram 01/29/2023 1. Left ventricular ejection fraction, by estimation, is 50 to 55%. The  left ventricle has low normal function. The left ventricle has no regional wall motion abnormalities. The left ventricular internal cavity size was mildly dilated. Left ventricular diastolic parameters were normal.   2. Right ventricular systolic function is mildly reduced. The right  ventricular size is mildly enlarged. There is normal pulmonary artery  systolic pressure.   3. Left atrial size was mildly dilated.   4. Right atrial size was mildly dilated.   5. The mitral valve is normal in structure. Trivial mitral valve  regurgitation. No evidence of mitral stenosis.   6. The aortic valve is normal in structure. Aortic valve regurgitation is  not visualized. No aortic stenosis is present.   7. The inferior vena cava is normal in size with greater than 50%  respiratory variability, suggesting right atrial pressure of 3 mmHg.    Brief HPI: Joseph Garrett is a 62 y.o. male with a history of HN, HLD, OSA, CPAP, AF s/p ablation 2013 and redo 2014, MAZE 2017, AFL, on Xarelto for CVA prophylaxis admitted from the office when he presented in SVT  Hospital Course:  The patient was admitted from the office via EMS after presenting with SVT > 200 bpm. NOT responsive to adenosine 6, 12, 12. With hypotension was urgently cardioverted in the ED. After further discussion, he was started to flecainide and observed overnight  They were monitored on telemetry  overnight which demonstrated NSR.   The patient was examined and considered to be stable for discharge.  Wound care and restrictions were reviewed with the patient.  The patient will be seen back by EP APP in 4 weeks for post hospital care, with an RN visit and EKG in 7-10 days.  EP study was discussed, but with ongoing mood swings and instability, felt best to defer sedation at this time.  Pt is to follow up with his PCP and neurologist as an outpatient, as some work up is on-going.   Physical Exam: Vitals:   01/30/23 0810 01/30/23 0918 01/30/23 0942 01/30/23 1150  BP: (!) 146/98 (!) 136/95 138/86 122/86  Pulse: 79 75 84 68  Resp: (!) 23 20 20 19   Temp:   97.8 F (36.6 C) 98.1 F (36.7 C)  TempSrc:   Oral Oral  SpO2: 100% 99% 99% 98%  Weight:   120.1 kg   Height:   6\' 2"  (1.88 m)     GEN- NAD. A&O x 3.  HEENT: Normocephalic, atraumatic Lungs- CTAB, Normal effort.  Heart- RRR, No M/G/R.  GI- Soft, NT, ND.  Extremities- No clubbing, cyanosis, or edema; Groin without hematoma   Discharge Medications:  Allergies as of 01/30/2023       Reactions   Penicillins Rash        Medication List     TAKE these medications    acetaminophen 500 MG tablet Commonly known as: TYLENOL Take 1,000 mg by mouth 2 (two) times daily as needed for moderate pain, fever or headache.   amLODipine 5 MG tablet Commonly known as: NORVASC  Take 5 mg by mouth at bedtime.   flecainide 100 MG tablet Commonly known as: TAMBOCOR Take 1 tablet (100 mg total) by mouth every 12 (twelve) hours.   halobetasol 0.05 % cream Commonly known as: ULTRAVATE Apply 0.5 g topically 2 (two) times daily as needed (psoriasis).   lisinopril 5 MG tablet Commonly known as: ZESTRIL Take 20 mg by mouth at bedtime.   meloxicam 15 MG tablet Commonly known as: MOBIC Take 15 mg by mouth daily as needed for pain.   metoprolol succinate 25 MG 24 hr tablet Commonly known as: TOPROL-XL Take 25 mg by mouth at bedtime.    rivaroxaban 20 MG Tabs tablet Commonly known as: XARELTO Take 1 tablet (20 mg total) by mouth daily with supper.   zolpidem 10 MG tablet Commonly known as: AMBIEN Take 10 mg by mouth at bedtime as needed for sleep.        Disposition:  Discharge Instructions     Amb referral to AFIB Clinic   Complete by: As directed        Follow-up Information     [facility] Follow up.   Specialty: Cardiology Why: on 8/8 at 2 pm for post flecainide start EKG Contact information: [address], [address] [place] [address] [phone]        Graciella Freer, PA-C Follow up.   Specialty: Cardiology Why: on 9/5 at 1200 noon for follow up Contact information: [address] [place] [address] [phone]                 Duration of Discharge Encounter: Greater than 30 minutes including physician time.  Dustin Flock, PA-C  01/30/2023 1:27 PM

## 2023-01-30 NOTE — Progress Notes (Signed)
  Echocardiogram 2D Echocardiogram has been performed.  Joseph Garrett 01/30/2023, 9:07 AM

## 2023-01-30 NOTE — TOC Initial Note (Signed)
Transition of Care [facility]) - Initial/Assessment Note    Patient Details  Name: Joseph Garrett MRN: [id number] Date of Birth: Jul 07, 1960  Transition of Care [facility]) CM/SW Contact:    Leone Haven, RN Phone Number: 01/30/2023, 1:41 PM  Clinical Narrative:                 From home with wife,pta ambulates with cane sometimes, he has PCP , Dr. [facility]ce Pagan at [facility].  He states he currently does not have any HH services in place at this time.  He does have a Medical laboratory scientific officer, his wife will transport him home at [employer] and she is his support system, TOC to fill medication for him prior to dc.    Expected Discharge Plan: Home/Self Care Barriers to Discharge: No Barriers Identified   Patient Goals and CMS Choice Patient states their goals for this hospitalization and ongoing recovery are:: return home   Choice offered to / list presented to : NA      Expected Discharge Plan and Services In-house Referral: NA Discharge Planning Services: CM Consult Post Acute Care Choice: NA Living arrangements for the past 2 months: Single Family Home Expected Discharge Date: 01/30/23                 DME Agency: NA       HH Arranged: NA          Prior Living Arrangements/Services Living arrangements for the past 2 months: Single Family Home Lives with:: Spouse Patient language and need for interpreter reviewed:: Yes Do you feel safe going back to the place where you live?: Yes      Need for Family Participation in Patient Care: Yes (Comment) Care giver support system in place?: Yes (comment) Current home services: DME (cane) Criminal Activity/Legal Involvement Pertinent to Current Situation/Hospitalization: No - Comment as needed  Activities of Daily Living      Permission Sought/Granted Permission sought to share information with : Case Manager Permission granted to share information with : Yes, Verbal Permission Granted              Emotional Assessment Appearance:: Appears stated  age Attitude/Demeanor/Rapport: Engaged Affect (typically observed): Appropriate Orientation: : Oriented to Self, Oriented to Place, Oriented to  Time, Oriented to Situation Alcohol / Substance Use: Not Applicable Psych Involvement: No (comment)  Admission diagnosis:  Cardiogenic shock (HCC) [R57.0] SVT (supraventricular tachycardia) [I47.10] Patient Active Problem List   Diagnosis Date Noted   CAD (coronary artery disease) 02/16/2020   Atrial flutter with rapid ventricular response (HCC) 02/15/2020   AKI (acute kidney injury) (HCC) 05/13/2018   URI (upper respiratory infection) 05/13/2018   SVT (supraventricular tachycardia) 05/13/2018   Hypotension 04/22/2016   S/P Minimally invasive maze operation for atrial fibrillation 03/20/2016   Mitral regurgitation - type I dysfunction - mild to moderate    Abnormal PFTs (pulmonary function tests) 04/28/2015   Chronic combined systolic and diastolic CHF (congestive heart failure) (HCC)    Syncope 02/21/2014   NICM (nonischemic cardiomyopathy) (HCC) 08/30/2013   Sinus bradycardia 08/30/2013   Fatigue 08/30/2013   Essential hypertension 08/30/2013   Persistent atrial fibrillation (HCC) 03/02/2012   Hives    Hypersomnia    SOB (shortness of breath)    Sleep apnea    Hypercholesteremia    Thrombocytopenia (HCC)    OSA (obstructive sleep apnea)    Obesity    Hyperglycemia    Anxiety    Gout    PCP:  Chip Boer, PA-C  Pharmacy:   [facility] - [place], [place] - [address] [place] [address] Phone: [phone] Fax: [phone]  [facility] - [place], [place] - [address] [place] [address] Phone: [phone] Fax: [phone]  [facility] [address] [place], [place] - [address] [place] [address] Phone: [phone] Fax: [phone]  [facility]acy [address] [place] [address] Phone: [phone] Fax: [phone]     Social Determinants of Health (SDOH) Social History: SDOH Screenings   Food Insecurity: No Food Insecurity (10/25/2021)   Received from [facility], [facility]  Transportation Needs: No Transportation Needs (07/30/2022)   Received from [facility], [facility]  Financial Resource Strain: Low Risk  (07/29/2022)   Received from [facility], [facility]  Social Connections: Unknown (10/30/2021)   Received from [facility], [facility]  Stress: No Stress Concern Present (07/31/2022)   Received from [facility], [facility]  Tobacco Use: Low Risk  (01/29/2023)   SDOH Interventions:     Readmission Risk Interventions     No data to display

## 2023-01-30 NOTE — TOC Transition Note (Signed)
Transition of Care [facility]) - CM/SW Discharge Note   Patient Details  Name: Joseph Garrett MRN: [id number] Date of Birth: 10/04/1960  Transition of Care [facility]) CM/SW Contact:  Leone Haven, RN Phone Number: 01/30/2023, 1:44 PM   Clinical Narrative:    Patient is for dc , wife to transport, TOC to fill meds.   Final next level of care: Home/Self Care Barriers to Discharge: No Barriers Identified   Patient Goals and CMS Choice   Choice offered to / list presented to : NA  Discharge Placement                         Discharge Plan and Services Additional resources added to the After Visit Summary for   In-house Referral: NA Discharge Planning Services: CM Consult Post Acute Care Choice: NA            DME Agency: NA       HH Arranged: NA          Social Determinants of Health (SDOH) Interventions SDOH Screenings   Food Insecurity: No Food Insecurity (10/25/2021)   Received from [facility], [facility]  Transportation Needs: No Transportation Needs (07/30/2022)   Received from [facility], [facility]  Financial Resource Strain: Low Risk  (07/29/2022)   Received from [facility], [facility]  Social Connections: Unknown (10/30/2021)   Received from [facility], [facility]  Stress: No Stress Concern Present (07/31/2022)   Received from [facility], [facility]  Tobacco Use: Low Risk  (01/29/2023)     Readmission Risk Interventions     No data to display

## 2023-02-06 ENCOUNTER — Ambulatory Visit: Payer: BC Managed Care – PPO | Attending: Cardiology

## 2023-02-06 VITALS — BP 142/90 | HR 62 | Ht 74.0 in | Wt 271.2 lb

## 2023-02-06 DIAGNOSIS — I4819 Other persistent atrial fibrillation: Secondary | ICD-10-CM | POA: Diagnosis not present

## 2023-02-06 DIAGNOSIS — I471 Supraventricular tachycardia, unspecified: Secondary | ICD-10-CM | POA: Diagnosis not present

## 2023-02-06 NOTE — Progress Notes (Signed)
   Nurse Visit   Date of Encounter: 02/06/2023 ID: RAVI KAUK, DOB 1961/01/11, MRN [id number]  PCP:  Chip Boer, PA-C   [facility] Providers Cardiologist:  Hillis Range, MD (Inactive) Electrophysiologist:  Lanier Prude, MD      Visit Details   VS:  BP (!) 142/90   Pulse 62   Ht 6\' 2"  (1.88 m)   Wt 271 lb 3.2 oz (123 kg)   SpO2 98%   BMI 34.82 kg/m  , BMI Body mass index is 34.82 kg/m.  Wt Readings from Last 3 Encounters:  02/06/23 271 lb 3.2 oz (123 kg)  01/30/23 264 lb 12.4 oz (120.1 kg)  01/29/23 267 lb (121.1 kg)     Reason for visit: EKG for Flecainide start Performed today: Vitals, EKG, Provider consulted:Dr. Mayford Knife (DOD, and Education Changes (medications, testing, etc.) : none Length of Visit: 20 minutes    Medications Adjustments/Labs and Tests Ordered: Orders Placed This Encounter  Procedures   EKG 12-Lead   No orders of the defined types were placed in this encounter.  EKG reviewed by Dr Mayford Knife. No new orders received.  Signed, Eilleen Kempf, RN  02/06/2023 2:32 PM

## 2023-02-06 NOTE — Patient Instructions (Signed)
Medication Instructions:  Your physician recommends that you continue on your current medications as directed. Please refer to the Current Medication list given to you today.  *If you need a refill on your cardiac medications before your next appointment, please call your pharmacy   Follow-Up: At [facility], you and your health needs are our priority.  As part of our continuing mission to provide you with exceptional heart care, we have created designated Provider Care Teams.  These Care Teams include your primary Cardiologist (physician) and Advanced Practice Providers (APPs -  Physician Assistants and Nurse Practitioners) who all work together to provide you with the care you need, when you need it.  Your next appointment:   As scheduled  

## 2023-02-10 ENCOUNTER — Telehealth: Payer: Self-pay | Admitting: Cardiology

## 2023-02-10 NOTE — Telephone Encounter (Signed)
Pt c/o BP issue: STAT if pt c/o blurred vision, one-sided weakness or slurred speech  1. What are your last 5 BP readings?   155/72  HR 62 148/84  61 158/86  64 128/74  ??  2. Are you having any other symptoms (ex. Dizziness, headache, blurred vision, passed out)?   Terrible headache and can't see up close  3. What is your BP issue?   Patient stated his BP has been trending high and wants to know next steps.

## 2023-02-10 NOTE — Telephone Encounter (Signed)
Pt was calling to check on the status of him getting a callback today from nurse regarding his BP. Please advise

## 2023-02-10 NOTE — Telephone Encounter (Signed)
Patient reports that on Friday he did have another episode of SVT. It resolved after a couple of hours. He states that since yesterday his blood pressure has been elevated and he has not been feeling good. He reports that he has had a headache and little energy. He states that he did take a Toprol XL 50 mg this morning. He states that his blood pressure did come down a little bit but is now back up at 160/80. He states that he has not taken any of his evening medications yet. I advised him to go ahead and take medications and continue to monitor.

## 2023-02-12 NOTE — Telephone Encounter (Signed)
Spoke with the patient who states that he thinks he was having a headache from lack of caffeine and his headache was driving his blood pressure up. He reports that his blood pressure this morning was 153/80. He did take a Toprol 50 mg. He has not checked his blood pressure since however he is feeling much better. He would like to know if he is okay to continue with the Toprol 50 mg.

## 2023-02-17 MED ORDER — METOPROLOL SUCCINATE ER 50 MG PO TB24: 50.0000 mg | ORAL_TABLET | Freq: Every day | ORAL | 3 refills | Status: DC

## 2023-02-17 NOTE — Telephone Encounter (Signed)
Advised patient to continue with Toprol 50 mg daily. New prescription has been sent in.  He reports blood pressures are better controlled now. He believes caffeine was increasing his pressures and is avoiding it now.

## 2023-03-06 ENCOUNTER — Ambulatory Visit: Payer: BC Managed Care – PPO | Attending: Student | Admitting: Student

## 2023-03-06 ENCOUNTER — Encounter: Payer: Self-pay | Admitting: Student

## 2023-03-06 VITALS — BP 100/72 | HR 53 | Ht 74.0 in | Wt 274.8 lb

## 2023-03-06 DIAGNOSIS — I471 Supraventricular tachycardia, unspecified: Secondary | ICD-10-CM

## 2023-03-06 DIAGNOSIS — I4819 Other persistent atrial fibrillation: Secondary | ICD-10-CM

## 2023-03-06 DIAGNOSIS — R002 Palpitations: Secondary | ICD-10-CM | POA: Diagnosis not present

## 2023-03-06 DIAGNOSIS — I10 Essential (primary) hypertension: Secondary | ICD-10-CM | POA: Diagnosis not present

## 2023-03-06 MED ORDER — FLECAINIDE ACETATE 100 MG PO TABS: 100.0000 mg | ORAL_TABLET | Freq: Two times a day (BID) | ORAL | 3 refills | Status: DC

## 2023-03-06 MED ORDER — METOPROLOL SUCCINATE ER 50 MG PO TB24: ORAL_TABLET | ORAL | 3 refills | Status: AC

## 2023-03-06 NOTE — Progress Notes (Addendum)
  Electrophysiology Office Note:   Date:  03/06/2023  ID:  DWRIGHT HABEL, DOB 06-16-1961, MRN [id number]  Primary Cardiologist: Hillis Range, MD (Inactive) Electrophysiologist: Lanier Prude, MD      History of Present Illness:   Joseph Garrett is a 62 y.o. male with h/o HTN, HLD, OSA, CPAP, AF s/p ablations, MAZE 2017, AFL, on Carelto for CVA prophylaxis seen today for post hospital follow up.    Admitted 7/31 - 8/1 after presenting to the office with a short RP tachycardia without accessory pathway on prior EP study. Felt to be atypical AV nodal reentry or AT, felt to potentially be micro re-entry. Not responsive to adenosine. Started on flecainide with stabilization.   Since discharge from hospital the patient reports doing well overall. He ran out of flecainide earlier this week because the refills were not transferred. He has had 3-4 episodes of SVT since starting flecainide; Longest lasted a couple of hours. Symptoms are the same, when he has SOB, lightheadedness, and fatigue when he is out of rhythm. No syncope or chest pain.   Review of systems complete and found to be negative unless listed in HPI.   EP Information / Studies Reviewed:    EKG is ordered today. Personal review as below.  EKG Interpretation Date/Time:  Thursday March 06 2023 12:10:05 EDT Ventricular Rate:  51 PR Interval:  114 QRS Duration:  88 QT Interval:  458 QTC Calculation: 422 R Axis:   -1  Text Interpretation: Sinus bradycardia Stable intervals on flecainide Confirmed by Maxine Glenn [phone]) on 03/06/2023 12:14:12 PM    Arrhythmia History S/p Ablation 2013 Redo 2014 MAZE 2017 Flecainide started 01/29/2023 -> present  Physical Exam:   VS:  BP 100/72   Pulse (!) 53   Ht 6\' 2"  (1.88 m)   Wt 274 lb 12.8 oz (124.6 kg)   SpO2 98%   BMI 35.28 kg/m    Wt Readings from Last 3 Encounters:  03/06/23 274 lb 12.8 oz (124.6 kg)  02/06/23 271 lb 3.2 oz (123 kg)  01/30/23 264 lb 12.4 oz (120.1  kg)     GEN: Well nourished, well developed in no acute distress NECK: No JVD; No carotid bruits CARDIAC: Regular rate and rhythm, no murmurs, rubs, gallops RESPIRATORY:  Clear to auscultation without rales, wheezing or rhonchi  ABDOMEN: Soft, non-tender, non-distended EXTREMITIES:  No edema; No deformity   ASSESSMENT AND PLAN:    SVT EKG today shows NSR with stable intervals. Increase toprol to 50 mg at bedtime and 25 q am.  Continue flecainide as below  Persistent Atrial Fibrillation  S/p Ablation 2013 and redo 2014 EKG today shows NSR with stable intervals Continue Xarelto 20mg  for CHA2DS2VASC of at least 5 Continue Flecainide 100 mg BID   Secondary hypercoagulable state Pt on Xarelto as above   Inappropriate laughing/Crying Mood instability Following with neuro and PCP as outpatient.   HTN Stable on current regimen   Cardiac clearance for knee surgery The patient is cleared for surgery from a cardiac perspective with an estimated Low Risk of perioperative cardiac complications by the Revised Cardiac Risk Index Nedra Hai Criteria). The patient may proceed without further cardiac work up.  The patient does not have an implanted cardiac device.   Given mood instability and inappropriate laughing / crying at times, would additionally recommend medical clearance for sedation from his PCP.   Follow up with Dr. Lalla Brothers in 3-4 months.   Signed, Graciella Freer, PA-C

## 2023-03-06 NOTE — Patient Instructions (Signed)
Medication Instructions:  Increase metoprolol succinate (Toprol XL) to 25 mg every morning and 50 mg daily at bedtime. *If you need a refill on your cardiac medications before your next appointment, please call your pharmacy*  Lab Work: None ordered If you have labs (blood work) drawn today and your tests are completely normal, you will receive your results only by: MyChart Message (if you have MyChart) OR A paper copy in the mail If you have any lab test that is abnormal or we need to change your treatment, we will call you to review the results.  Follow-Up: At [facility], you and your health needs are our priority.  As part of our continuing mission to provide you with exceptional heart care, we have created designated Provider Care Teams.  These Care Teams include your primary Cardiologist (physician) and Advanced Practice Providers (APPs -  Physician Assistants and Nurse Practitioners) who all work together to provide you with the care you need, when you need it.  Your next appointment:   3-4 month(s)  Provider:   Steffanie Dunn, MD

## 2023-03-07 ENCOUNTER — Telehealth: Payer: Self-pay | Admitting: *Deleted

## 2023-03-07 NOTE — Telephone Encounter (Signed)
Patient with diagnosis of afib on Xarelto for anticoagulation.    Procedure: Right Total Knee Replacement  Date of procedure: TBD   CHA2DS2-VASc Score = 3   This indicates a 3.2% annual risk of stroke. The patient's score is based upon: CHF History: 1 HTN History: 1 Diabetes History: 0 Stroke History: 0 Vascular Disease History: 1 Age Score: 0 Gender Score: 0      CrCl 105 ml/min Platelet count 141  Per office protocol, patient can hold Xarelto for 3 days prior to procedure.   Patient should resume Xarelto 24 hours post procedure. He should be on 20mg  daily, not DVT prophylaxis dosing.  **This guidance is not considered finalized until pre-operative APP has relayed final recommendations.**

## 2023-03-07 NOTE — Telephone Encounter (Signed)
   Pre-operative Risk Assessment    Patient Name: JERSIAH HANBY  DOB: 04/30/1961 MRN: [id number]      Request for Surgical Clearance    Procedure:   Right Total Knee Replacement  Date of Surgery:  Clearance TBD                                 Surgeon:  Not Indicated. Surgeon's Group or Practice Name:  [facility]  Phone number:  [phone] Fax number:  [phone]   Type of Clearance Requested:   - Medical  - Pharmacy:  Hold Apixaban (Eliquis) 3 days prior and 2 weeks after.   Type of Anesthesia:  Spinal   Additional requests/questions:  Pt had appointment with Otilio Saber, PA on September 5,2024.  Signed, Emmit Pomfret   03/07/2023, 9:28 AM

## 2023-03-07 NOTE — Telephone Encounter (Signed)
   Patient Name: Joseph Garrett  DOB: [birth year] MRN: [id number]  Primary Cardiologist: Hillis Range, MD (Inactive)  Chart reviewed as part of pre-operative protocol coverage. Given past medical history and time since last visit, based on ACC/AHA guidelines, Joseph Garrett is at acceptable risk for the planned procedure without further cardiovascular testing.   Cardiac clearance for knee surgery The patient is cleared for surgery from a cardiac perspective with an estimated Low Risk of perioperative cardiac complications by the Revised Cardiac Risk Index Nedra Hai Criteria). The patient may proceed without further cardiac work up.  The patient does not have an implanted cardiac device.   Given mood instability and inappropriate laughing / crying at times, would additionally recommend medical clearance for sedation from his PCP.   Per Pharmacy CHA2DS2-VASc Score = 3   This indicates a 3.2% annual risk of stroke. The patient's score is based upon: CHF History: 1 HTN History: 1 Diabetes History: 0 Stroke History: 0 Vascular Disease History: 1 Age Score: 0 Gender Score: 0   Per office protocol, patient can hold Xarelto for 3 days prior to procedure.   Patient should resume Xarelto 24 hours post procedure. He should be on 20mg  daily, not DVT prophylaxis dosing.  I will route this recommendation to the requesting party via Epic fax function and remove from pre-op pool.  Please call with questions.  Joni Reining, NP 03/07/2023, 2:05 PM

## 2023-03-07 NOTE — Telephone Encounter (Signed)
Pharmacy please advise on holding Xarelto prior to Right Knee Replacement. Request mentions Eliquis but patient is on Xarelto. He is scheduled for TBD.  Saw Otilio Saber on 03/06/2023 but did not mention holding anticoagulation, was cleared from CV standpoint. . Thank you.

## 2023-03-14 ENCOUNTER — Telehealth: Payer: Self-pay | Admitting: Cardiology

## 2023-03-14 DIAGNOSIS — Z0279 Encounter for issue of other medical certificate: Secondary | ICD-10-CM

## 2023-03-14 NOTE — Telephone Encounter (Signed)
Requesting office sent duplicate request. I will fax over the clearance notes and the recommendations per pre op PAC as well as the pre op pharm-d.

## 2023-03-14 NOTE — Telephone Encounter (Signed)
Patient has filled out the necessary documentation for completion of his disability forms and has paid the $29.00 feel.  I have placed the red folder in the basket at the front desk.  Thank you.

## 2023-03-17 ENCOUNTER — Telehealth: Payer: Self-pay | Admitting: Cardiology

## 2023-03-17 NOTE — Telephone Encounter (Signed)
Form in Dr. Lovena Neighbours box

## 2023-03-17 NOTE — Telephone Encounter (Signed)
Patient dropped off and paid for disability forms and will be in Dr. Lovena Neighbours box by the end of the day. Thank you.

## 2023-03-21 ENCOUNTER — Telehealth: Payer: Self-pay | Admitting: Cardiology

## 2023-03-21 NOTE — Telephone Encounter (Signed)
See 9/16 telephone note

## 2023-03-21 NOTE — Telephone Encounter (Signed)
Duplicate.  See 9/16 telephone note

## 2023-03-21 NOTE — Telephone Encounter (Signed)
Called to discuss disability paperwork received.   Pt states that it was for his knee. He dropped it off at the wrong office.  Pt will stop by our office next week to pick up paperwork and get money back. He appreciates my call. Jacki Cones at front desk notified.

## 2023-03-21 NOTE — Telephone Encounter (Signed)
Patient is calling stating he is returning RN, Sherri's call. Please advise.

## 2023-03-26 NOTE — Telephone Encounter (Signed)
I spoke with Mr. Joseph Garrett this morning regarding his disability form.  He said that [employer] want all of his physicians to complete a form.  I am putting the form back in Dr. Lovena Neighbours box to be completed by him.  Patient did state that his source of disability has nothing to do with his heart, however, the insurance company want to hear from all of his providers.

## 2023-03-27 NOTE — Telephone Encounter (Signed)
Completed [employer] form faxed to insurance.  Patient and billing notified.

## 2023-03-27 NOTE — Telephone Encounter (Signed)
Forms have been completed and placed up front

## 2023-04-02 ENCOUNTER — Telehealth: Payer: Self-pay | Admitting: Cardiology

## 2023-04-02 NOTE — Telephone Encounter (Signed)
Patient is calling to speak to Dr. Gershon Crane RN and did not want to give further details.

## 2023-04-02 NOTE — Telephone Encounter (Signed)
Patient states that Dr. Abbe Amsterdam is needing further clearance on his xarelto dosage prior to his knee surgery.  Advised that they would need to send [place] a new clearance request with the updated information on xarelto dosage. He states that they were supposed to fax something yesterday. Advised that I would have our pre-op look for it. Patient verbalized understanding.

## 2023-04-04 ENCOUNTER — Telehealth: Payer: Self-pay | Admitting: Cardiology

## 2023-04-04 NOTE — Telephone Encounter (Signed)
Pt c/o medication issue:  1. Name of Medication:   flecainide (TAMBOCOR) 100 MG tablet    2. How are you currently taking this medication (dosage and times per day)? Take 1 tablet (100 mg total) by mouth every 12 (twelve) hours.   3. Are you having a reaction (difficulty breathing--STAT)? No  4. What is your medication issue?Patient is calling because this medication has been causing him to become itchy. Patient would like a different medication. Please advise.

## 2023-04-04 NOTE — Telephone Encounter (Signed)
Spoke with pt and has been itching for 3-4 weeks since starting the Flecainide all over body no other changes in detergent or eating any different foods Also pt had increase in Metoprolol as well Will forward to Otilio Saber PA for review and recommendations./cy

## 2023-04-04 NOTE — Telephone Encounter (Signed)
Patient returned RN's call. 

## 2023-04-04 NOTE — Telephone Encounter (Signed)
None of his meds are known to cause itching as a common side effect. Could technically have an allergic reaction to any med that could include itching, but that would be much more likely if he had recently started a new medication and he hasn't. Agree with Andy's recs to follow up with PCP for further evaluation.

## 2023-04-04 NOTE — Telephone Encounter (Signed)
Pt stated started med 3-4 weeks ago and symptoms started .

## 2023-04-04 NOTE — Telephone Encounter (Signed)
Lm to call back ./cy 

## 2023-04-04 NOTE — Telephone Encounter (Signed)
Left message for patient to call back  

## 2023-04-04 NOTE — Telephone Encounter (Signed)
Spoke with pt and is not sure of the timeline on itching he does state it has worsened over the last 2 weeks and pt notes break out after itching Will Forward to [employer] PA .Zack Seal

## 2023-04-07 NOTE — Telephone Encounter (Signed)
Patient returned RN Carlyle's call.

## 2023-04-07 NOTE — Telephone Encounter (Signed)
Spoke with the patient and gave advisement from PharmD and Mardelle Matte. Patient verbalized understanding.

## 2023-04-08 ENCOUNTER — Telehealth: Payer: Self-pay | Admitting: Cardiology

## 2023-04-08 NOTE — Telephone Encounter (Signed)
Returned call to pt and advised that clearance has been sent to his surgeon's office.  I did read what pharm d wrote about Xarelto, holing X's 3 days, and resuming 24 hours post surgery.  Pt aware he should be on 20 mg Xarelto daily.  He confirms / understands.

## 2023-04-08 NOTE — Telephone Encounter (Signed)
Follow Up:      Patient is calling to check on the status of his clearance. Patient says he needs to know about stopping his medicine.

## 2023-04-10 ENCOUNTER — Telehealth: Payer: Self-pay | Admitting: Cardiology

## 2023-04-10 NOTE — Telephone Encounter (Signed)
Follow Up:      Patient called again. He says he needs to talk to the nurse about his paperwork for his surgery please.

## 2023-04-10 NOTE — Telephone Encounter (Signed)
S/w pt is aware this clearance has been sent X 2 to requesting surgeons office. The pt can hold xarelto per pharmacist 3 days prior and resume xarelto 24 hours after procedure. Called surgeons office the Pt advocate is out of office till Oct 21.  Milana Na is calling pt back to advise and this paperwork was given to Pre OP and explained to hold on to paperwork.  This clearance might have to go back to Dr. Lalla Brothers for review if the above protocol for xarelto is not sufficient enough. Will keep in pre op pool.

## 2023-04-10 NOTE — Telephone Encounter (Signed)
Called surgeon's office for clarification, left a message for Tiffany [phone]) to call back.

## 2023-04-11 NOTE — Telephone Encounter (Signed)
Office returning nurses phone call. Please advise

## 2023-04-15 NOTE — Telephone Encounter (Signed)
Caller (Tiffany) stated patient will need to be on half dose medication after her surgery.  Caller wants to confirm if patient can be on half dosage or notify patient he will have to have surgery locally.

## 2023-04-16 NOTE — Telephone Encounter (Signed)
I will forward this to the pre op APP to review the notes from the surgeon's office.

## 2023-04-16 NOTE — Telephone Encounter (Signed)
See clearance notes from 03/07/23.

## 2023-04-16 NOTE — Telephone Encounter (Signed)
I s/w Tiffany with surgeon's office. Tiffany stated that she has d/w the pt in great detail that the cardiologist is not comfortable with him being on a 1/2 dose of blood thinner post surgery, risk is too high for the pt who has A-fib. Tiffany states she d/w the pt that he would have to have surgery with a local surgeon as the risk for traveling after his surgery if done with their surgeon would too great for blood clot.   Tiffany, tells me the pt works for [employer] and that [employer] participates in a program for [employer] that their surgeries are pretty much free; (travel, hotel, surgery) all paid for.   Tiffany said at this time they are not going to be doing his surgery.

## 2023-07-14 ENCOUNTER — Encounter: Payer: Self-pay | Admitting: Cardiology

## 2023-07-14 ENCOUNTER — Ambulatory Visit: Payer: BC Managed Care – PPO | Attending: Cardiology | Admitting: Cardiology

## 2023-07-14 VITALS — BP 122/80 | HR 54 | Ht 74.0 in | Wt 295.0 lb

## 2023-07-14 DIAGNOSIS — I4819 Other persistent atrial fibrillation: Secondary | ICD-10-CM

## 2023-07-14 DIAGNOSIS — I471 Supraventricular tachycardia, unspecified: Secondary | ICD-10-CM | POA: Diagnosis not present

## 2023-07-14 DIAGNOSIS — Z79899 Other long term (current) drug therapy: Secondary | ICD-10-CM | POA: Diagnosis not present

## 2023-07-14 NOTE — Patient Instructions (Signed)
 Medication Instructions:  Your physician recommends that you continue on your current medications as directed. Please refer to the Current Medication list given to you today.  *If you need a refill on your cardiac medications before your next appointment, please call your pharmacy*  Follow-Up: At [facility], you and your health needs are our priority.  As part of our continuing mission to provide you with exceptional heart care, we have created designated Provider Care Teams.  These Care Teams include your primary Cardiologist (physician) and Advanced Practice Providers (APPs -  Physician Assistants and Nurse Practitioners) who all work together to provide you with the care you need, when you need it.   Your next appointment:   6 months  Provider:   You will see one of the following Advanced Practice Providers on your designated Care Team:   Francis Dowse, Charlott Holler [address]" [place], [place] Sherie Don, NP Canary Brim, NP

## 2023-07-14 NOTE — Progress Notes (Addendum)
 Electrophysiology Office Follow up Visit Note:    Date:  07/14/2023   ID:  Joseph Garrett, DOB 10-16-1960, MRN [id number]  PCP:  Ozell Peter, PA-C  [facility] Cardiologist:  Lynwood Rakers, MD (Inactive)  [facility] Electrophysiologist:  OLE ONEIDA HOLTS, MD    Interval History:     Joseph Garrett is a 63 y.o. male who presents for a follow up visit.   I saw him in July 2024 given his history of persistent atrial fibrillation.  He has a history of a maze operation with Dr. Dusty in 2017.  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 He saw Jodie in September 2024.  At that appointment his rhythm was stable.  He was continuing to take flecainide  and Xarelto  for his atrial fibrillation history. The patient was being worked up for knee replacement surgery.  In the preop evaluation, the surgeon requested 2 weeks of blood thinner hold following the surgery for unclear reasons. From chart review it appears that he is scheduled for knee replacement July 24, 2023.  Today he is doing well.  He is being evaluated for knee replacement surgery.  He continues to take his medications with good control of his atrial fibrillation.       Past medical, surgical, social and family history were reviewed.  ROS:   Please see the history of present illness.    All other systems reviewed and are negative.  EKGs/Labs/Other Studies Reviewed:    The following studies were reviewed today:   EKG Interpretation Date/Time:  Monday July 14 2023 14:25:06 EST Ventricular Rate:  54 PR Interval:  184 QRS Duration:  90 QT Interval:  462 QTC Calculation: 438 R Axis:   -8  Text Interpretation: Sinus bradycardia Confirmed by Holts Ole [id number]) on 07/14/2023 2:32:44 PM    Physical Exam:    VS:  BP 122/80 (BP Location: Left Arm, Patient Position: Sitting, Cuff Size: Large)   Pulse (!) 54   Ht 6' 2 (1.88 m)   Wt 295 lb (133.8 kg)   SpO2 97%   BMI 37.88 kg/m      Wt Readings from Last 3 Encounters:  07/14/23 295 lb (133.8 kg)  03/06/23 274 lb 12.8 oz (124.6 kg)  02/06/23 271 lb 3.2 oz (123 kg)     GEN: no distress CARD: RRR, No MRG RESP: No IWOB. CTAB.      ASSESSMENT:    1. SVT (supraventricular tachycardia) (HCC)   2. Persistent atrial fibrillation (HCC)   3. Encounter for long-term (current) use of high-risk medication    PLAN:    In order of problems listed above:  #Persistent atrial fibrillation #High risk med monitoring-flecainide  On Xarelto  for stroke prophylaxis Taking flecainide .  PR and QRS durations acceptable for continued flecainide  use on today's EKG. Continue Xarelto , metoprolol  and flecainide   #PreOperative Risk Stratification Mr. Mino's perioperative risk of a major cardiac event is 0.4% according to the Revised Cardiac Risk Index (RCRI).  Therefore, he is at low risk for perioperative complications.  Recommendations: According to ACC/AHA guidelines, no further cardiovascular testing needed.  The patient may proceed to surgery at acceptable risk.   Antiplatelet and/or Anticoagulation Recommendations: Xarelto  (Rivaroxaban ) can be held for 3 days prior to surgery.  Please resume post op when felt to be safe.    CHA2DS2-VASc Score = 3  The patient's score is based upon: CHF History: 1 HTN History: 1 Diabetes History: 0 Stroke History: 0 Vascular Disease History: 1 Age  Score: 0 Gender Score: 0  Signed, Ole Holts, MD, [facility], [facility] 07/14/2023 2:40 PM    Electrophysiology [facility]

## 2023-07-18 ENCOUNTER — Emergency Department (HOSPITAL_COMMUNITY)
Admission: EM | Admit: 2023-07-18 | Discharge: 2023-07-18 | Disposition: A | Discharge status: Home / Self Care | Payer: BC Managed Care – PPO | Attending: Emergency Medicine | Admitting: Emergency Medicine

## 2023-07-18 ENCOUNTER — Other Ambulatory Visit: Payer: Self-pay

## 2023-07-18 ENCOUNTER — Telehealth: Payer: Self-pay | Admitting: Cardiology

## 2023-07-18 ENCOUNTER — Encounter (HOSPITAL_COMMUNITY): Payer: Self-pay | Admitting: Emergency Medicine

## 2023-07-18 ENCOUNTER — Telehealth: Payer: Self-pay | Admitting: Physician Assistant

## 2023-07-18 ENCOUNTER — Emergency Department (HOSPITAL_COMMUNITY): Payer: BC Managed Care – PPO

## 2023-07-18 DIAGNOSIS — I480 Paroxysmal atrial fibrillation: Secondary | ICD-10-CM

## 2023-07-18 DIAGNOSIS — I5042 Chronic combined systolic (congestive) and diastolic (congestive) heart failure: Secondary | ICD-10-CM | POA: Diagnosis not present

## 2023-07-18 DIAGNOSIS — I2510 Atherosclerotic heart disease of native coronary artery without angina pectoris: Secondary | ICD-10-CM | POA: Insufficient documentation

## 2023-07-18 DIAGNOSIS — Z7901 Long term (current) use of anticoagulants: Secondary | ICD-10-CM | POA: Diagnosis not present

## 2023-07-18 DIAGNOSIS — I110 Hypertensive heart disease with heart failure: Secondary | ICD-10-CM | POA: Diagnosis not present

## 2023-07-18 DIAGNOSIS — R002 Palpitations: Secondary | ICD-10-CM | POA: Diagnosis present

## 2023-07-18 DIAGNOSIS — Z79899 Other long term (current) drug therapy: Secondary | ICD-10-CM | POA: Insufficient documentation

## 2023-07-18 LAB — CBC
HCT: 47.9 % (ref 39.0–52.0)
Hemoglobin: 16.2 g/dL (ref 13.0–17.0)
MCH: 31.8 pg (ref 26.0–34.0)
MCHC: 33.8 g/dL (ref 30.0–36.0)
MCV: 94.1 fL (ref 80.0–100.0)
Platelets: 209 10*3/uL (ref 150–400)
RBC: 5.09 MIL/uL (ref 4.22–5.81)
RDW: 13.2 % (ref 11.5–15.5)
WBC: 9.5 10*3/uL (ref 4.0–10.5)
nRBC: 0 % (ref 0.0–0.2)

## 2023-07-18 LAB — BASIC METABOLIC PANEL
Anion gap: 10 (ref 5–15)
BUN: 19 mg/dL (ref 8–23)
CO2: 26 mmol/L (ref 22–32)
Calcium: 9.5 mg/dL (ref 8.9–10.3)
Chloride: 105 mmol/L (ref 98–111)
Creatinine, Ser: 1.19 mg/dL (ref 0.61–1.24)
GFR, Estimated: >60 mL/min (ref >60)
Glucose, Bld: 112 mg/dL — ABNORMAL HIGH (ref 70–99)
Potassium: 4.5 mmol/L (ref 3.5–5.1)
Sodium: 141 mmol/L (ref 135–145)

## 2023-07-18 LAB — MAGNESIUM: Magnesium: 2.4 mg/dL (ref 1.7–2.4)

## 2023-07-18 LAB — BRAIN NATRIURETIC PEPTIDE: B Natriuretic Peptide: 215.4 pg/mL — ABNORMAL HIGH (ref 0.0–100.0)

## 2023-07-18 LAB — TROPONIN I (HIGH SENSITIVITY): Troponin I (High Sensitivity): 8 ng/L (ref <18)

## 2023-07-18 MED ORDER — ADENOSINE 6 MG/2ML IV SOLN
12.0000 mg | Freq: Once | INTRAVENOUS | Status: DC
  Filled 2023-07-18: qty 4

## 2023-07-18 MED ORDER — FENTANYL CITRATE PF 50 MCG/ML IJ SOSY
50.0000 ug | PREFILLED_SYRINGE | Freq: Once | INTRAMUSCULAR | Status: AC
  Administered 2023-07-18: 50 ug via INTRAVENOUS
  Filled 2023-07-18: qty 1

## 2023-07-18 MED ORDER — ETOMIDATE 2 MG/ML IV SOLN
10.0000 mg | Freq: Once | INTRAVENOUS | Status: AC
  Administered 2023-07-18: 10 mg via INTRAVENOUS
  Filled 2023-07-18: qty 10

## 2023-07-18 NOTE — ED Notes (Signed)
Patient signed consent form for cardio version.

## 2023-07-18 NOTE — ED Notes (Signed)
Shocked at 200 joules.

## 2023-07-18 NOTE — ED Provider Notes (Signed)
[place] EMERGENCY DEPARTMENT AT [facility] Provider Note  CSN: [id number] Arrival date & time: 07/18/23 1847  Chief Complaint(s) Chest Pain and Shortness of Breath  HPI Joseph Garrett is a 63 y.o. male history of CHF, cirrhosis, hyperlipidemia, hypertension, atrial fibrillation on Xarelto, presenting to the emergency department with palpitations.  Patient reports palpitations starting today, associated with shortness of breath, chest pain.  Reports that this feels similar to previous episodes of SVT or A-fib.  He waited to see if it would get better on its own but it did not so he came to the emergency department.  No lightheadedness or dizziness, fainting.  No fevers or chills, cough, runny nose, sore throat, abdominal pain, painful urination, lightheadedness or dizziness, any other new symptoms.   Past Medical History Past Medical History:  Diagnosis Date   CHF (congestive heart failure) (HCC)    Chronic combined systolic and diastolic CHF (congestive heart failure) (HCC)    Cirrhosis (HCC)    liver scarring on biopsy, presumed to be due to methotrexate   Depression    DJD (degenerative joint disease)    Essential hypertension 08/30/2013   GERD (gastroesophageal reflux disease)    not present   Gout    Hives    Hypercholesteremia    Hypertension    Mitral regurgitation - type I dysfunction - mild to moderate    Obesity    OSA (obstructive sleep apnea)    mild, uses CPAP   Paroxysmal atrial fibrillation (HCC)    Persistent atrial fibrillation (HCC) 03/02/2012   Psoriasis    S/P Minimally invasive maze operation for atrial fibrillation 03/20/2016   Complete bilateral atrial lesion set using cryothermy and bipolar radiofrequency ablation via right mini thoracotomy approach with oversewing of LA appendage   Shortness of breath dyspnea    exertion   Syncope 02/21/2014   Thrombocytopenia (HCC)    TIA (transient ischemic attack)    Patient Active Problem List    Diagnosis Date Noted   CAD (coronary artery disease) 02/16/2020   Atrial flutter with rapid ventricular response (HCC) 02/15/2020   AKI (acute kidney injury) (HCC) 05/13/2018   URI (upper respiratory infection) 05/13/2018   SVT (supraventricular tachycardia) (HCC) 05/13/2018   Hypotension 04/22/2016   S/P Minimally invasive maze operation for atrial fibrillation 03/20/2016   Mitral regurgitation - type I dysfunction - mild to moderate    Abnormal PFTs (pulmonary function tests) 04/28/2015   Chronic combined systolic and diastolic CHF (congestive heart failure) (HCC)    Syncope 02/21/2014   NICM (nonischemic cardiomyopathy) (HCC) 08/30/2013   Sinus bradycardia 08/30/2013   Fatigue 08/30/2013   Essential hypertension 08/30/2013   Persistent atrial fibrillation (HCC) 03/02/2012   Hives    Hypersomnia    SOB (shortness of breath)    Sleep apnea    Hypercholesteremia    Thrombocytopenia (HCC)    OSA (obstructive sleep apnea)    Obesity    Hyperglycemia    Anxiety    Gout    Home Medication(s) Prior to Admission medications   Medication Sig Start Date End Date Taking? Authorizing Provider  acetaminophen (TYLENOL) 500 MG tablet Take 1,000 mg by mouth 2 (two) times daily as needed for moderate pain, fever or headache.   Yes [provider]  amLODipine (NORVASC) 5 MG tablet Take 5 mg by mouth at bedtime.   Yes [provider]  diphenhydrAMINE (BENADRYL) 25 MG tablet Take 50 mg by mouth daily.   Yes [provider]  flecainide (TAMBOCOR) 100 MG tablet Take 1 tablet (100 mg total) by mouth every 12 (twelve) hours. 03/06/23  Yes Graciella Freer, PA-C  halobetasol (ULTRAVATE) 0.05 % cream Apply 0.5 g topically 2 (two) times daily as needed (psoriasis). 02/05/18  Yes Coralee Rud, PA-C  lamoTRIgine (LAMICTAL) 150 MG tablet Take 150 mg by mouth daily. 06/17/23  Yes [provider]  MAGNESIUM MALATE PO Take 2 tablets by mouth daily.   Yes [provider]  Melatonin 10 MG TABS Take 20 mg by mouth at bedtime.   Yes [provider]  metoprolol succinate (TOPROL-XL) 50 MG 24 hr tablet Take 1/2 tablet (25 mg) by mouth every morning and 1 tablet by mouth daily at bedtime. 03/06/23  Yes Graciella Freer, PA-C  rivaroxaban (XARELTO) 20 MG TABS tablet Take 1 tablet (20 mg total) by mouth daily with supper. 05/15/18  Yes Zannie Cove, MD  sertraline (ZOLOFT) 100 MG tablet Take 200 mg by mouth daily. 07/14/23  Yes [provider]  simvastatin (ZOCOR) 20 MG tablet Take 20 mg by mouth daily. 05/21/23  Yes [provider]                                                                                                                                    Past Surgical History Past Surgical History:  Procedure Laterality Date   ABLATION OF DYSRHYTHMIC FOCUS  09/17/2012   ATRIAL FIBRILLATION ABLATION  03/17/12   PVI and CTI ablation by Dr [name]   ATRIAL FIBRILLATION ABLATION N/A 03/17/2012   Procedure: ATRIAL FIBRILLATION ABLATION;  Surgeon: Hillis Range, MD;  Location: [facility] CATH LAB;  Service: Cardiovascular;  Laterality: N/A;   ATRIAL FIBRILLATION ABLATION N/A 09/18/2012   Procedure: ATRIAL FIBRILLATION ABLATION;  Surgeon: Hillis Range, MD;  Location: [facility] CATH LAB;  Service: Cardiovascular;  Laterality: N/A;   CARDIAC CATHETERIZATION N/A 01/25/2016   Procedure: Right/Left Heart Cath and Coronary Angiography;  Surgeon: Laurey Morale, MD;  Location: [facility] INVASIVE CV LAB;  Service: Cardiovascular;  Laterality: N/A;   CARDIOVERSION N/A 10/09/2012   Procedure: CARDIOVERSION;  Surgeon: Laurey Morale, MD;  Location: [facility] ENDOSCOPY;  Service: Cardiovascular;  Laterality: N/A;   CARDIOVERSION N/A 02/22/2014   Procedure: CARDIOVERSION;  Surgeon: Quintella Reichert, MD;  Location: [facility] ENDOSCOPY;  Service: Cardiovascular;  Laterality: N/A;   CARDIOVERSION N/A 12/29/2015   Procedure: CARDIOVERSION;  Surgeon: Hillis Range, MD;  Location: [facility]  OR;  Service: Cardiovascular;  Laterality: N/A;   CARDIOVERSION N/A 05/15/2018   Procedure: CARDIOVERSION;  Surgeon: [name], MD;  Location: [facility] ENDOSCOPY;  Service: Cardiovascular;  Laterality: N/A;   CHOLECYSTECTOMY  2011   ELBOW ARTHROSCOPY Left 08/2014   ELECTROPHYSIOLOGIC STUDY N/A 12/29/2015   Procedure: Cardioversion;  Surgeon: Hillis Range, MD;  Location: [facility] INVASIVE CV LAB;  Service: Cardiovascular;  Laterality: N/A;   EYE SURGERY Bilateral 04/2015   cataract surgery  KNEE ARTHROSCOPY Right    MINIMALLY INVASIVE MAZE PROCEDURE N/A 03/20/2016   Procedure: MINIMALLY INVASIVE MAZE PROCEDURE;  Surgeon: Purcell Nails, MD;  Location: [facility] OR;  Service: Open Heart Surgery;  Laterality: N/A;   NOSE SURGERY     Turbinates   RECONSTRUCTION OF NOSE     TEE WITHOUT CARDIOVERSION  03/16/2012   Procedure: TRANSESOPHAGEAL ECHOCARDIOGRAM (TEE);  Surgeon: [name], MD;  Location: [facility] ENDOSCOPY;  Service: Cardiovascular;  Laterality: N/A;   TEE WITHOUT CARDIOVERSION N/A 09/17/2012   Procedure: TRANSESOPHAGEAL ECHOCARDIOGRAM (TEE);  Surgeon: Lewayne Bunting, MD;  Location: [facility] ENDOSCOPY;  Service: Cardiovascular;  Laterality: N/A;   TEE WITHOUT CARDIOVERSION N/A 01/25/2016   Procedure: TRANSESOPHAGEAL ECHOCARDIOGRAM (TEE);  Surgeon: Laurey Morale, MD;  Location: [facility] ENDOSCOPY;  Service: Cardiovascular;  Laterality: N/A;   TEE WITHOUT CARDIOVERSION N/A 03/20/2016   Procedure: TRANSESOPHAGEAL ECHOCARDIOGRAM (TEE);  Surgeon: Purcell Nails, MD;  Location: [facility] OR;  Service: Open Heart Surgery;  Laterality: N/A;   TEE WITHOUT CARDIOVERSION N/A 05/15/2018   Procedure: TRANSESOPHAGEAL ECHOCARDIOGRAM (TEE);  Surgeon: [name], MD;  Location: [facility] ENDOSCOPY;  Service: Cardiovascular;  Laterality: N/A;   TENDON REPAIR     Right Forearm   TENDON REPAIR Right 1990's   forearm   Family History Family History  Problem Relation Age of Onset   Heart attack Father    Heart disease Other    Diabetes  Other     Social History Social History   Tobacco Use   Smoking status: Never   Smokeless tobacco: Never  Vaping Use   Vaping status: Never Used  Substance Use Topics   Alcohol use: No   Drug use: No   Allergies Penicillins  Review of Systems Review of Systems  All other systems reviewed and are negative.   Physical Exam Vital Signs  I have reviewed the triage vital signs BP (!) 119/93 (BP Location: Right Arm)   Pulse (!) 58   Temp 98 F (36.7 C) (Oral)   Resp 20   SpO2 98%  Physical Exam Vitals and nursing note reviewed.  Constitutional:      General: He is not in acute distress.    Appearance: Normal appearance.  HENT:     Mouth/Throat:     Mouth: Mucous membranes are moist.  Eyes:     Conjunctiva/sclera: Conjunctivae normal.  Cardiovascular:     Rate and Rhythm: Regular rhythm. Tachycardia present.  Pulmonary:     Effort: Pulmonary effort is normal. No respiratory distress.     Breath sounds: Normal breath sounds.  Abdominal:     General: Abdomen is flat.     Palpations: Abdomen is soft.     Tenderness: There is no abdominal tenderness.  Musculoskeletal:     Right lower leg: No edema.     Left lower leg: No edema.  Skin:    General: Skin is warm and dry.     Capillary Refill: Capillary refill takes less than 2 seconds.  Neurological:     Mental Status: He is alert and oriented to person, place, and time. Mental status is at baseline.  Psychiatric:        Mood and Affect: Mood normal.        Behavior: Behavior normal.     ED Results and Treatments Labs (all labs ordered are listed, but only abnormal results are displayed) Labs Reviewed  BASIC METABOLIC PANEL - Abnormal; Notable for the following components:  Result Value   Glucose, Bld 112 (*)    All other components within normal limits  BRAIN NATRIURETIC PEPTIDE - Abnormal; Notable for the following components:   B Natriuretic Peptide 215.4 (*)    All other components within normal  limits  CBC  MAGNESIUM  TROPONIN I (HIGH SENSITIVITY)                                                                                                                          Radiology DG Chest Portable 1 View Result Date: 07/18/2023 CLINICAL DATA:  Chest pain, short of breath, palpitations EXAM: PORTABLE CHEST 1 VIEW COMPARISON:  02/15/2020 FINDINGS: Single frontal view of the chest demonstrates external defibrillator pads overlying the left chest. Cardiac silhouette is unremarkable. Chronic elevation the right hemidiaphragm. No acute airspace disease, effusion, or pneumothorax. No acute bony abnormalities. IMPRESSION: 1. No acute intrathoracic process. Electronically Signed   By: Sharlet Salina M.D.   On: 07/18/2023 20:43    Pertinent labs & imaging results that were available during my care of the patient were reviewed by me and considered in my medical decision making (see MDM for details).  Medications Ordered in ED Medications  etomidate (AMIDATE) injection 10 mg (10 mg Intravenous Given 07/18/23 2137)  fentaNYL (SUBLIMAZE) injection 50 mcg (50 mcg Intravenous Given 07/18/23 2136)                                                                                                                                     Procedures .Critical Care  Performed by: Lonell Grandchild, MD Authorized by: Lonell Grandchild, MD   Critical care provider statement:    Critical care time (minutes):  30   Critical care was necessary to treat or prevent imminent or life-threatening deterioration of the following conditions:  Cardiac failure   Critical care was time spent personally by me on the following activities:  Development of treatment plan with patient or surrogate, discussions with consultants, evaluation of patient's response to treatment, examination of patient, ordering and review of laboratory studies, ordering and review of radiographic studies, ordering and performing treatments and  interventions, pulse oximetry, re-evaluation of patient's condition and review of old charts .Cardioversion  Date/Time: 07/18/2023 11:02 PM  Performed by: Lonell Grandchild, MD Authorized by: Lonell Grandchild, MD   Consent:    Consent obtained:  Written and verbal  Consent given by:  Patient   Risks discussed:  Cutaneous burn, death, induced arrhythmia and pain   Alternatives discussed:  Rate-control medication and observation Universal protocol:    Patient identity confirmed:  Verbally with patient and arm band Pre-procedure details:    Cardioversion basis:  Elective   Rhythm:  Atrial fibrillation   Electrode placement:  Anterior-posterior Patient sedated: Yes. Refer to sedation procedure documentation for details of sedation.  Attempt one:    Cardioversion mode:  Synchronous   Shock (Joules):  200   Shock outcome:  Conversion to normal sinus rhythm Post-procedure details:    Patient status:  Awake   Patient tolerance of procedure:  Tolerated well, no immediate complications .Sedation  Date/Time: 07/18/2023 11:03 PM  Performed by: Lonell Grandchild, MD Authorized by: Lonell Grandchild, MD   Consent:    Consent obtained:  Verbal   Consent given by:  Patient   Risks discussed:  Allergic reaction, dysrhythmia, inadequate sedation, nausea, prolonged hypoxia resulting in organ damage, prolonged sedation necessitating reversal, respiratory compromise necessitating ventilatory assistance and intubation and vomiting   Alternatives discussed:  Analgesia without sedation, anxiolysis and regional anesthesia Universal protocol:    Procedure explained and questions answered to patient or proxy's satisfaction: yes     Relevant documents present and verified: yes     Test results available: yes     Imaging studies available: yes     Required blood products, implants, devices, and special equipment available: yes     Site/side marked: yes     Immediately prior to procedure, a  time out was called: yes     Patient identity confirmed:  Verbally with patient Indications:    Procedure necessitating sedation performed by:  Physician performing sedation Pre-sedation assessment:    Time since last food or drink:  8  hr   ASA classification: class 3 - patient with severe systemic disease     Mouth opening:  3 or more finger widths   Thyromental distance:  4 finger widths   Mallampati score:  I - soft palate, uvula, fauces, pillars visible   Neck mobility: normal     Pre-sedation assessments completed and reviewed: airway patency, cardiovascular function, hydration status, mental status, nausea/vomiting, pain level, respiratory function and temperature   A pre-sedation assessment was completed prior to the start of the procedure Immediate pre-procedure details:    Reassessment: Patient reassessed immediately prior to procedure     Reviewed: vital signs, relevant labs/tests and NPO status     Verified: bag valve mask available, emergency equipment available, intubation equipment available, IV patency confirmed, oxygen available and suction available   Procedure details (see MAR for exact dosages):    Preoxygenation:  Nasal cannula   Sedation:  Etomidate   Intended level of sedation: deep   Analgesia:  Fentanyl   Intra-procedure monitoring:  Blood pressure monitoring, cardiac monitor, continuous pulse oximetry, frequent LOC assessments, frequent vital sign checks and continuous capnometry   Intra-procedure events: none     Total Provider sedation time (minutes):  15 Post-procedure details:   A post-sedation assessment was completed following the completion of the procedure.   Attendance: Constant attendance by certified staff until patient recovered     Recovery: Patient returned to pre-procedure baseline     Post-sedation assessments completed and reviewed: airway patency, cardiovascular function, hydration status, mental status, nausea/vomiting, pain level, respiratory  function and temperature     Patient is stable for discharge or admission: yes  Procedure completion:  Tolerated well, no immediate complications   (including critical care time)  Medical Decision Making / ED Course   MDM:  63 year old male presenting to the emergency department with palpitations.  Patient well-appearing, physical exam with initially regular tachycardia.  Initially plan to trial adenosine, however heart rate slowed down, on repeat EKG showed atrial fibrillation.  Discussed risks and benefits of cardioversion with the patient has as well as the risks and benefits of sedation.  Patient signed consent.  Cardioversion was performed successfully.  Patient reports that he feels much better following this.  Patient understands importance of taking his anticoagulation and following up with cardiology.  Patient did have some chest pain.  It resolved after cardioversion.  Suspect this is related to his atrial fibrillation.  Patient appears euvolemic.  His chest x-ray is clear.  His troponin is negative.  Very low concern for pulmonary embolism, no recent travel, surgeries, hypoxia, tachycardia resolved after cardioversion.  Doubt dissection given resolution of symptoms with cardioversion, stable cardiomediastinal silhouette.  Will discharge patient to home. All questions answered. Patient comfortable with plan of discharge. Return precautions discussed with patient and specified on the after visit summary.       Additional history obtained: -Additional history obtained from spouse -External records from outside source obtained and reviewed including: Chart review including previous notes, labs, imaging, consultation notes including prior cardiology notes    Lab Tests: -I ordered, reviewed, and interpreted labs.   The pertinent results include:   Labs Reviewed  BASIC METABOLIC PANEL - Abnormal; Notable for the following components:      Result Value   Glucose, Bld 112  (*)    All other components within normal limits  BRAIN NATRIURETIC PEPTIDE - Abnormal; Notable for the following components:   B Natriuretic Peptide 215.4 (*)    All other components within normal limits  CBC  MAGNESIUM  TROPONIN I (HIGH SENSITIVITY)    Notable for mild BNP elevation  EKG   EKG Interpretation Date/Time:  Friday July 18 2023 19:36:13 EST Ventricular Rate:  155 PR Interval:    QRS Duration:  92 QT Interval:  308 QTC Calculation: 494 R Axis:   15  Text Interpretation: Supraventricular tachycardia Otherwise normal ECG When compared with ECG of 14-Jul-2023 14:25,  SVT now present Confirmed by Alvino Blood ([id number]) on 07/18/2023 7:59:00 PM         Imaging Studies ordered: I ordered imaging studies including CXR On my interpretation imaging demonstrates no acute process I independently visualized and interpreted imaging. I agree with the radiologist interpretation   Medicines ordered and prescription drug management: Meds ordered this encounter  Medications   DISCONTD: adenosine (ADENOCARD) 6 MG/2ML injection 12 mg   etomidate (AMIDATE) injection 10 mg   fentaNYL (SUBLIMAZE) injection 50 mcg    -I have reviewed the patients home medicines and have made adjustments as needed    Cardiac Monitoring: The patient was maintained on a cardiac monitor.  I personally viewed and interpreted the cardiac monitored which showed an underlying rhythm of: SVT _> Afib  Social Determinants of Health:  Diagnosis or treatment significantly limited by social determinants of health: obesity   Reevaluation: After the interventions noted above, I reevaluated the patient and found that their symptoms have improved  Co morbidities that complicate the patient evaluation  Past Medical History:  Diagnosis Date   CHF (congestive heart failure) (HCC)    Chronic combined systolic and diastolic CHF (congestive heart failure) (HCC)  Cirrhosis (HCC)    liver scarring on  biopsy, presumed to be due to methotrexate   Depression    DJD (degenerative joint disease)    Essential hypertension 08/30/2013   GERD (gastroesophageal reflux disease)    not present   Gout    Hives    Hypercholesteremia    Hypertension    Mitral regurgitation - type I dysfunction - mild to moderate    Obesity    OSA (obstructive sleep apnea)    mild, uses CPAP   Paroxysmal atrial fibrillation (HCC)    Persistent atrial fibrillation (HCC) 03/02/2012   Psoriasis    S/P Minimally invasive maze operation for atrial fibrillation 03/20/2016   Complete bilateral atrial lesion set using cryothermy and bipolar radiofrequency ablation via right mini thoracotomy approach with oversewing of LA appendage   Shortness of breath dyspnea    exertion   Syncope 02/21/2014   Thrombocytopenia (HCC)    TIA (transient ischemic attack)       Dispostion: Disposition decision including need for hospitalization was considered, and patient discharged from emergency department.    Final Clinical Impression(s) / ED Diagnoses Final diagnoses:  Paroxysmal atrial fibrillation (HCC)     This chart was dictated using voice recognition software.  Despite best efforts to proofread,  errors can occur which can change the documentation meaning.    Lonell Grandchild, MD 07/18/23 [phone]

## 2023-07-18 NOTE — ED Notes (Signed)
Pt tachy, RN Bobby at bedside silenced alarm

## 2023-07-18 NOTE — Telephone Encounter (Signed)
   The patient called the answering service after-hours today. He was calling to let our office know he is going to the ER. Called earlier today reporting issues with elevated HR. Anticipate standard ER workup, ER can call cardiology team on call if needed to consult. Will cc to Dr. Lalla Brothers as Lorain Childes.  Laurann Montana, PA-C

## 2023-07-18 NOTE — Telephone Encounter (Signed)
STAT if HR is under 50 or over 120 (normal HR is 60-100 beats per minute)  What is your heart rate? 149  Do you have a log of your heart rate readings (document readings)? No   Do you have any other symptoms? SVT, AFIB,

## 2023-07-18 NOTE — ED Notes (Signed)
Zoll Pads applied , EDP explained plan of care .

## 2023-07-18 NOTE — Discharge Instructions (Signed)
We evaluated you for your racing heart.  You were in SVT and then atrial fibrillation.  We performed a cardioversion and your heart is now in a normal rhythm.  Please continue to take your anticoagulation as prescribed by your cardiologist as well as your other medications.  Please call your cardiologist to schedule a follow-up appointment.  If you have any recurrent symptoms, please return to the emergency department for reassessment.

## 2023-07-18 NOTE — Telephone Encounter (Signed)
Call sent straight to triage. Patient complaining of having elevated HR 149 for most of the day. Patient has history of AFIB. Patient checked BP and HR while on the phone. Currently the HR is 75. Encourage patient to go to ED if HR continues to be high.

## 2023-07-18 NOTE — Progress Notes (Signed)
RT present for conscious sedation. No complications noted. 

## 2023-07-18 NOTE — ED Triage Notes (Signed)
PT presents with SOB, heart palpitations, left sided chest pain, nausea and gas but no diarrhea or vomiting.  and chest pain that started at 12pm today.

## 2023-08-11 ENCOUNTER — Telehealth: Payer: Self-pay | Admitting: Home Health

## 2023-08-11 NOTE — Telephone Encounter (Signed)
 Patient called after hour line, states he feels very dizzy today, reporting his respiration was 24 but this could be the heart rate too and he is not sure, pox 84%- 95%. He checked his BP now, initially machine said error, then he said his BP was 105/72 and HR 50s. I advised the patient to go to the ER as I am not able to assess him over the phone and he is unclear about his VS. He refused and said he will just wait until tomorrow. Again informed the patient that some of the VS he had reported is abnormal, he needs to be evaluated. He states "I don't believe they are accurate".

## 2023-08-12 NOTE — Telephone Encounter (Signed)
error 

## 2024-02-20 ENCOUNTER — Other Ambulatory Visit: Payer: Self-pay | Admitting: Student

## 2024-02-23 ENCOUNTER — Other Ambulatory Visit: Payer: Self-pay

## 2024-02-23 MED ORDER — FLECAINIDE ACETATE 100 MG PO TABS: 100.0000 mg | ORAL_TABLET | Freq: Two times a day (BID) | ORAL | 3 refills | Status: AC
